# Patient Record
Sex: Female | Born: 1945
Health system: Southern US, Community
[De-identification: ages and names within clinical notes are randomized; demographics above are authoritative.]

## PROBLEM LIST (undated history)

## (undated) DIAGNOSIS — N72 Inflammatory disease of cervix uteri: Secondary | ICD-10-CM

## (undated) DIAGNOSIS — C50919 Malignant neoplasm of unspecified site of unspecified female breast: Secondary | ICD-10-CM

## (undated) DIAGNOSIS — R208 Other disturbances of skin sensation: Secondary | ICD-10-CM

## (undated) DIAGNOSIS — C50311 Malignant neoplasm of lower-inner quadrant of right female breast: Secondary | ICD-10-CM

## (undated) DIAGNOSIS — Z9289 Personal history of other medical treatment: Secondary | ICD-10-CM

## (undated) DIAGNOSIS — F419 Anxiety disorder, unspecified: Secondary | ICD-10-CM

## (undated) DIAGNOSIS — E785 Hyperlipidemia, unspecified: Secondary | ICD-10-CM

## (undated) DIAGNOSIS — I10 Essential (primary) hypertension: Secondary | ICD-10-CM

## (undated) DIAGNOSIS — Z780 Asymptomatic menopausal state: Secondary | ICD-10-CM

## (undated) HISTORY — DX: Essential (primary) hypertension: I10

## (undated) HISTORY — DX: Malignant neoplasm of lower-inner quadrant of right female breast: C50.311

## (undated) HISTORY — DX: Other disturbances of skin sensation: R20.8

## (undated) HISTORY — PX: OTHER SURGICAL HISTORY: SHX169

## (undated) HISTORY — DX: Personal history of other medical treatment: Z92.89

## (undated) HISTORY — DX: Hyperlipidemia, unspecified: E78.5

## (undated) HISTORY — DX: Asymptomatic menopausal state: Z78.0

## (undated) HISTORY — DX: Anxiety disorder, unspecified: F41.9

## (undated) HISTORY — DX: Inflammatory disease of cervix uteri: N72

---

## 1985-10-31 HISTORY — PX: DILATION AND CURETTAGE OF UTERUS: SHX78

## 1999-09-03 ENCOUNTER — Other Ambulatory Visit: Admission: RE | Admit: 1999-09-03 | Discharge: 1999-09-03 | Payer: Self-pay | Admitting: Family Medicine

## 2000-11-24 ENCOUNTER — Other Ambulatory Visit: Admission: RE | Admit: 2000-11-24 | Discharge: 2000-11-24 | Payer: Self-pay | Admitting: Family Medicine

## 2000-12-29 HISTORY — PX: COLONOSCOPY: SHX174

## 2001-01-15 ENCOUNTER — Other Ambulatory Visit: Admission: RE | Admit: 2001-01-15 | Discharge: 2001-01-15 | Payer: Self-pay | Admitting: Gastroenterology

## 2001-10-31 DIAGNOSIS — E785 Hyperlipidemia, unspecified: Secondary | ICD-10-CM

## 2001-10-31 HISTORY — DX: Hyperlipidemia, unspecified: E78.5

## 2002-12-20 ENCOUNTER — Other Ambulatory Visit: Admission: RE | Admit: 2002-12-20 | Discharge: 2002-12-20 | Payer: Self-pay | Admitting: Family Medicine

## 2003-12-26 ENCOUNTER — Other Ambulatory Visit: Admission: RE | Admit: 2003-12-26 | Discharge: 2003-12-26 | Payer: Self-pay | Admitting: Family Medicine

## 2004-12-29 ENCOUNTER — Encounter: Payer: Self-pay | Admitting: Family Medicine

## 2004-12-31 ENCOUNTER — Ambulatory Visit: Payer: Self-pay | Admitting: Family Medicine

## 2004-12-31 ENCOUNTER — Other Ambulatory Visit: Admission: RE | Admit: 2004-12-31 | Discharge: 2004-12-31 | Payer: Self-pay | Admitting: Family Medicine

## 2005-01-28 ENCOUNTER — Ambulatory Visit: Payer: Self-pay | Admitting: Family Medicine

## 2005-02-04 ENCOUNTER — Ambulatory Visit: Payer: Self-pay | Admitting: Family Medicine

## 2005-03-04 ENCOUNTER — Ambulatory Visit: Payer: Self-pay | Admitting: Family Medicine

## 2005-03-11 ENCOUNTER — Ambulatory Visit: Payer: Self-pay | Admitting: Family Medicine

## 2005-04-22 ENCOUNTER — Ambulatory Visit: Payer: Self-pay | Admitting: Family Medicine

## 2005-05-13 ENCOUNTER — Ambulatory Visit: Payer: Self-pay | Admitting: Family Medicine

## 2005-12-09 ENCOUNTER — Ambulatory Visit: Payer: Self-pay | Admitting: Family Medicine

## 2006-01-20 ENCOUNTER — Other Ambulatory Visit: Admission: RE | Admit: 2006-01-20 | Discharge: 2006-01-20 | Payer: Self-pay | Admitting: Family Medicine

## 2006-01-20 ENCOUNTER — Ambulatory Visit: Payer: Self-pay | Admitting: Family Medicine

## 2006-01-20 ENCOUNTER — Encounter: Payer: Self-pay | Admitting: Family Medicine

## 2006-02-22 ENCOUNTER — Ambulatory Visit: Payer: Self-pay | Admitting: Family Medicine

## 2006-03-14 ENCOUNTER — Ambulatory Visit: Payer: Self-pay | Admitting: Family Medicine

## 2006-03-21 ENCOUNTER — Ambulatory Visit: Payer: Self-pay | Admitting: Family Medicine

## 2006-03-29 ENCOUNTER — Ambulatory Visit: Payer: Self-pay | Admitting: Family Medicine

## 2006-03-29 DIAGNOSIS — Z9289 Personal history of other medical treatment: Secondary | ICD-10-CM

## 2006-03-29 HISTORY — DX: Personal history of other medical treatment: Z92.89

## 2006-04-04 ENCOUNTER — Ambulatory Visit: Payer: Self-pay | Admitting: Family Medicine

## 2006-04-21 ENCOUNTER — Ambulatory Visit: Payer: Self-pay | Admitting: Family Medicine

## 2006-04-27 ENCOUNTER — Ambulatory Visit: Payer: Self-pay | Admitting: Family Medicine

## 2006-05-26 ENCOUNTER — Ambulatory Visit: Payer: Self-pay | Admitting: Family Medicine

## 2006-06-09 ENCOUNTER — Ambulatory Visit: Payer: Self-pay | Admitting: Family Medicine

## 2006-06-12 ENCOUNTER — Ambulatory Visit: Payer: Self-pay | Admitting: Endocrinology

## 2006-07-07 ENCOUNTER — Ambulatory Visit: Payer: Self-pay | Admitting: Family Medicine

## 2006-09-29 ENCOUNTER — Ambulatory Visit: Payer: Self-pay | Admitting: Psychiatry

## 2007-01-12 ENCOUNTER — Encounter: Payer: Self-pay | Admitting: Family Medicine

## 2007-01-12 DIAGNOSIS — E785 Hyperlipidemia, unspecified: Secondary | ICD-10-CM

## 2007-01-12 DIAGNOSIS — I10 Essential (primary) hypertension: Secondary | ICD-10-CM

## 2007-01-12 DIAGNOSIS — F411 Generalized anxiety disorder: Secondary | ICD-10-CM | POA: Insufficient documentation

## 2007-01-12 DIAGNOSIS — G479 Sleep disorder, unspecified: Secondary | ICD-10-CM | POA: Insufficient documentation

## 2007-01-12 DIAGNOSIS — J309 Allergic rhinitis, unspecified: Secondary | ICD-10-CM | POA: Insufficient documentation

## 2007-01-12 DIAGNOSIS — M810 Age-related osteoporosis without current pathological fracture: Secondary | ICD-10-CM | POA: Insufficient documentation

## 2007-01-30 ENCOUNTER — Encounter: Payer: Self-pay | Admitting: Family Medicine

## 2007-02-16 ENCOUNTER — Ambulatory Visit: Payer: Self-pay | Admitting: Family Medicine

## 2007-02-16 LAB — CONVERTED CEMR LAB
ALT: 18 units/L (ref 0–40)
AST: 15 units/L (ref 0–37)
Albumin: 3.9 g/dL (ref 3.5–5.2)
Basophils Absolute: 0 10*3/uL (ref 0.0–0.1)
Calcium: 9.5 mg/dL (ref 8.4–10.5)
Chloride: 106 meq/L (ref 96–112)
Creatinine, Ser: 0.8 mg/dL (ref 0.4–1.2)
Direct LDL: 151.3 mg/dL
Eosinophils Relative: 2.2 % (ref 0.0–5.0)
Glucose, Bld: 106 mg/dL — ABNORMAL HIGH (ref 70–99)
HCT: 40.8 % (ref 36.0–46.0)
Neutrophils Relative %: 68.4 % (ref 43.0–77.0)
Platelets: 270 10*3/uL (ref 150–400)
RBC: 4.26 M/uL (ref 3.87–5.11)
RDW: 12 % (ref 11.5–14.6)
Sodium: 142 meq/L (ref 135–145)
Total Bilirubin: 0.8 mg/dL (ref 0.3–1.2)
Total CHOL/HDL Ratio: 4
Triglycerides: 107 mg/dL (ref 0–149)
WBC: 5 10*3/uL (ref 4.5–10.5)

## 2007-03-09 ENCOUNTER — Encounter: Payer: Self-pay | Admitting: Family Medicine

## 2007-03-12 ENCOUNTER — Encounter (INDEPENDENT_AMBULATORY_CARE_PROVIDER_SITE_OTHER): Payer: Self-pay | Admitting: *Deleted

## 2007-03-23 ENCOUNTER — Ambulatory Visit: Payer: Self-pay | Admitting: Family Medicine

## 2007-03-27 ENCOUNTER — Encounter (INDEPENDENT_AMBULATORY_CARE_PROVIDER_SITE_OTHER): Payer: Self-pay | Admitting: *Deleted

## 2007-03-27 LAB — CONVERTED CEMR LAB
OCCULT 2: NEGATIVE
OCCULT 3: NEGATIVE

## 2007-03-30 ENCOUNTER — Ambulatory Visit: Payer: Self-pay | Admitting: Family Medicine

## 2007-04-09 ENCOUNTER — Encounter (INDEPENDENT_AMBULATORY_CARE_PROVIDER_SITE_OTHER): Payer: Self-pay | Admitting: *Deleted

## 2007-07-12 ENCOUNTER — Encounter: Payer: Self-pay | Admitting: Endocrinology

## 2007-12-17 ENCOUNTER — Telehealth: Payer: Self-pay | Admitting: Family Medicine

## 2008-02-28 ENCOUNTER — Encounter: Payer: Self-pay | Admitting: Family Medicine

## 2008-02-28 DIAGNOSIS — M5137 Other intervertebral disc degeneration, lumbosacral region: Secondary | ICD-10-CM | POA: Insufficient documentation

## 2008-02-28 DIAGNOSIS — M51379 Other intervertebral disc degeneration, lumbosacral region without mention of lumbar back pain or lower extremity pain: Secondary | ICD-10-CM | POA: Insufficient documentation

## 2008-02-28 DIAGNOSIS — M169 Osteoarthritis of hip, unspecified: Secondary | ICD-10-CM

## 2008-02-28 DIAGNOSIS — R231 Pallor: Secondary | ICD-10-CM | POA: Insufficient documentation

## 2008-02-28 DIAGNOSIS — M1610 Unilateral primary osteoarthritis, unspecified hip: Secondary | ICD-10-CM | POA: Insufficient documentation

## 2008-02-29 ENCOUNTER — Telehealth: Payer: Self-pay | Admitting: Family Medicine

## 2008-02-29 ENCOUNTER — Other Ambulatory Visit: Admission: RE | Admit: 2008-02-29 | Discharge: 2008-02-29 | Payer: Self-pay | Admitting: Family Medicine

## 2008-02-29 ENCOUNTER — Ambulatory Visit: Payer: Self-pay | Admitting: Family Medicine

## 2008-02-29 ENCOUNTER — Encounter: Payer: Self-pay | Admitting: Family Medicine

## 2008-03-05 ENCOUNTER — Encounter (INDEPENDENT_AMBULATORY_CARE_PROVIDER_SITE_OTHER): Payer: Self-pay | Admitting: *Deleted

## 2008-03-05 LAB — CONVERTED CEMR LAB: Pap Smear: NORMAL

## 2008-03-06 ENCOUNTER — Encounter: Payer: Self-pay | Admitting: Family Medicine

## 2008-03-14 ENCOUNTER — Ambulatory Visit: Payer: Self-pay | Admitting: Orthopedic Surgery

## 2008-03-20 ENCOUNTER — Encounter: Payer: Self-pay | Admitting: Family Medicine

## 2008-04-04 ENCOUNTER — Ambulatory Visit: Payer: Self-pay | Admitting: Family Medicine

## 2008-04-04 ENCOUNTER — Encounter: Payer: Self-pay | Admitting: Family Medicine

## 2008-04-07 ENCOUNTER — Encounter (INDEPENDENT_AMBULATORY_CARE_PROVIDER_SITE_OTHER): Payer: Self-pay | Admitting: *Deleted

## 2008-05-21 ENCOUNTER — Encounter: Payer: Self-pay | Admitting: Family Medicine

## 2008-06-23 ENCOUNTER — Telehealth (INDEPENDENT_AMBULATORY_CARE_PROVIDER_SITE_OTHER): Payer: Self-pay | Admitting: *Deleted

## 2008-07-04 ENCOUNTER — Encounter: Payer: Self-pay | Admitting: Family Medicine

## 2008-12-30 ENCOUNTER — Telehealth: Payer: Self-pay | Admitting: Family Medicine

## 2009-06-05 ENCOUNTER — Encounter: Payer: Self-pay | Admitting: Family Medicine

## 2009-06-05 ENCOUNTER — Other Ambulatory Visit: Admission: RE | Admit: 2009-06-05 | Discharge: 2009-06-05 | Payer: Self-pay | Admitting: Family Medicine

## 2009-06-05 ENCOUNTER — Ambulatory Visit: Payer: Self-pay | Admitting: Family Medicine

## 2009-06-05 LAB — HM PAP SMEAR

## 2009-06-07 LAB — CONVERTED CEMR LAB: TSH: 1.208 microintl units/mL (ref 0.350–4.500)

## 2009-06-09 LAB — CONVERTED CEMR LAB
ALT: 26 units/L (ref 0–35)
Albumin: 4.7 g/dL (ref 3.5–5.2)
Basophils Absolute: 0 10*3/uL (ref 0.0–0.1)
Cholesterol: 283 mg/dL — ABNORMAL HIGH (ref 0–200)
HDL: 92 mg/dL (ref >39)
Indirect Bilirubin: 0.5 mg/dL (ref 0.0–0.9)
Lymphocytes Relative: 34 % (ref 12–46)
Lymphs Abs: 1.9 10*3/uL (ref 0.7–4.0)
Neutro Abs: 3.1 10*3/uL (ref 1.7–7.7)
Platelets: 291 10*3/uL (ref 150–400)
Potassium: 3.8 meq/L (ref 3.5–5.3)
RDW: 13.6 % (ref 11.5–15.5)
Sodium: 140 meq/L (ref 135–145)
Total CHOL/HDL Ratio: 3.1
Total Protein: 7.4 g/dL (ref 6.0–8.3)
Triglycerides: 88 mg/dL (ref <150)
VLDL: 18 mg/dL (ref 0–40)
WBC: 5.6 10*3/uL (ref 4.0–10.5)

## 2009-06-11 ENCOUNTER — Encounter (INDEPENDENT_AMBULATORY_CARE_PROVIDER_SITE_OTHER): Payer: Self-pay | Admitting: *Deleted

## 2009-07-03 ENCOUNTER — Ambulatory Visit: Payer: Self-pay | Admitting: Family Medicine

## 2009-07-03 ENCOUNTER — Encounter: Payer: Self-pay | Admitting: Family Medicine

## 2009-07-03 LAB — HM MAMMOGRAPHY: HM Mammogram: NEGATIVE

## 2009-07-09 ENCOUNTER — Encounter (INDEPENDENT_AMBULATORY_CARE_PROVIDER_SITE_OTHER): Payer: Self-pay | Admitting: *Deleted

## 2009-08-07 ENCOUNTER — Ambulatory Visit: Payer: Self-pay | Admitting: Family Medicine

## 2009-08-11 LAB — CONVERTED CEMR LAB
AST: 26 units/L (ref 0–37)
Cholesterol: 186 mg/dL (ref 0–200)
LDL Cholesterol: 84 mg/dL (ref 0–99)

## 2009-09-29 ENCOUNTER — Telehealth: Payer: Self-pay | Admitting: Family Medicine

## 2010-03-04 ENCOUNTER — Ambulatory Visit: Payer: Self-pay | Admitting: Family Medicine

## 2010-03-07 LAB — CONVERTED CEMR LAB
AST: 22 units/L (ref 0–37)
CO2: 30 meq/L (ref 19–32)
Chloride: 104 meq/L (ref 96–112)
Cholesterol: 193 mg/dL (ref 0–200)
GFR calc non Af Amer: 71.68 mL/min (ref >60)
Phosphorus: 4 mg/dL (ref 2.3–4.6)
Sodium: 143 meq/L (ref 135–145)
TSH: 2.09 microintl units/mL (ref 0.35–5.50)
VLDL: 30.6 mg/dL (ref 0.0–40.0)

## 2010-03-12 ENCOUNTER — Ambulatory Visit: Payer: Self-pay | Admitting: Family Medicine

## 2010-11-28 LAB — CONVERTED CEMR LAB
Albumin: 3.7 g/dL (ref 3.5–5.2)
Alkaline Phosphatase: 45 units/L (ref 39–117)
BUN: 17 mg/dL (ref 6–23)
Basophils Absolute: 0 10*3/uL (ref 0.0–0.1)
Blood in Urine, dipstick: NEGATIVE
Chloride: 99 meq/L (ref 96–112)
Cholesterol: 230 mg/dL (ref 0–200)
Creatinine, Ser: 0.8 mg/dL (ref 0.4–1.2)
Direct LDL: 154.7 mg/dL
Folate: 10.6 ng/mL
GFR calc Af Amer: 94 mL/min
GFR calc non Af Amer: 78 mL/min
Glucose, Urine, Semiquant: NEGATIVE
HDL: 52.7 mg/dL (ref >39.0)
Hemoglobin: 13.9 g/dL (ref 12.0–15.0)
Ketones, urine, test strip: NEGATIVE
Lymphocytes Relative: 24.7 % (ref 12.0–46.0)
MCHC: 33.3 g/dL (ref 30.0–36.0)
Monocytes Relative: 7.5 % (ref 3.0–12.0)
Neutro Abs: 3.6 10*3/uL (ref 1.4–7.7)
Nitrite: NEGATIVE
Platelets: 346 10*3/uL (ref 150–400)
Potassium: 3.7 meq/L (ref 3.5–5.1)
RBC / HPF: 0
RDW: 12.2 % (ref 11.5–14.6)
Total Bilirubin: 0.8 mg/dL (ref 0.3–1.2)
Total CHOL/HDL Ratio: 4.4
Triglycerides: 174 mg/dL — ABNORMAL HIGH (ref 0–149)
Urine crystals, microscopic: 0 /hpf
VLDL: 35 mg/dL (ref 0–40)
Vitamin B-12: 434 pg/mL (ref 211–911)
WBC Urine, dipstick: NEGATIVE
WBC, UA: 0 cells/hpf

## 2010-11-30 NOTE — Assessment & Plan Note (Signed)
Summary: 6 months fu /lsf  R/S FROM 02/17/10   Vital Signs:  Patient profile:   65 year old female Height:      63 inches Weight:      155.50 pounds BMI:     27.65 Temp:     98.3 degrees F oral Pulse rate:   76 / minute Pulse rhythm:   regular BP sitting:   154 / 80  (left arm) Cuff size:   regular  Vitals Entered By: Lewanda Rife LPN (Mar 12, 2010 8:40 AM)  Serial Vital Signs/Assessments:  Time      Position  BP       Pulse  Resp  Temp     By                     128/80                         Judith Part MD  Comments: at rest  By: Judith Part MD   CC: six month f/u after labs   History of Present Illness: here for 6 mo f/u  overall is feeling better  burining and itching are the same  tramadol helps  still cannot sleep   has tried otc melatonin / antihist cannot fall asleep and will wake up early  has had [place] before and it worked- wants to try that again   mood has been good  not as much exercise as she needs to - will get out more / and will walk and do yard work  thinking about going to mall    wt is stable today  bp is up with first reading 154/80-- was rushing this am   lipids even better with trig 153/ HDL 82 nad LDL 80-- on low dose zocor  diet is good too   sugar 108- is steering clear of sugar in diet      Allergies: 1)  ! Fosamax  Past History:  Past Medical History: Last updated: 02/29/2008 Allergic rhinitis Anxiety Hyperlipidemia 2003 Hypertension Osteoporosis 3/05 Menopause chronic condition of burning sensation of skin   Past Surgical History: Last updated: 02/28/2008 Negative renal ultrasound  D & C for DQB 1987 Chronic cervicitis with squamous metaplasia colonoscopy - polyps 3/02 Dexa - OP 4/05 Pelvic US WNL 03/29/06 Colonoscopy- neg (01/2004)  Family History: Last updated: 01/12/2007 Father: DM, HTN, CHF, CAD, died 74, ? 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 Eyes:  Denies blurring and eye irritation. CV:  Denies chest pain or discomfort, palpitations, shortness of breath with exertion, and swelling of feet. Resp:  Denies cough and wheezing. GI:  Denies abdominal pain, change in bowel habits, and indigestion. GU:  Denies urinary frequency. MS:  Denies joint redness, joint swelling, and muscle aches. Derm:  Denies itching, lesion(s), poor wound healing, and rash. Neuro:  Denies memory loss and tingling. Psych:  Denies anxiety and depression. Endo:  Complains of heat intolerance; denies cold intolerance, excessive thirst, and excessive urination. Heme:  Denies abnormal bruising and bleeding.  Physical Exam  General:  Well-developed,well-nourished,in no acute distress; alert,appropriate and  cooperative throughout examination Head:  normocephalic, atraumatic, and no abnormalities observed.   Eyes:  vision grossly intact, pupils equal, pupils round, and pupils reactive to light.   Mouth:  pharynx pink and moist.   Neck:  supple with full rom and no masses or thyromegally, no JVD or carotid bruit  Chest Wall:  No deformities, masses, or tenderness noted. Lungs:  Normal respiratory effort, chest expands symmetrically. Lungs are clear to auscultation, no crackles or wheezes. Heart:  Normal rate and regular rhythm. S1 and S2 normal without gallop, murmur, click, rub or other extra sounds. Abdomen:  Bowel sounds positive,abdomen soft and non-tender without masses, organomegaly or hernias noted. no renal bruits   Msk:  No deformity or scoliosis noted of thoracic or lumbar spine.  no acute joint changes  Pulses:  R and  L carotid,radial,femoral,dorsalis pedis and posterior tibial pulses are full and equal bilaterally Extremities:  No clubbing, cyanosis, edema, or deformity noted with normal full range of motion of all joints.   Neurologic:  sensation intact to light touch, gait normal, and DTRs symmetrical and normal.  no tremor  Skin:  Intact without suspicious lesions or rashes Cervical Nodes:  No lymphadenopathy noted Inguinal Nodes:  No significant adenopathy Psych:  normal affect, talkative and pleasant    Impression & Recommendations:  Problem # 1:  SLEEP DISORDER (ICD-780.50) Assessment Deteriorated  is persistant and daily -- really aff her lifestyle has failed all otc and herbal options and also good sleep hygiene  will try Remus Loffler (worked in past ) -- warned of sedation and habit forming potential  do not mix with tramadol   Orders: Prescription Created Electronically [id number])  Problem # 2:  HYPERTENSION (ICD-401.9) Assessment: Unchanged  control is better on second check  no change in meds  lab reviewed  urged to start exercise program  Her updated medication list for this problem includes:    Lotensin 40 Mg Tabs (Benazepril hcl) .Marland Kitchen... 1 by mouth once daily    Hydrochlorothiazide 12.5 Mg Tabs (Hydrochlorothiazide) .Marland Kitchen... Take 1 tablet by mouth once a  day    Verapamil Hcl Cr 240 Mg Tbcr (Verapamil hcl) .Marland Kitchen... 1 by mouth once daily  BP today: 154/80-- r e check at rest 128/80 Prior BP: 136/80 (06/05/2009)  Labs Reviewed: K+: 4.1 (03/04/2010) Creat: : 0.9 (03/04/2010)   Chol: 193 (03/04/2010)   HDL: 82.40 (03/04/2010)   LDL: 80 (03/04/2010)   TG: 153.0 (03/04/2010)  Orders: Prescription Created Electronically [phone])  Problem # 3:  HYPERLIPIDEMIA (ICD-272.4) Assessment: Improved  great control on statin and diet  keep it up  f/u is ok in 1 year rev lab  rev low sat fat diet in detail and questions answered  Her updated medication list for this problem includes:    Zocor 20  Mg Tabs (Simvastatin) .Marland Kitchen... 1 by mouth once daily  Labs Reviewed: SGOT: 22 (03/04/2010)   SGPT: 24 (03/04/2010)   HDL:82.40 (03/04/2010), 71.60 (08/07/2009)  LDL:80 (03/04/2010), 84 (08/07/2009)  Chol:193 (03/04/2010), 186 (08/07/2009)  Trig:153.0 (03/04/2010), 152.0 (08/07/2009)  Orders: Prescription Created Electronically [phone])  Complete Medication List: 1)  Lotensin 40 Mg Tabs (Benazepril hcl) .Marland Kitchen.. 1 by mouth once daily 2)  Hydrochlorothiazide 12.5 Mg Tabs (Hydrochlorothiazide) .... Take 1 tablet by mouth once a  day 3)  Sertraline Hcl 100 Mg Tabs (Sertraline hcl) .Marland Kitchen.. 1 by mouth once daily 4)  Tramadol Hcl 50 Mg Tabs (Tramadol hcl) .Marland Kitchen.. 1 by mouth three times a day as needed 5)  Verapamil Hcl  Cr 240 Mg Tbcr (Verapamil hcl) .Marland Kitchen.. 1 by mouth once daily 6)  Klor-con 10 10 Meq Tbcr (Potassium chloride) .... Take one by mouth daily 7)  Zocor 20 Mg Tabs (Simvastatin) .Marland Kitchen.. 1 by mouth once daily 8)  Ambien 10 Mg Tabs (Zolpidem tartrate) .Marland Kitchen.. 1 by mouth at bedtime as needed insomnia - do not mix with tramadol  Patient Instructions: 1)  use caution with ambien- do not mix with tramadol or other sedating medicines  2)  no other med changes  3)  get to regular exercise  4)  follow up for physical in about a year  Prescriptions: ZOCOR 20 MG TABS (SIMVASTATIN) 1 by mouth once daily  #30 x 11   Entered and Authorized by:   Judith Part MD   Signed by:   Judith Part MD on 03/12/2010   Method used:   Electronically to        [facility] [phone]* (retail)       [address] [place], [place]  [address]       Ph: [phone] or [phone]       Fax: [phone]   RxID:   [id number] KLOR-CON 10 10 MEQ  TBCR (POTASSIUM CHLORIDE) take one by mouth daily  #30 x 11   Entered and Authorized by:   Judith Part MD   Signed by:   Judith Part MD on 03/12/2010   Method used:   Electronically to        [facility] [phone]* (retail)       [address]  [place], [place]  [address]       Ph: [phone] or [phone]       Fax: [phone]   RxID:   [id number] VERAPAMIL HCL CR 240 MG TBCR (VERAPAMIL HCL) 1 by mouth once daily  #30 x 11   Entered and Authorized by:   Judith Part MD   Signed by:   Judith Part MD on 03/12/2010   Method used:   Electronically to        [facility] [phone]* (retail)       [address] [place], [place]  [address]       Ph: [phone] or [phone]       Fax: [phone]   RxID:   [id number] TRAMADOL HCL 50 MG TABS (TRAMADOL HCL) 1 by mouth three times a day as needed  #90 x 1   Entered and Authorized by:   Judith Part MD   Signed by:   Judith Part MD on 03/12/2010   Method used:   Electronically to        [facility] [phone]* (retail)       [address] [place], [place]  [address]       Ph: [phone] or [phone]       Fax: [phone]   RxID:   [id number] SERTRALINE HCL 100 MG TABS (SERTRALINE HCL) 1 by mouth once daily  #30 x 11   Entered and Authorized by:   Judith Part MD   Signed  by:   Judith Part MD on 03/12/2010   Method used:   Electronically to        [facility] [phone]* (retail)       [address] [place], [place]  [address]       Ph: [phone] or [phone]       Fax: [phone]   RxID:   [id number] HYDROCHLOROTHIAZIDE 12.5 MG TABS (HYDROCHLOROTHIAZIDE) Take 1 tablet by mouth once a  day  #30 x 11   Entered and Authorized by:   Judith Part MD   Signed by:   Judith Part MD on 03/12/2010   Method used:   Electronically to        [facility] [phone]* (retail)       [address] [place], [place]  [address]       Ph: [phone] or [phone]       Fax: [phone]   RxID:   [id number] LOTENSIN 40 MG TABS (BENAZEPRIL HCL) 1 by mouth once daily  #30 x 11   Entered and Authorized by:    Judith Part MD   Signed by:   Judith Part MD on 03/12/2010   Method used:   Electronically to        [facility] [phone]* (retail)       [address] [place], [place]  [address]       Ph: [phone] or [phone]       Fax: [phone]   RxID:   [id number] AMBIEN 10 MG TABS (ZOLPIDEM TARTRATE) 1 by mouth at bedtime as needed insomnia - do not mix with tramadol  #30 x 3   Entered and Authorized by:   Judith Part MD   Signed by:   Judith Part MD on 03/12/2010   Method used:   Print then Give to Patient   RxID:   [id number]   Current Allergies (reviewed today): ! FOSAMAX

## 2011-03-18 NOTE — Consult Note (Signed)
[facility]                            ENDOCRINOLOGY CONSULTATION   LAMA, NARAYANAN                        MRN:          [id number]  DATE:06/12/2006                            DOB:          [birth year]    REFERRING PHYSICIAN:  Dr. Milinda Antis.   REASON FOR REFERRAL:  Hypertension and symptoms.   HISTORY OF PRESENT ILLNESS:  A 65 year old woman with eight years of  stinging quality pain at her ears with associated excessive diaphoresis as  well as diffuse flushing of the skin.  She has some tremor.  In 2002, she  was treated with Prempro with only slight improvement.  Zoloft she states  did not help at all.   PAST MEDICAL HISTORY:  1. Hypertension.  2. Osteoporosis.  3. Menopause.   MEDICATIONS:  Benazepril/HCTZ, verapamil, Zoloft, Prempro, and Fosamax.   HABITS:  She drinks alcohol three to four drinks per week.   SOCIAL HISTORY:  She is married.  She is here with her husband. 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   FAMILY HISTORY:  Negative for above.   REVIEW OF SYSTEMS:  No change in her chronic insomnia.  She denies the  following:  Fever, weight gain, weight loss, headache, chest pain, nausea,  vomiting, syncope, anxiety and depression.   PHYSICAL EXAMINATION:  VITAL SIGNS:  Blood pressure 192/91, heart rate 98,  temperature 97.1, weight 155.  GENERAL:  No distress.  SKIN:  Not diaphoretic.  I do not see a rash.  There are no neurofibromata.  HEENT:  No proptosis, no periorbital swelling.  NECK:  No goiter.  CHEST:  Clear to auscultation. No respiratory distress.  CARDIOVASCULAR:  No JVD. No edema.  Regular rate and rhythm.  No murmur.  NEUROLOGIC:  Alert, well-oriented.  She appears slightly anxious but not  depressed.  There is no tremor.  ABDOMEN:  Soft, nontender.  No hepatosplenomegaly.  There is no mass.   LABORATORY DATA:  Laboratory studies forwarded by Dr. Milinda Antis, on January 20, 2006 CMET and CBC normal except  for fasting glucose of 114.  TSH 145.1.  On  April 21, 2006, TSH 145.4.   IMPRESSION:  1. Hypertension which is probably idiopathic.  2. Menopause.  3. Symptom complex of flushing and other symptoms as noted above, unlikely      to have an endocrine etiology.   PLAN:  1. A 24-hour urine for catecholamines and metanephrines.  2. Please make an appointment with Dr. Milinda Antis to further optimize the      control of your hypertension.  3. I advised her to continue symptomatic therapy.  She may even wish to      consider increasing the Zoloft.  4. She is advised to minimize alcohol consumption as this could worsen her      symptoms.  5. Return here p.r.n.                                   Sean A. Everardo All, MD  SAE/MedQ  DD:  06/14/2006  DT:  06/14/2006  Job #:  [id number]   cc:   Marne A. Milinda Antis, MD

## 2011-04-05 ENCOUNTER — Other Ambulatory Visit: Payer: Self-pay | Admitting: *Deleted

## 2011-04-05 NOTE — Telephone Encounter (Signed)
I put her letter in IN box-please go ahead and fill all of these until she has her appt this summer-thanks

## 2011-04-06 ENCOUNTER — Other Ambulatory Visit: Payer: Self-pay | Admitting: Family Medicine

## 2011-04-06 MED ORDER — SIMVASTATIN 20 MG PO TABS: 20.0000 mg | ORAL_TABLET | Freq: Every day | ORAL | Status: DC

## 2011-04-06 MED ORDER — SERTRALINE HCL 100 MG PO TABS: 100.0000 mg | ORAL_TABLET | Freq: Every day | ORAL | Status: DC

## 2011-04-06 NOTE — Telephone Encounter (Signed)
Benazepril, HCTZ and verapamil sent to [facility] [address] and Sertraline and Simvastatin sent to [facility]. As instructed.

## 2011-04-06 NOTE — Telephone Encounter (Signed)
Ok to refil all of these- she has upcoming appt with me in July Can give refils for the year- is ok

## 2011-05-04 ENCOUNTER — Encounter: Payer: Self-pay | Admitting: Family Medicine

## 2011-05-08 ENCOUNTER — Telehealth: Payer: Self-pay | Admitting: Family Medicine

## 2011-05-08 DIAGNOSIS — E785 Hyperlipidemia, unspecified: Secondary | ICD-10-CM

## 2011-05-08 DIAGNOSIS — I10 Essential (primary) hypertension: Secondary | ICD-10-CM

## 2011-05-08 DIAGNOSIS — Z0000 Encounter for general adult medical examination without abnormal findings: Secondary | ICD-10-CM | POA: Insufficient documentation

## 2011-05-08 NOTE — Telephone Encounter (Signed)
Message copied by Judy Pimple on Sun May 08, 2011 12:13 PM ------      Message from: Shannon Stephens      Created: Thu May 05, 2011 10:16 AM      Regarding: cpx labs mon       Please order  future cpx labs for pt's upcomming lab appt.      Thanks      Rodney Booze

## 2011-05-09 ENCOUNTER — Other Ambulatory Visit (INDEPENDENT_AMBULATORY_CARE_PROVIDER_SITE_OTHER): Payer: BC Managed Care – PPO | Admitting: Family Medicine

## 2011-05-09 DIAGNOSIS — E785 Hyperlipidemia, unspecified: Secondary | ICD-10-CM

## 2011-05-09 DIAGNOSIS — Z0000 Encounter for general adult medical examination without abnormal findings: Secondary | ICD-10-CM

## 2011-05-09 DIAGNOSIS — I10 Essential (primary) hypertension: Secondary | ICD-10-CM

## 2011-05-09 LAB — CBC WITH DIFFERENTIAL/PLATELET
Basophils Relative: 0.8 % (ref 0.0–3.0)
Eosinophils Absolute: 0.3 10*3/uL (ref 0.0–0.7)
HCT: 42 % (ref 36.0–46.0)
Hemoglobin: 14.5 g/dL (ref 12.0–15.0)
Lymphocytes Relative: 35.8 % (ref 12.0–46.0)
Lymphs Abs: 1.9 10*3/uL (ref 0.7–4.0)
MCHC: 34.4 g/dL (ref 30.0–36.0)
MCV: 100.4 fl — ABNORMAL HIGH (ref 78.0–100.0)
Neutro Abs: 2.7 10*3/uL (ref 1.4–7.7)
RBC: 4.19 Mil/uL (ref 3.87–5.11)

## 2011-05-09 LAB — COMPREHENSIVE METABOLIC PANEL
AST: 30 U/L (ref 0–37)
BUN: 20 mg/dL (ref 6–23)
Calcium: 9.8 mg/dL (ref 8.4–10.5)
Chloride: 104 mEq/L (ref 96–112)
Creatinine, Ser: 0.9 mg/dL (ref 0.4–1.2)
GFR: 68.61 mL/min (ref >60.00)

## 2011-05-09 LAB — LIPID PANEL
Cholesterol: 256 mg/dL — ABNORMAL HIGH (ref 0–200)
Triglycerides: 136 mg/dL (ref 0.0–149.0)

## 2011-05-13 ENCOUNTER — Ambulatory Visit (INDEPENDENT_AMBULATORY_CARE_PROVIDER_SITE_OTHER): Payer: BC Managed Care – PPO | Admitting: Family Medicine

## 2011-05-13 ENCOUNTER — Encounter: Payer: Self-pay | Admitting: Family Medicine

## 2011-05-13 ENCOUNTER — Other Ambulatory Visit (HOSPITAL_COMMUNITY)
Admission: RE | Admit: 2011-05-13 | Discharge: 2011-05-13 | Disposition: A | Discharge status: Home / Self Care | Payer: BC Managed Care – PPO | Source: Ambulatory Visit | Attending: Family Medicine | Admitting: Family Medicine

## 2011-05-13 DIAGNOSIS — Z01419 Encounter for gynecological examination (general) (routine) without abnormal findings: Secondary | ICD-10-CM

## 2011-05-13 DIAGNOSIS — I10 Essential (primary) hypertension: Secondary | ICD-10-CM

## 2011-05-13 DIAGNOSIS — R739 Hyperglycemia, unspecified: Secondary | ICD-10-CM

## 2011-05-13 DIAGNOSIS — M810 Age-related osteoporosis without current pathological fracture: Secondary | ICD-10-CM

## 2011-05-13 DIAGNOSIS — D7589 Other specified diseases of blood and blood-forming organs: Secondary | ICD-10-CM

## 2011-05-13 DIAGNOSIS — Z1159 Encounter for screening for other viral diseases: Secondary | ICD-10-CM | POA: Insufficient documentation

## 2011-05-13 DIAGNOSIS — R7303 Prediabetes: Secondary | ICD-10-CM | POA: Insufficient documentation

## 2011-05-13 DIAGNOSIS — M169 Osteoarthritis of hip, unspecified: Secondary | ICD-10-CM

## 2011-05-13 DIAGNOSIS — R7309 Other abnormal glucose: Secondary | ICD-10-CM

## 2011-05-13 DIAGNOSIS — F411 Generalized anxiety disorder: Secondary | ICD-10-CM

## 2011-05-13 DIAGNOSIS — E785 Hyperlipidemia, unspecified: Secondary | ICD-10-CM

## 2011-05-13 DIAGNOSIS — Z1231 Encounter for screening mammogram for malignant neoplasm of breast: Secondary | ICD-10-CM

## 2011-05-13 DIAGNOSIS — Z0000 Encounter for general adult medical examination without abnormal findings: Secondary | ICD-10-CM

## 2011-05-13 MED ORDER — SIMVASTATIN 20 MG PO TABS: 20.0000 mg | ORAL_TABLET | Freq: Every day | ORAL | Status: DC

## 2011-05-13 MED ORDER — VERAPAMIL HCL ER 240 MG PO TBCR: 240.0000 mg | EXTENDED_RELEASE_TABLET | Freq: Every day | ORAL | Status: DC

## 2011-05-13 MED ORDER — HYDROCHLOROTHIAZIDE 12.5 MG PO CAPS: 12.5000 mg | ORAL_CAPSULE | ORAL | Status: DC

## 2011-05-13 MED ORDER — ZOLPIDEM TARTRATE 10 MG PO TABS: 10.0000 mg | ORAL_TABLET | Freq: Every evening | ORAL | Status: DC | PRN

## 2011-05-13 MED ORDER — VENLAFAXINE HCL 75 MG PO TABS: 75.0000 mg | ORAL_TABLET | Freq: Two times a day (BID) | ORAL | Status: DC

## 2011-05-13 MED ORDER — BENAZEPRIL HCL 40 MG PO TABS: 40.0000 mg | ORAL_TABLET | Freq: Every day | ORAL | Status: DC

## 2011-05-13 NOTE — Assessment & Plan Note (Signed)
a1c today for fasting sugar in 130s Disc low glycemic diet  Rev at f/u

## 2011-05-13 NOTE — Assessment & Plan Note (Signed)
Due for dexa  Rev ca and D Needs exercise

## 2011-05-13 NOTE — Progress Notes (Signed)
Subjective:    Patient ID: Shannon Stephens, female    DOB: Jul 12, 1946, 65 y.o.   MRN: [id number]  HPI Here for health mt exam and to review chronic med problems  Not feeling well in general Very nervous and shaky and sweats - since she retired  Her skin burning is not as bad as it used to be  This was replaced by anxiety  Was not aware of it when working - because she was more   Had a problem with her L leg this year - pain in her R groin  Worse to put pressure on it    Some stress - her husband's prostate cancer re- occurred  He is tired a lot    Wt is down 10 lb Is not hungry - is shaky As a rule - little to no caffine    Zoster status never had the disease  Poss interested in vaccine in future   Td is 04  Pap 8/10 Hx of cervicitis in past - she does not remember that  No gyn problems or symptoms   colonosc nl 4/05  Lipids on zocor LDL up in to 150s Is creeping up slowly -- in general eats what she wants  Not eating much but when she does - not watching for sat fats Lab Results  Component Value Date   CHOL 256* 05/09/2011   CHOL 193 03/04/2010   CHOL 186 08/07/2009   Lab Results  Component Value Date   HDL 96.10 05/09/2011   HDL 82.40 03/04/2010   HDL 71.60 08/07/2009   Lab Results  Component Value Date   LDLCALC 80 03/04/2010   LDLCALC 84 08/07/2009   LDLCALC 173* 06/05/2009   Lab Results  Component Value Date   TRIG 136.0 05/09/2011   TRIG 153.0* 03/04/2010   TRIG 152.0* 08/07/2009   Lab Results  Component Value Date   CHOLHDL 3 05/09/2011   CHOLHDL 2 03/04/2010   CHOLHDL 3 08/07/2009   Lab Results  Component Value Date   LDLDIRECT 157.1 05/09/2011   LDLDIRECT 154.7 02/29/2008   LDLDIRECT 151.3 02/16/2007      Sugar is higher than usual at 121 Not eating more sugar than usual  Does have Dm in the family   Anxious today  Is on zoloft  HTN -- bp is high 178/92 today On ace and hctz and verapamil  Did not forget any meds today   OP in past - last dexa was  probably 2009 (last one was at [facility] )  Ca and D- is taking that regularly   Mam 9/10-- did not get one in 2011 and wants to schedule that  Self exam -no breast lumps   Patient Active Problem List  Diagnoses  . HYPERLIPIDEMIA  . ANXIETY  . HYPERTENSION  . ALLERGIC RHINITIS  . OSTEOARTHRITIS, HIP  . DISC DISEASE, LUMBAR  . OSTEOPOROSIS  . SLEEP DISORDER  . LIVEDO RETICULARIS  . Routine general medical examination at a health care facility  . Other screening mammogram  . Hyperglycemia  . Macrocytosis  . Gynecological examination   Past Medical History  Diagnosis Date  . Allergic rhinitis   . Anxiety   . Hyperlipidemia 2003  . Hypertension   . Osteoporosis 3/05  . Menopause   . Burning sensation     chronic,  skin  . History of ultrasound, pelvic 03/29/06    WNL  . Chronic cervicitis     with squamous metaplasia   Past  Surgical History  Procedure Date  . Dilation and curettage of uterus 1987    for DQB  . Colonoscopy 3/02    polyps  . Other surgical history     Neg renal ultrasound   History  Substance Use Topics  . Smoking status: Never Smoker   . Smokeless tobacco: Not on file  . Alcohol Use: Not on file   Family History  Problem Relation Age of Onset  . Diabetes Father   . Hypertension Father   . Heart failure Father   . Coronary artery disease Father   . Hypertension Mother    Allergies  Allergen Reactions  . Alendronate Sodium     REACTION: body pain- severe all over   Current Outpatient Prescriptions on File Prior to Visit  Medication Sig Dispense Refill  . Potassium Chloride (KLOR-CON 10 PO) Take 1 tablet by mouth daily.        . traMADol (ULTRAM) 50 MG tablet Take 50 mg by mouth every 8 (eight) hours as needed.               Review of Systems Review of Systems  Constitutional: Negative for fever, appetite change, and unexpected weight change. pos for fatigue Eyes: Negative for pain and visual disturbance.  Respiratory:  Negative for cough and shortness of breath.   Cardiovascular: Negative.  for cp or sob, but pos for palpitations if she is nervous Gastrointestinal: Negative for nausea, diarrhea and constipation.  Genitourinary: Negative for urgency and frequency.  Skin: Negative for pallor.  Neurological: Negative for weakness, light-headedness, numbness and headaches.  Hematological: Negative for adenopathy. Does not bruise/bleed easily.  Psychiatric/Behavioral: Negative for dysphoric mood. The patient c/o anxiety .          Objective:   Physical Exam  Constitutional: She appears well-developed and well-nourished. No distress.       Quite anxious   HENT:  Head: Normocephalic and atraumatic.  Right Ear: External ear normal.  Left Ear: External ear normal.  Mouth/Throat: Oropharynx is clear and moist.  Eyes: Conjunctivae and EOM are normal. Pupils are equal, round, and reactive to light.  Neck: Normal range of motion. Neck supple. No JVD present. Carotid bruit is not present. No thyromegaly present.  Cardiovascular: Normal rate, regular rhythm, normal heart sounds and intact distal pulses.   No murmur heard. Pulmonary/Chest: Effort normal and breath sounds normal. No respiratory distress. She has no wheezes.  Abdominal: Soft. Bowel sounds are normal. She exhibits no distension, no abdominal bruit and no mass. There is no tenderness.  Genitourinary: Vagina normal and uterus normal. No breast swelling, tenderness, discharge or bleeding. No vaginal discharge found.  Musculoskeletal: Normal range of motion. She exhibits no edema and no tenderness.  Lymphadenopathy:    She has no cervical adenopathy.  Neurological: She is alert. She has normal reflexes. No cranial nerve deficit. Coordination normal.  Skin: Skin is warm and dry. No rash noted. No erythema. No pallor.  Psychiatric:       Extremely anxious with tremor that calmed down after sitting for a while occ tearful          Assessment &  Plan:

## 2011-05-13 NOTE — Assessment & Plan Note (Signed)
Reviewed health habits including diet and exercise and skin cancer prevention Also reviewed health mt list, fam hx and immunizations  Rev wellness labs today   

## 2011-05-13 NOTE — Assessment & Plan Note (Signed)
Direct LDL is high - but not really watching diet  Will work on low sat fat diet and if not imp change statin

## 2011-05-13 NOTE — Assessment & Plan Note (Signed)
bp is very high but pt is quite nervous  She states this fluctuates with mood  Will f/u 2-3 wk to see if imp with less anx  Consider further w/u or tx if not imp

## 2011-05-13 NOTE — Assessment & Plan Note (Signed)
Screen exam with pap today No problems or symptoms

## 2011-05-13 NOTE — Assessment & Plan Note (Signed)
Pain in alternate hip L now- which we will address at f/u

## 2011-05-13 NOTE — Assessment & Plan Note (Signed)
This is quite a bit worse with retirement and some situational stress Change from zoloft to effexor short acting  F/u 2-3 week If worse or depression adv to stop med and update  Disc poss side eff Consider psychiatry consult if not imp

## 2011-05-13 NOTE — Assessment & Plan Note (Signed)
Nl exam sched mam

## 2011-05-13 NOTE — Assessment & Plan Note (Signed)
Mild without significant alcohol hx  Check b12 level today

## 2011-05-13 NOTE — Patient Instructions (Signed)
Stop zoloft Take effexor 75 mg 1/2 pill twice daily for 5-7 days and then increase to 1 pill twice daily  If you have side effects or get worse - stop it and let me know  We will schedule mammogram and dexa at check out  If you are interested in shingles vaccine in future - call your insurance company to see how coverage is and call us to schedule  B12 level and A1c labs today  Work on low fat and low sugar diet  Follow up with me in about 2-3 weeks

## 2011-06-01 ENCOUNTER — Ambulatory Visit: Payer: Self-pay | Admitting: Family Medicine

## 2011-06-01 ENCOUNTER — Encounter: Payer: Self-pay | Admitting: Family Medicine

## 2011-06-01 ENCOUNTER — Ambulatory Visit (INDEPENDENT_AMBULATORY_CARE_PROVIDER_SITE_OTHER): Payer: BC Managed Care – PPO | Admitting: Family Medicine

## 2011-06-01 DIAGNOSIS — D7589 Other specified diseases of blood and blood-forming organs: Secondary | ICD-10-CM

## 2011-06-01 DIAGNOSIS — F411 Generalized anxiety disorder: Secondary | ICD-10-CM

## 2011-06-01 DIAGNOSIS — R739 Hyperglycemia, unspecified: Secondary | ICD-10-CM

## 2011-06-01 DIAGNOSIS — I10 Essential (primary) hypertension: Secondary | ICD-10-CM

## 2011-06-01 DIAGNOSIS — R7309 Other abnormal glucose: Secondary | ICD-10-CM

## 2011-06-01 MED ORDER — TRAMADOL HCL 50 MG PO TABS: 50.0000 mg | ORAL_TABLET | Freq: Three times a day (TID) | ORAL | Status: DC | PRN

## 2011-06-01 NOTE — Assessment & Plan Note (Signed)
a1c is below 6 Disc low glycemic diet

## 2011-06-01 NOTE — Assessment & Plan Note (Signed)
Much imp with effexor Continue this  Pleased Enc activity/ exercise

## 2011-06-01 NOTE — Progress Notes (Signed)
Subjective:    Patient ID: Shannon Stephens, female    DOB: June 12, 1946, 65 y.o.   MRN: [id number]  HPI Here for f/u of anxiety and HTN and  Hyperglycemia   anx - changed to effexor short acting  Is incredibly better - lots of energy Back to her old self  Husband is thrilled too No side eff of effexor at all  Appetite back No more queasiness   HTN better 138/78 today- less anx No cp or palpitatoins   Just got back from doing the bone density test   A1c 5.6- very reassuring  B12 normal - also reassuring  Pt does not drink alcohol Will watch the mcv  Patient Active Problem List  Diagnoses  . HYPERLIPIDEMIA  . ANXIETY  . HYPERTENSION  . ALLERGIC RHINITIS  . OSTEOARTHRITIS, HIP  . DISC DISEASE, LUMBAR  . OSTEOPOROSIS  . SLEEP DISORDER  . LIVEDO RETICULARIS  . Routine general medical examination at a health care facility  . Other screening mammogram  . Hyperglycemia  . Macrocytosis  . Gynecological examination   Past Medical History  Diagnosis Date  . Allergic rhinitis   . Anxiety   . Hyperlipidemia 2003  . Hypertension   . Osteoporosis 3/05  . Menopause   . Burning sensation     chronic,  skin  . History of ultrasound, pelvic 03/29/06    WNL  . Chronic cervicitis     with squamous metaplasia   Past Surgical History  Procedure Date  . Dilation and curettage of uterus 1987    for DQB  . Colonoscopy 3/02    polyps  . Other surgical history     Neg renal ultrasound   History  Substance Use Topics  . Smoking status: Never Smoker   . Smokeless tobacco: Not on file  . Alcohol Use: Not on file   Family History  Problem Relation Age of Onset  . Diabetes Father   . Hypertension Father   . Heart failure Father   . Coronary artery disease Father   . Hypertension Mother    Allergies  Allergen Reactions  . Alendronate Sodium     REACTION: body pain- severe all over   Current Outpatient Prescriptions on File Prior to Visit  Medication Sig Dispense Refill   . benazepril (LOTENSIN) 40 MG tablet Take 1 tablet (40 mg total) by mouth daily.  30 tablet  11  . Calcium Carbonate-Vitamin D 600-400 MG-UNIT per tablet Take 2 tablets by mouth daily.        . hydrochlorothiazide (,MICROZIDE/HYDRODIURIL,) 12.5 MG capsule Take 1 capsule (12.5 mg total) by mouth every morning.  30 capsule  11  . Potassium Chloride (KLOR-CON 10 PO) Take 1 tablet by mouth daily.        . simvastatin (ZOCOR) 20 MG tablet Take 1 tablet (20 mg total) by mouth at bedtime.  30 tablet  11  . venlafaxine (EFFEXOR) 75 MG tablet Take 1 tablet (75 mg total) by mouth 2 (two) times daily.  60 tablet  2  . verapamil (CALAN-SR) 240 MG CR tablet Take 1 tablet (240 mg total) by mouth at bedtime.  30 tablet  11  . zolpidem (AMBIEN) 10 MG tablet Take 1 tablet (10 mg total) by mouth at bedtime as needed. For insomnia- do not mix with tramadol  30 tablet  5       Review of Systems Review of Systems  Constitutional: Negative for fever, appetite change, fatigue and unexpected weight change.  Eyes: Negative for pain and visual disturbance.  Respiratory: Negative for cough and shortness of breath.   Cardiovascular: Negative.  for cp or palp Gastrointestinal: Negative for nausea, diarrhea and constipation.  Genitourinary: Negative for urgency and frequency.  Skin: Negative for pallor. no rash Neurological: Negative for weakness, light-headedness, numbness and headaches.  Hematological: Negative for adenopathy. Does not bruise/bleed easily.  Psychiatric/Behavioral: Negative for dysphoric mood. Anxiety is much improved        Objective:   Physical Exam  Constitutional: She appears well-developed and well-nourished. No distress.  HENT:  Head: Normocephalic and atraumatic.  Mouth/Throat: Oropharynx is clear and moist.  Eyes: Conjunctivae and EOM are normal. Pupils are equal, round, and reactive to light.  Neck: Normal range of motion. Neck supple. No JVD present. Carotid bruit is not present.  No thyromegaly present.  Cardiovascular: Normal rate, regular rhythm, normal heart sounds and intact distal pulses.   Pulmonary/Chest: Effort normal and breath sounds normal. No respiratory distress. She has no wheezes.  Abdominal: Soft.  Musculoskeletal: She exhibits no edema and no tenderness.  Lymphadenopathy:    She has no cervical adenopathy.  Neurological: She is alert. She has normal reflexes. She exhibits normal muscle tone. Coordination normal.       Tremor is improved Has a baseline mild tremor of head and hands   Skin: Skin is warm and dry. No rash noted. No erythema. No pallor.  Psychiatric: She has a normal mood and affect.       Much improved- not anxious Is cheerful/ relaxed and talkative today          Assessment & Plan:

## 2011-06-01 NOTE — Assessment & Plan Note (Signed)
Nl B12 level Will continue to follow

## 2011-06-01 NOTE — Assessment & Plan Note (Signed)
Much improved with better mood and less anx  Was pleased F/u 6 mo

## 2011-06-01 NOTE — Patient Instructions (Signed)
I sent tramadol to your pharmacy  Watch sugar in diet - not a diabetic - good news Glad mood is better  Stay active  No change in medicines  Follow up in about 6 months

## 2011-06-02 ENCOUNTER — Encounter: Payer: Self-pay | Admitting: Family Medicine

## 2011-06-02 ENCOUNTER — Telehealth: Payer: Self-pay

## 2011-06-02 NOTE — Telephone Encounter (Signed)
Letter mailed to patient as instructed for pap results.Health maintenance updated.

## 2011-06-02 NOTE — Telephone Encounter (Signed)
Message copied by Patience Musca on Thu Jun 02, 2011  9:00 AM ------      Message from: Roxy Manns A      Created: Thu May 26, 2011  8:51 AM       Pap is normal       Please adv pt       Note nl pap on health mt sheet

## 2011-06-09 ENCOUNTER — Telehealth: Payer: Self-pay

## 2011-06-09 NOTE — Telephone Encounter (Signed)
Opened phone note to update history and problem list to remove osteoporosis as instructed.

## 2011-06-23 ENCOUNTER — Ambulatory Visit: Payer: Self-pay | Admitting: Family Medicine

## 2011-06-24 ENCOUNTER — Encounter: Payer: Self-pay | Admitting: Family Medicine

## 2011-06-28 ENCOUNTER — Encounter: Payer: Self-pay | Admitting: *Deleted

## 2011-08-24 ENCOUNTER — Other Ambulatory Visit: Payer: Self-pay | Admitting: Family Medicine

## 2011-08-24 NOTE — Telephone Encounter (Signed)
Will refill electronically  

## 2011-08-24 NOTE — Telephone Encounter (Signed)
[facility] [address] request refill Effexor 75 mg. Last filled 07/23/11.Please advise.

## 2011-11-17 ENCOUNTER — Other Ambulatory Visit: Payer: Self-pay

## 2011-11-17 MED ORDER — ZOLPIDEM TARTRATE 10 MG PO TABS: 10.0000 mg | ORAL_TABLET | Freq: Every evening | ORAL | Status: DC | PRN

## 2011-11-17 NOTE — Telephone Encounter (Signed)
Medication phoned to [facility] as instructed.Patient notified as instructed by telephone med was called in.

## 2011-11-17 NOTE — Telephone Encounter (Signed)
Px written for call in   

## 2011-11-17 NOTE — Telephone Encounter (Signed)
[facility] request refill Zolpidem 10 mg. Pt last seen 06/01/11.Please advise.

## 2011-12-05 ENCOUNTER — Ambulatory Visit: Payer: BC Managed Care – PPO | Admitting: Family Medicine

## 2012-01-16 ENCOUNTER — Other Ambulatory Visit: Payer: Self-pay | Admitting: Family Medicine

## 2012-01-16 NOTE — Telephone Encounter (Signed)
[facility] [address] request refill Tramadol 50 mg. Pt last seen 06/01/11.Please advise.

## 2012-01-16 NOTE — Telephone Encounter (Signed)
Will refill electronically  

## 2012-01-17 ENCOUNTER — Ambulatory Visit (INDEPENDENT_AMBULATORY_CARE_PROVIDER_SITE_OTHER): Payer: Medicare Other | Admitting: Family Medicine

## 2012-01-17 ENCOUNTER — Encounter: Payer: Self-pay | Admitting: Family Medicine

## 2012-01-17 VITALS — BP 180/92 | HR 82 | Temp 98.2°F | Ht 64.0 in | Wt 154.1 lb

## 2012-01-17 DIAGNOSIS — R9431 Abnormal electrocardiogram [ECG] [EKG]: Secondary | ICD-10-CM | POA: Diagnosis not present

## 2012-01-17 DIAGNOSIS — F411 Generalized anxiety disorder: Secondary | ICD-10-CM

## 2012-01-17 DIAGNOSIS — I10 Essential (primary) hypertension: Secondary | ICD-10-CM

## 2012-01-17 LAB — COMPREHENSIVE METABOLIC PANEL
AST: 48 U/L — ABNORMAL HIGH (ref 0–37)
Albumin: 4.5 g/dL (ref 3.5–5.2)
Alkaline Phosphatase: 69 U/L (ref 39–117)
Potassium: 3.6 mEq/L (ref 3.5–5.1)
Sodium: 142 mEq/L (ref 135–145)
Total Protein: 8 g/dL (ref 6.0–8.3)

## 2012-01-17 MED ORDER — BUSPIRONE HCL 15 MG PO TABS: 15.0000 mg | ORAL_TABLET | Freq: Two times a day (BID) | ORAL | Status: DC

## 2012-01-17 MED ORDER — HYDROCHLOROTHIAZIDE 25 MG PO TABS: 25.0000 mg | ORAL_TABLET | Freq: Every day | ORAL | Status: DC

## 2012-01-17 NOTE — Progress Notes (Signed)
Subjective:    Patient ID: Shannon Stephens, female    DOB: Jul 23, 1946, 66 y.o.   MRN: [id number]  HPI 66 yo pt of Dr. Milinda Antis new to me here for worsening anxiety and HTN.  Notes reviewed.  Anxiety - on effexor since August (previously on Zoloft). Feels that since 07/2011, she is more anxious and constantly shaking. She thinks this is why her BP is elevated. Denies feeling depressed. Was taking care of her husband during his cancer treatment but he is doing well.  HTN- On Benazapril 40 mg daily, HCTZ 12.5 mg daily and Verapamil 240 mg daily. No cp or palpitatoins but wants an EKG done today. Wants to make sure her heart is ok. Has home BP cuff and BP has been elevated as high as 200s systolic. Lab Results  Component Value Date   CREATININE 0.9 05/09/2011      Patient Active Problem List  Diagnoses  . HYPERLIPIDEMIA  . ANXIETY  . HYPERTENSION  . ALLERGIC RHINITIS  . DISC DISEASE, LUMBAR  . SLEEP DISORDER  . LIVEDO RETICULARIS  . Routine general medical examination at a health care facility  . Other screening mammogram  . Hyperglycemia  . Macrocytosis  . Gynecological examination   Past Medical History  Diagnosis Date  . Allergic rhinitis   . Anxiety   . Hyperlipidemia 2003  . Hypertension   . Menopause   . Burning sensation     chronic,  skin  . History of ultrasound, pelvic 03/29/06    WNL  . Chronic cervicitis     with squamous metaplasia   Past Surgical History  Procedure Date  . Dilation and curettage of uterus 1987    for DQB  . Colonoscopy 3/02    polyps  . Other surgical history     Neg renal ultrasound   History  Substance Use Topics  . Smoking status: Never Smoker   . Smokeless tobacco: Not on file  . Alcohol Use: Not on file   Family History  Problem Relation Age of Onset  . Diabetes Father   . Hypertension Father   . Heart failure Father   . Coronary artery disease Father   . Hypertension Mother    Allergies  Allergen Reactions  .  Alendronate Sodium     REACTION: body pain- severe all over   Current Outpatient Prescriptions on File Prior to Visit  Medication Sig Dispense Refill  . benazepril (LOTENSIN) 40 MG tablet Take 1 tablet (40 mg total) by mouth daily.  30 tablet  11  . Calcium Carbonate-Vitamin D 600-400 MG-UNIT per tablet Take 2 tablets by mouth daily.        . hydrochlorothiazide (,MICROZIDE/HYDRODIURIL,) 12.5 MG capsule Take 1 capsule (12.5 mg total) by mouth every morning.  30 capsule  11  . Potassium Chloride (KLOR-CON 10 PO) Take 1 tablet by mouth daily.        . simvastatin (ZOCOR) 20 MG tablet Take 1 tablet (20 mg total) by mouth at bedtime.  30 tablet  11  . traMADol (ULTRAM) 50 MG tablet TAKE ONE TABLET BY MOUTH EVERY 8 HOURS AS NEEDED  30 tablet  1  . venlafaxine (EFFEXOR) 75 MG tablet TAKE ONE TABLET BY MOUTH TWICE DAILY  60 tablet  11  . verapamil (CALAN-SR) 240 MG CR tablet Take 1 tablet (240 mg total) by mouth at bedtime.  30 tablet  11  . zolpidem (AMBIEN) 10 MG tablet Take 1 tablet (10 mg total) by mouth  at bedtime as needed. For insomnia- do not mix with tramadol  30 tablet  5       Review of Systems See HPI Denies any symptoms of hypo or hyperthyroidism.       Objective:   Physical Exam  BP 180/92  Pulse 82  Temp(Src) 98.2 F (36.8 C) (Oral)  Ht 5\' 4"  (1.626 m)  Wt 154 lb 1.9 oz (69.908 kg)  BMI 26.45 kg/m2  SpO2 98% BP Readings from Last 3 Encounters:  01/17/12 180/92  06/01/11 138/78  05/13/11 178/92            Constitutional: She appears well-developed and well-nourished. No distress.  HENT:  Head: Normocephalic and atraumatic.  Mouth/Throat: Oropharynx is clear and moist.  Eyes: Conjunctivae and EOM are normal. Pupils are equal, round, and reactive to light.  Neck: Normal range of motion. Neck supple. No JVD present. Carotid bruit is not present. No thyromegaly present.  Cardiovascular: Normal rate, regular rhythm, normal heart sounds and intact distal pulses.     Pulmonary/Chest: Effort normal and breath sounds normal. No respiratory distress. She has no wheezes.  Abdominal: Soft.  Musculoskeletal: She exhibits no edema and no tenderness.  Lymphadenopathy:    She has no cervical adenopathy.  Neurological: She is alert. She has normal reflexes. She exhibits normal muscle tone. Coordination normal.       Tremor is hands and head Skin: Skin is warm and dry. No rash noted. No erythema. No pallor.  Psychiatric: She has a normal mood and affect.  Very Anxious      Assessment & Plan:   1. HYPERTENSION  >45 min spent reviewing patients chart and symptoms along with new treatment plan with pt and her husband. Deteriorated.  Unclear if due to anxiety but it is extremely elevated. I do not see renal ultrasound- likely needs one if BP remains poorly controlled. Increase HCTZ to 25 mg daily. Consider bisoprolol. Continue current dose of other meds. Check CMET and TSH today. Refer to cardiology for further management and work up ? Stress test (abnormal EKG).  2. ANXIETY  Deteriorated. Wean off of effexor and start Buspar- intially 7.5 mg twice daily and increase to 15 mg twice daily after a few days. Follow up with Dr. Milinda Antis in 2 weeks.

## 2012-01-17 NOTE — Patient Instructions (Addendum)
Wean off of effexor- 1 tablet every other day for 2 weeks and stop. Go ahead and start Buspar today- ok to cut them half for a couple of days (7.5 twice a day). We have increased your HCTZ to 25 mg daily (stop taking the 12.5 mg daily dosage). Please follow up with Dr. Milinda Antis in 2 weeks. Please stop by to see Shirlee Limerick on your way out to set up your cardiology referral.

## 2012-02-03 ENCOUNTER — Encounter: Payer: Self-pay | Admitting: Cardiovascular Disease

## 2012-02-03 ENCOUNTER — Ambulatory Visit (INDEPENDENT_AMBULATORY_CARE_PROVIDER_SITE_OTHER): Payer: Medicare Other | Admitting: Cardiovascular Disease

## 2012-02-03 VITALS — BP 168/98 | HR 105 | Ht 63.0 in | Wt 154.0 lb

## 2012-02-03 DIAGNOSIS — Z79899 Other long term (current) drug therapy: Secondary | ICD-10-CM

## 2012-02-03 DIAGNOSIS — I10 Essential (primary) hypertension: Secondary | ICD-10-CM | POA: Diagnosis not present

## 2012-02-03 DIAGNOSIS — R9431 Abnormal electrocardiogram [ECG] [EKG]: Secondary | ICD-10-CM

## 2012-02-03 MED ORDER — CARVEDILOL 25 MG PO TABS: 25.0000 mg | ORAL_TABLET | Freq: Two times a day (BID) | ORAL | Status: DC

## 2012-02-03 NOTE — Patient Instructions (Addendum)
Stop Verapamil. Start Coreg 25 mg take one tablet twice a day.  Need to have an ECHO. Need to have a renal artery u/s doppler.  Follow up after test with Dr. Kirke Corin  Need to have an aldosterone level done at [facility].

## 2012-02-03 NOTE — Progress Notes (Signed)
HPI  This is a 66 year old female who is referred by Dr. Dayton Martes for evaluation of refractory hypertension and an abnormal ECG. The patient has no previous reported cardiac history. She reports prolonged history of hypertension at an early age which has been always difficult to control. She complains of frequent headache. She denies any chest pain or dyspnea. At her most recent visit her systolic blood pressure was 180. The dose of hydrochlorothiazide was increased to 25 mg once daily. She is not aware of a history of sleep apnea. However, she really does not get much sleep at night at all. She sleeps for only a few hours. She always feels hyperstimulated. She has frequent palpitations and tachycardia. She has been on verapamil for many years. She has been having problems with photosensitivity recently. She is not aware of any previous workup for secondary hypertension.  Allergies  Allergen Reactions  . Alendronate Sodium     REACTION: body pain- severe all over     Current Outpatient Prescriptions on File Prior to Visit  Medication Sig Dispense Refill  . benazepril (LOTENSIN) 40 MG tablet Take 1 tablet (40 mg total) by mouth daily.  30 tablet  11  . busPIRone (BUSPAR) 15 MG tablet Take 1 tablet (15 mg total) by mouth 2 (two) times daily.  60 tablet  3  . Calcium Carbonate-Vitamin D 600-400 MG-UNIT per tablet Take 2 tablets by mouth daily.        . hydrochlorothiazide (HYDRODIURIL) 25 MG tablet Take 1 tablet (25 mg total) by mouth daily.  30 tablet  6  . simvastatin (ZOCOR) 20 MG tablet Take 1 tablet (20 mg total) by mouth at bedtime.  30 tablet  11  . traMADol (ULTRAM) 50 MG tablet TAKE ONE TABLET BY MOUTH EVERY 8 HOURS AS NEEDED  30 tablet  1  . zolpidem (AMBIEN) 10 MG tablet Take 1 tablet (10 mg total) by mouth at bedtime as needed. For insomnia- do not mix with tramadol  30 tablet  5  . carvedilol (COREG) 25 MG tablet Take 1 tablet (25 mg total) by mouth 2 (two) times daily.  60 tablet  3       Past Medical History  Diagnosis Date  . Allergic rhinitis   . Anxiety   . Hyperlipidemia 2003  . Hypertension   . Menopause   . Burning sensation     chronic,  skin  . History of ultrasound, pelvic 03/29/06    WNL  . Chronic cervicitis     with squamous metaplasia     Past Surgical History  Procedure Date  . Dilation and curettage of uterus 1987    for DQB  . Colonoscopy 3/02    polyps  . Other surgical history     Neg renal ultrasound     Family History  Problem Relation Age of Onset  . Diabetes Father   . Hypertension Father   . Heart failure Father   . Coronary artery disease Father   . Hypertension Mother      History   Social History  . Marital Status: Married    Spouse Name: N/A    Number of Children: 0  . Years of Education: N/A   Occupational History  . Librarian, academic    Social History Main Topics  . Smoking status: Never Smoker   . Smokeless tobacco: Not on file  . Alcohol Use: Not on file  . Drug Use: Not on file  . Sexually Active:  Not on file   Other Topics Concern  . Not on file   Social History Narrative   Husband had MI and surgery, lots of stress, husband has prostate caHas 2 sisters     ROS Constitutional: Negative for fever, chills, diaphoresis, activity change, appetite change and fatigue.  HENT: Negative for hearing loss, nosebleeds, congestion, sore throat, facial swelling, drooling, trouble swallowing, neck pain, voice change, sinus pressure and tinnitus.  Eyes: Negative for photophobia, pain, discharge and visual disturbance.  Respiratory: Negative for apnea, cough, chest tightness, shortness of breath and wheezing.  Cardiovascular: Negative for chest pain, palpitations and leg swelling.  Gastrointestinal: Negative for nausea, vomiting, abdominal pain, diarrhea, constipation, blood in stool and abdominal distention.  Genitourinary: Negative for dysuria, urgency, frequency, hematuria and decreased urine volume.   Musculoskeletal: Negative for myalgias, back pain, joint swelling, arthralgias and gait problem.  Skin: Negative for color change, pallor, rash and wound.  Neurological: Negative for dizziness, tremors, seizures, syncope, speech difficulty, weakness, light-headedness, numbness. Psychiatric/Behavioral: Negative for suicidal ideas, hallucinations, behavioral problems and agitation. The patient is not nervous/anxious.     PHYSICAL EXAM   BP 168/98  Pulse 105  Ht 5\' 3"  (1.6 m)  Wt 154 lb (69.854 kg)  BMI 27.28 kg/m2  Constitutional: She is oriented to person, place, and time. She appears well-developed and well-nourished. No distress.  HENT: No nasal discharge.  Head: Normocephalic and atraumatic.  Eyes: Pupils are equal and round. Right eye exhibits no discharge. Left eye exhibits no discharge.  Neck: Normal range of motion. Neck supple. No JVD present. No thyromegaly present.  Cardiovascular: Normal rate, regular rhythm, normal heart sounds. Exam reveals no gallop and no friction rub. No murmur heard.  Pulmonary/Chest: Effort normal and breath sounds normal. No stridor. No respiratory distress. She has no wheezes. She has no rales. She exhibits no tenderness.  Abdominal: Soft. Bowel sounds are normal. She exhibits no distension. There is no tenderness. There is no rebound and no guarding.  Musculoskeletal: Normal range of motion. She exhibits no edema and no tenderness.  Neurological: She is alert and oriented to person, place, and time. Coordination normal.  Skin: Skin is warm and dry. No rash noted. She is not diaphoretic. No erythema. No pallor.  Psychiatric: She has a normal mood and affect. Her behavior is normal. Judgment and thought content normal.     EKG: Her recent ECG was reviewed which showed normal sinus rhythm with left atrial enlargement. No significant ST or T wave changes.   ASSESSMENT AND PLAN

## 2012-02-03 NOTE — Assessment & Plan Note (Signed)
Shannon Stephens seems to have refractory hypertension with difficult to control blood pressure for many years. She is on 3 different blood pressure medications without optimal control. It appears that her home blood pressure readings have improved significantly after the increase in hydrochlorothiazide to 25 mg once daily. She might have a component of whitecoat syndrome. She had recent labs done which showed normal kidney function and electrolytes as well as TSH. I think she should be screened for secondary hypertension. Thus, I I will check aldosterone/renin ratio. I do think that the poor quality of sleep that she describes, is contributing to her uncontrolled hypertension. I also want to screen her for possible renal artery stenosis and thus will request renal artery ultrasound with Doppler. In terms of her medications, I do think she would benefit from a beta blocker due to her symptoms of palpitations and stress. I also would like to get her off verapamil given the interaction with multiple medications especially with simvastatin. Thus, today I will stop verapamil and start him on carvedilol 25 mg twice daily. I explained to her to notify us if she develops any rebound tachycardia. If her blood pressure remains uncontrolled, other considerations include adding amlodipine or spironolactone which is particularly helpful in cases of refractory hypertension.

## 2012-02-03 NOTE — Assessment & Plan Note (Signed)
This is likely reflective of some degree of hypertensive heart disease. I do not see signs of ischemia. She also does not have symptoms suggestive of angina. She is highly active and reports no exertional symptoms. I will obtain an echocardiogram to evaluate her LV systolic function and look for left ventricular hypertrophy and possible valvular abnormalities. Given that she does not have symptoms of for cardiac ischemia, we'll hold off on obtaining a stress test.

## 2012-02-06 ENCOUNTER — Telehealth: Payer: Self-pay | Admitting: Family Medicine

## 2012-02-06 NOTE — Telephone Encounter (Signed)
Caller: Jashayla/Patient; PCP: Tower, Marne A.; CB#: [phone];  Call regarding Patient changed from Effexor to Buspar. Anxiety has increased over the past 2-3 d. Feels like she could crawl out of her skin. Appt sched for 0815 02/07/12 with Dr. Milinda Antis. Anxiety Protocol.

## 2012-02-06 NOTE — Telephone Encounter (Signed)
Will see her as planned  

## 2012-02-07 ENCOUNTER — Encounter: Payer: Self-pay | Admitting: Family Medicine

## 2012-02-07 ENCOUNTER — Ambulatory Visit (INDEPENDENT_AMBULATORY_CARE_PROVIDER_SITE_OTHER): Payer: Medicare Other | Admitting: Family Medicine

## 2012-02-07 VITALS — BP 140/80 | HR 93 | Temp 98.3°F | Ht 63.0 in | Wt 152.8 lb

## 2012-02-07 DIAGNOSIS — I10 Essential (primary) hypertension: Secondary | ICD-10-CM

## 2012-02-07 DIAGNOSIS — F411 Generalized anxiety disorder: Secondary | ICD-10-CM | POA: Diagnosis not present

## 2012-02-07 MED ORDER — POTASSIUM CHLORIDE ER 10 MEQ PO TBCR: 10.0000 meq | EXTENDED_RELEASE_TABLET | Freq: Every day | ORAL | Status: DC

## 2012-02-07 NOTE — Patient Instructions (Signed)
Stay off the effexor and buspar  Take tramadol for pain as needed - if you feel you are using it regularly- we need to think about trying another anxiety medicine Please consider going to a counselor- I strongly feel that this is an important part of your treatment for anxiety as a whole Keep exercising  Follow up with me about 2 months I'm glad bp is looking better

## 2012-02-07 NOTE — Assessment & Plan Note (Signed)
Much better with current regimen Also watching pulse  In midst of cardiac w/u also  F/u 2 mo

## 2012-02-07 NOTE — Assessment & Plan Note (Addendum)
Pt feels better off effexor-that "took away her energy"  buspar made her anx worse At this point - she just wants to be off med for a while (uses tramadol for her burning pain and that helps prn)  Also coreg helps her HR too  Will watch use of ultram  If anx re occurs - may consider pristiq or cymbalta or psychiatry Strongly recommended counseling - pt is resistant to that  >25 min spent with face to face with patient, >50% counseling and/or coordinating care

## 2012-02-07 NOTE — Progress Notes (Signed)
Subjective:    Patient ID: Shannon Stephens, female    DOB: [birth year], 66 y.o.   MRN: [id number]  HPI Here for anxiety  Saw Dr Dayton Martes last time -- bp was high and she was extremely nervous Inc her hctz from 12.5 to 25  Took her off her effexor -- ? Aff bp and also also giving her trouble sleeping  Switched her out to buspar  This made her chronic stinging / burning worse -- and also made her anx worse -- became afraid of the dark and night time   Stress level is fair - husb has cancer but is doing well (since 2004)   Stopped it - feels some better   More energy off the effexor overall   Took some tramadol for pain yesterday --and it helped her burning and stinging (which is the physical symptom she gets when her anxiety is high)  In past zoloft did not work very well   bp is   140/80  Today-- this is much better  No cp or palpitations or headaches or edema  No side effects to medicines    Saw cardiology -- has chronic high pulse  He gave her coreg - this helps too    Chemistry      Component Value Date/Time   NA 142 01/17/2012 1104   K 3.6 01/17/2012 1104   CL 101 01/17/2012 1104   CO2 27 01/17/2012 1104   BUN 17 01/17/2012 1104   CREATININE 0.9 01/17/2012 1104      Component Value Date/Time   CALCIUM 9.9 01/17/2012 1104   ALKPHOS 69 01/17/2012 1104   AST 48* 01/17/2012 1104   ALT 55* 01/17/2012 1104   BILITOT 0.5 01/17/2012 1104      Patient Active Problem List  Diagnoses  . HYPERLIPIDEMIA  . ANXIETY  . HYPERTENSION  . ALLERGIC RHINITIS  . DISC DISEASE, LUMBAR  . SLEEP DISORDER  . LIVEDO RETICULARIS  . Routine general medical examination at a health care facility  . Other screening mammogram  . Hyperglycemia  . Macrocytosis  . Gynecological examination  . Abnormal EKG   Past Medical History  Diagnosis Date  . Allergic rhinitis   . Anxiety   . Hyperlipidemia 2003  . Hypertension   . Menopause   . Burning sensation     chronic,  skin  . History of ultrasound,  pelvic 03/29/06    WNL  . Chronic cervicitis     with squamous metaplasia   Past Surgical History  Procedure Date  . Dilation and curettage of uterus 1987    for DQB  . Colonoscopy 3/02    polyps  . Other surgical history     Neg renal ultrasound   History  Substance Use Topics  . Smoking status: Never Smoker   . Smokeless tobacco: Not on file  . Alcohol Use: Not on file   Family History  Problem Relation Age of Onset  . Diabetes Father   . Hypertension Father   . Heart failure Father   . Coronary artery disease Father   . Hypertension Mother    Allergies  Allergen Reactions  . Alendronate Sodium     REACTION: body pain- severe all over   Current Outpatient Prescriptions on File Prior to Visit  Medication Sig Dispense Refill  . benazepril (LOTENSIN) 40 MG tablet Take 1 tablet (40 mg total) by mouth daily.  30 tablet  11  . Calcium Carbonate-Vitamin D 600-400 MG-UNIT per tablet Take  2 tablets by mouth daily.        . carvedilol (COREG) 25 MG tablet Take 1 tablet (25 mg total) by mouth 2 (two) times daily.  60 tablet  3  . hydrochlorothiazide (HYDRODIURIL) 25 MG tablet Take 1 tablet (25 mg total) by mouth daily.  30 tablet  6  . simvastatin (ZOCOR) 20 MG tablet Take 1 tablet (20 mg total) by mouth at bedtime.  30 tablet  11  . traMADol (ULTRAM) 50 MG tablet TAKE ONE TABLET BY MOUTH EVERY 8 HOURS AS NEEDED  30 tablet  1  . zolpidem (AMBIEN) 10 MG tablet Take 1 tablet (10 mg total) by mouth at bedtime as needed. For insomnia- do not mix with tramadol  30 tablet  5  . DULoxetine (CYMBALTA) 30 MG capsule Take 1 capsule (30 mg total) by mouth daily.  30 capsule  11      Review of Systems Review of Systems  Constitutional: Negative for fever, appetite change, fatigue and unexpected weight change.  Eyes: Negative for pain and visual disturbance.  Respiratory: Negative for cough and shortness of breath.   Cardiovascular: Negative for cp or palpitations    Gastrointestinal:  Negative for nausea, diarrhea and constipation.  Genitourinary: Negative for urgency and frequency.  Skin: Negative for pallor or rash   Neurological: Negative for weakness, light-headedness, numbness and headaches. pos for burning / tingling sensation of the skin  Hematological: Negative for adenopathy. Does not bruise/bleed easily.  Psychiatric/Behavioral: Negative for dysphoric mood. Pos for anxiety          Objective:   Physical Exam  Constitutional: She appears well-developed and well-nourished. No distress.  HENT:  Head: Normocephalic and atraumatic.  Mouth/Throat: Oropharynx is clear and moist.  Eyes: Conjunctivae and EOM are normal. Pupils are equal, round, and reactive to light. No scleral icterus.  Neck: Normal range of motion. Neck supple. No JVD present. Carotid bruit is not present. No thyromegaly present.  Cardiovascular: Normal rate, regular rhythm and normal heart sounds.   Pulmonary/Chest: Effort normal and breath sounds normal. No respiratory distress. She has no wheezes.  Musculoskeletal: She exhibits no edema and no tenderness.  Lymphadenopathy:    She has no cervical adenopathy.  Neurological: She is alert. She has normal strength and normal reflexes. She displays no atrophy and no tremor. No cranial nerve deficit or sensory deficit. She exhibits normal muscle tone. Coordination normal.  Skin: Skin is warm and dry. No rash noted. No erythema. No pallor.  Psychiatric: Her behavior is normal. Judgment and thought content normal. Her mood appears anxious. Her affect is not angry, not blunt, not labile and not inappropriate. Her speech is not rapid and/or pressured and not delayed. She is not agitated and not withdrawn. Cognition and memory are normal. She does not exhibit a depressed mood. She expresses no homicidal and no suicidal ideation.          Assessment & Plan:

## 2012-02-09 ENCOUNTER — Telehealth: Payer: Self-pay | Admitting: Family Medicine

## 2012-02-09 MED ORDER — DULOXETINE HCL 30 MG PO CPEP: 30.0000 mg | ORAL_CAPSULE | Freq: Every day | ORAL | Status: DC

## 2012-02-09 NOTE — Telephone Encounter (Signed)
Spoke with patient via telephone and she would like something called to [employer].  Please advise.

## 2012-02-09 NOTE — Telephone Encounter (Signed)
Follow up with me in about 3-4 weeks please

## 2012-02-09 NOTE — Telephone Encounter (Signed)
Please call her - when we spoke in the office she did not want to take any med for this besides her pain med- ask her if she has changed her mind

## 2012-02-09 NOTE — Telephone Encounter (Signed)
Triage Record Num: [id number] Operator: Lyn Hollingshead Patient Name: Shannon Stephens Call Date & Time: 02/09/2012 10:40:02AM Patient Phone: [phone] PCP: Audrie Gallus. Tower Patient Gender: Female PCP Fax : Patient DOB: May 04, 1946 Practice Name: [facility] Reason for Call: Caller: Shannon Stephens/Patient; PCP: Shannon Manns A.; CB#: [phone] or [phone]; ; PLEASE CALL PT-; Call regarding Anxiety Attacks Having Trouble Going Upstairs At Night; Pt is feeling nervous, jittery, and claustrophic. It eases some with walking and yard work during the day but nothing helps at night. She was seen on Tuesday 02/07/12 and is not on any meds at this point for Anxiety. Buspar and Effexor tried previously. Emergent s/s of Anxiety protocol r/o. Call provider within 12hrs, message sent to office. Pt uses [facility] [address] Protocol(s) Used: Anxiety: Panic Recommended Outcome per Protocol: Call Provider within 12 Hours Reason for Outcome: History of panic attacks AND current episode NOT resolved Care Advice: ~ Continue to follow treatment plan, including medications, until evaluated by provider. Distraction through talking with friends, listening to music, writing, going for a walk or ride can help relieve symptoms. ~ Avoid the use of stimulants including caffeine (coffee, some soft drinks, some energy drinks, tea and chocolate), cocaine, and amphetamines. Also avoid drinking alcohol. ~ ~ Call provider immediately if behavior becomes a threat to self or others. 02/09/2012 11:06:26AM Page 1 of 1 [id number]

## 2012-02-09 NOTE — Telephone Encounter (Signed)
Addended by: Roxy Manns A on: 02/09/2012 03:55 PM   Modules accepted: Orders

## 2012-02-09 NOTE — Telephone Encounter (Signed)
Patient advised as instructed via telephone, she will pick up Rx today and keep [place] posted.  She will call back to scheduled f/u appt.

## 2012-02-09 NOTE — Telephone Encounter (Signed)
Will try cymbalta- let her know it is in the same class as effexor - but I hope the side effects will be less Will start at lowest dose and then f/u with me If side effects or any depression or worse anxiety - let me know Have her husband watch her closely while initiating this med Will refill electronically

## 2012-02-28 ENCOUNTER — Other Ambulatory Visit (INDEPENDENT_AMBULATORY_CARE_PROVIDER_SITE_OTHER): Payer: Medicare Other

## 2012-02-28 ENCOUNTER — Other Ambulatory Visit: Payer: Self-pay

## 2012-02-28 DIAGNOSIS — I10 Essential (primary) hypertension: Secondary | ICD-10-CM

## 2012-02-28 DIAGNOSIS — I059 Rheumatic mitral valve disease, unspecified: Secondary | ICD-10-CM

## 2012-02-28 DIAGNOSIS — R9431 Abnormal electrocardiogram [ECG] [EKG]: Secondary | ICD-10-CM

## 2012-03-06 ENCOUNTER — Encounter (INDEPENDENT_AMBULATORY_CARE_PROVIDER_SITE_OTHER): Payer: Medicare Other

## 2012-03-06 DIAGNOSIS — I10 Essential (primary) hypertension: Secondary | ICD-10-CM

## 2012-03-08 ENCOUNTER — Ambulatory Visit (INDEPENDENT_AMBULATORY_CARE_PROVIDER_SITE_OTHER): Payer: Medicare Other | Admitting: Cardiovascular Disease

## 2012-03-08 ENCOUNTER — Encounter: Payer: Self-pay | Admitting: Cardiovascular Disease

## 2012-03-08 VITALS — BP 150/100 | HR 90 | Ht 64.0 in | Wt 144.8 lb

## 2012-03-08 DIAGNOSIS — R9431 Abnormal electrocardiogram [ECG] [EKG]: Secondary | ICD-10-CM | POA: Diagnosis not present

## 2012-03-08 DIAGNOSIS — F411 Generalized anxiety disorder: Secondary | ICD-10-CM | POA: Diagnosis not present

## 2012-03-08 DIAGNOSIS — I10 Essential (primary) hypertension: Secondary | ICD-10-CM | POA: Diagnosis not present

## 2012-03-08 MED ORDER — ALPRAZOLAM 0.25 MG PO TABS: 0.2500 mg | ORAL_TABLET | Freq: Three times a day (TID) | ORAL | Status: AC | PRN

## 2012-03-08 NOTE — Assessment & Plan Note (Signed)
I do think she has a component of white coat syndrome. Her home blood pressure readings are completely normal. She has no evidence of renal artery stenosis. Her blood pressure is much better controlled with carvedilol which will be continued.

## 2012-03-08 NOTE — Patient Instructions (Signed)
Start Xanax as needed.  Continue all other medications.  Your heart tests were fine.  Follow up in 6 months.

## 2012-03-08 NOTE — Assessment & Plan Note (Signed)
The patient reports continued symptoms of anxiety which seems to be contributing to her labile hypertension.  Her symptoms did not improve with Cymbalta.  I will give her a short-term course of small dose Xanax as needed to evaluate her response.

## 2012-03-08 NOTE — Progress Notes (Signed)
HPI  This is a 66 year old female who is here for a followup regarding refractory hypertension and an abnormal ECG. During her last visit, I switched her from verapamil to carvedilol 25 mg twice daily.  she was referred for renal artery duplex ultrasound which showed no evidence of renal artery stenosis. She had an echocardiogram done which showed normal LV systolic function, mild diastolic dysfunction, mild mitral regurgitation and no evidence of pulmonary hypertension. Overall, she feels better. Her blood pressure has improved significantly. All her home blood pressure readings have been less than 140 systolic. She actually had one reading yesterday was less than 100 systolic. She was asymptomatic. She reports episodes of anxiety and almost panic attacks. She was started on Cymbalta without significant improvement.  Allergies  Allergen Reactions  . Alendronate Sodium     REACTION: body pain- severe all over     Current Outpatient Prescriptions on File Prior to Visit  Medication Sig Dispense Refill  . benazepril (LOTENSIN) 40 MG tablet Take 1 tablet (40 mg total) by mouth daily.  30 tablet  11  . Calcium Carbonate-Vitamin D 600-400 MG-UNIT per tablet Take 2 tablets by mouth daily.        . carvedilol (COREG) 25 MG tablet Take 1 tablet (25 mg total) by mouth 2 (two) times daily.  60 tablet  3  . DULoxetine (CYMBALTA) 30 MG capsule Take 1 capsule (30 mg total) by mouth daily.  30 capsule  11  . hydrochlorothiazide (HYDRODIURIL) 25 MG tablet Take 1 tablet (25 mg total) by mouth daily.  30 tablet  6  . potassium chloride (KLOR-CON 10) 10 MEQ tablet Take 1 tablet (10 mEq total) by mouth daily.  30 tablet  11  . simvastatin (ZOCOR) 20 MG tablet Take 1 tablet (20 mg total) by mouth at bedtime.  30 tablet  11  . traMADol (ULTRAM) 50 MG tablet TAKE ONE TABLET BY MOUTH EVERY 8 HOURS AS NEEDED  30 tablet  1  . zolpidem (AMBIEN) 10 MG tablet Take 1 tablet (10 mg total) by mouth at bedtime as needed.  For insomnia- do not mix with tramadol  30 tablet  5     Past Medical History  Diagnosis Date  . Allergic rhinitis   . Anxiety   . Hyperlipidemia 2003  . Hypertension   . Menopause   . Burning sensation     chronic,  skin  . History of ultrasound, pelvic 03/29/06    WNL  . Chronic cervicitis     with squamous metaplasia     Past Surgical History  Procedure Date  . Dilation and curettage of uterus 1987    for DQB  . Colonoscopy 3/02    polyps  . Other surgical history     Neg renal ultrasound     Family History  Problem Relation Age of Onset  . Diabetes Father   . Hypertension Father   . Heart failure Father   . Coronary artery disease Father   . Hypertension Mother      History   Social History  . Marital Status: Married    Spouse Name: N/A    Number of Children: 0  . Years of Education: N/A   Occupational History  . Librarian, academic    Social History Main Topics  . Smoking status: Never Smoker   . Smokeless tobacco: Not on file  . Alcohol Use: Yes  . Drug Use: Not on file  . Sexually Active: Not on  file   Other Topics Concern  . Not on file   Social History Narrative   Husband had MI and surgery, lots of stress, husband has prostate caHas 2 sisters     PHYSICAL EXAM   BP 150/100  Pulse 90  Ht 5\' 4"  (1.626 m)  Wt 144 lb 12.8 oz (65.681 kg)  BMI 24.85 kg/m2  Constitutional: She is oriented to person, place, and time. She appears well-developed and well-nourished. No distress.  HENT: No nasal discharge.  Head: Normocephalic and atraumatic.  Eyes: Pupils are equal and round. Right eye exhibits no discharge. Left eye exhibits no discharge.  Neck: Normal range of motion. Neck supple. No JVD present. No thyromegaly present.  Cardiovascular: Normal rate, regular rhythm, normal heart sounds. Exam reveals no gallop and no friction rub. No murmur heard.  Pulmonary/Chest: Effort normal and breath sounds normal. No stridor. No respiratory distress.  She has no wheezes. She has no rales. She exhibits no tenderness.  Abdominal: Soft. Bowel sounds are normal. She exhibits no distension. There is no tenderness. There is no rebound and no guarding.  Musculoskeletal: Normal range of motion. She exhibits no edema and no tenderness.  Neurological: She is alert and oriented to person, place, and time. Coordination normal.  Skin: Skin is warm and dry. No rash noted. She is not diaphoretic. No erythema. No pallor.  Psychiatric: She has a normal mood and affect. Her behavior is normal. Judgment and thought content normal.    EKG: Normal sinus rhythm with left atrial enlargement. Nonspecific T wave changes.   ASSESSMENT AND PLAN

## 2012-03-08 NOTE — Assessment & Plan Note (Signed)
This is likely reflective of some degree of hypertensive heart disease. I do not see signs of ischemia. She also does not have symptoms suggestive of angina.  Echo showed normal LV systolic function with mild diastolic dysfunction and mild mitral regurgitation. No further workup for this as needed.

## 2012-04-26 DIAGNOSIS — R002 Palpitations: Secondary | ICD-10-CM | POA: Diagnosis not present

## 2012-04-26 DIAGNOSIS — E889 Metabolic disorder, unspecified: Secondary | ICD-10-CM | POA: Diagnosis not present

## 2012-04-26 DIAGNOSIS — D531 Other megaloblastic anemias, not elsewhere classified: Secondary | ICD-10-CM | POA: Diagnosis not present

## 2012-04-26 DIAGNOSIS — I10 Essential (primary) hypertension: Secondary | ICD-10-CM | POA: Diagnosis not present

## 2012-04-26 DIAGNOSIS — F4001 Agoraphobia with panic disorder: Secondary | ICD-10-CM | POA: Diagnosis not present

## 2012-04-26 DIAGNOSIS — F411 Generalized anxiety disorder: Secondary | ICD-10-CM | POA: Diagnosis not present

## 2012-05-01 DIAGNOSIS — F4001 Agoraphobia with panic disorder: Secondary | ICD-10-CM | POA: Diagnosis not present

## 2012-05-11 DIAGNOSIS — E876 Hypokalemia: Secondary | ICD-10-CM | POA: Diagnosis not present

## 2012-05-11 DIAGNOSIS — R5381 Other malaise: Secondary | ICD-10-CM | POA: Diagnosis not present

## 2012-05-14 ENCOUNTER — Telehealth: Payer: Self-pay | Admitting: Family Medicine

## 2012-05-14 MED ORDER — BENAZEPRIL HCL 40 MG PO TABS: 40.0000 mg | ORAL_TABLET | Freq: Every day | ORAL | Status: DC

## 2012-05-14 NOTE — Telephone Encounter (Signed)
Pt is needing refill on Benazpril 40 mg she uses [facility] on [address].

## 2012-05-14 NOTE — Telephone Encounter (Signed)
Will refill electronically  Let her know it is done

## 2012-05-14 NOTE — Telephone Encounter (Signed)
Left message on patient vm about Rx sent to pharmacy

## 2012-05-15 DIAGNOSIS — F4001 Agoraphobia with panic disorder: Secondary | ICD-10-CM | POA: Diagnosis not present

## 2012-06-14 DIAGNOSIS — F4001 Agoraphobia with panic disorder: Secondary | ICD-10-CM | POA: Diagnosis not present

## 2012-10-02 DIAGNOSIS — F4001 Agoraphobia with panic disorder: Secondary | ICD-10-CM | POA: Diagnosis not present

## 2012-10-09 ENCOUNTER — Encounter: Payer: Self-pay | Admitting: Family Medicine

## 2012-10-09 ENCOUNTER — Ambulatory Visit (INDEPENDENT_AMBULATORY_CARE_PROVIDER_SITE_OTHER): Payer: Medicare Other | Admitting: Family Medicine

## 2012-10-09 VITALS — BP 106/62 | HR 73 | Temp 97.8°F | Ht 62.75 in | Wt 142.5 lb

## 2012-10-09 DIAGNOSIS — I10 Essential (primary) hypertension: Secondary | ICD-10-CM

## 2012-10-09 DIAGNOSIS — R7309 Other abnormal glucose: Secondary | ICD-10-CM | POA: Diagnosis not present

## 2012-10-09 DIAGNOSIS — R739 Hyperglycemia, unspecified: Secondary | ICD-10-CM

## 2012-10-09 DIAGNOSIS — Z23 Encounter for immunization: Secondary | ICD-10-CM

## 2012-10-09 DIAGNOSIS — E785 Hyperlipidemia, unspecified: Secondary | ICD-10-CM | POA: Diagnosis not present

## 2012-10-09 LAB — LIPID PANEL
Cholesterol: 193 mg/dL (ref 0–200)
HDL: 51 mg/dL (ref >39.00)
LDL Cholesterol: 111 mg/dL — ABNORMAL HIGH (ref 0–99)
VLDL: 30.6 mg/dL (ref 0.0–40.0)

## 2012-10-09 LAB — CBC WITH DIFFERENTIAL/PLATELET
Basophils Relative: 0.8 % (ref 0.0–3.0)
Eosinophils Absolute: 0.5 10*3/uL (ref 0.0–0.7)
Eosinophils Relative: 11 % — ABNORMAL HIGH (ref 0.0–5.0)
HCT: 42.3 % (ref 36.0–46.0)
Lymphs Abs: 1.5 10*3/uL (ref 0.7–4.0)
MCHC: 33.1 g/dL (ref 30.0–36.0)
MCV: 93.6 fl (ref 78.0–100.0)
Monocytes Absolute: 0.3 10*3/uL (ref 0.1–1.0)
Neutrophils Relative %: 48 % (ref 43.0–77.0)
Platelets: 253 10*3/uL (ref 150.0–400.0)
WBC: 4.3 10*3/uL — ABNORMAL LOW (ref 4.5–10.5)

## 2012-10-09 LAB — COMPREHENSIVE METABOLIC PANEL
AST: 22 U/L (ref 0–37)
Alkaline Phosphatase: 52 U/L (ref 39–117)
BUN: 27 mg/dL — ABNORMAL HIGH (ref 6–23)
Glucose, Bld: 114 mg/dL — ABNORMAL HIGH (ref 70–99)
Potassium: 3 mEq/L — ABNORMAL LOW (ref 3.5–5.1)
Sodium: 138 mEq/L (ref 135–145)
Total Bilirubin: 0.8 mg/dL (ref 0.3–1.2)

## 2012-10-09 LAB — TSH: TSH: 0.66 u[IU]/mL (ref 0.35–5.50)

## 2012-10-09 MED ORDER — CARVEDILOL 25 MG PO TABS: 25.0000 mg | ORAL_TABLET | Freq: Two times a day (BID) | ORAL | Status: DC

## 2012-10-09 MED ORDER — SIMVASTATIN 20 MG PO TABS: 20.0000 mg | ORAL_TABLET | Freq: Every day | ORAL | Status: DC

## 2012-10-09 MED ORDER — BENAZEPRIL HCL 40 MG PO TABS: 40.0000 mg | ORAL_TABLET | Freq: Every day | ORAL | Status: DC

## 2012-10-09 MED ORDER — HYDROCHLOROTHIAZIDE 25 MG PO TABS: 25.0000 mg | ORAL_TABLET | Freq: Every day | ORAL | Status: DC

## 2012-10-09 MED ORDER — POTASSIUM CHLORIDE ER 10 MEQ PO TBCR: 10.0000 meq | EXTENDED_RELEASE_TABLET | Freq: Every day | ORAL | Status: AC

## 2012-10-09 NOTE — Progress Notes (Signed)
Subjective:    Patient ID: Shannon Stephens, female    DOB: [birth year], 66 y.o.   MRN: [id number]  HPI Here for check up of chronic medical conditions and to review health mt list  ] Wt is down 2 lb with bmi of 25  Lot of stress - dealing with it  Husband now has a colostomy and a urinary cath  He can only stand and walk a little  Other than that - is doing ok and keeping a positive attitude    bp is stable today  No cp or palpitations or headaches or edema  No side effects to medicines  BP Readings from Last 3 Encounters:  10/09/12 106/62  03/08/12 150/100  02/07/12 140/80    She saw cardiology - at [facility]- changed to cavedilol - doing much better   Hyperlipidemia Lab Results  Component Value Date   CHOL 256* 05/09/2011   CHOL 193 03/04/2010   CHOL 186 08/07/2009   Lab Results  Component Value Date   HDL 96.10 05/09/2011   HDL 82.40 03/04/2010   HDL 71.60 08/07/2009   Lab Results  Component Value Date   LDLCALC 80 03/04/2010   LDLCALC 84 08/07/2009   LDLCALC 173* 06/05/2009   Lab Results  Component Value Date   TRIG 136.0 05/09/2011   TRIG 153.0* 03/04/2010   TRIG 152.0* 08/07/2009   Lab Results  Component Value Date   CHOLHDL 3 05/09/2011   CHOLHDL 2 03/04/2010   CHOLHDL 3 08/07/2009   Lab Results  Component Value Date   LDLDIRECT 157.1 05/09/2011   LDLDIRECT 154.7 02/29/2008   LDLDIRECT 151.3 02/16/2007   on zocor and diet  Due for labs   Hx of hyperglycemia Lab Results  Component Value Date   HGBA1C 5.6 05/13/2011     Due for labs today  Not optimal diet   dexa nl 8/12  Zoster status- is interested in vaccine after she saves up the $, her insurance does not pay   Pneumovax-needs to check with her insurance   Flu vaccine- not had the shot  [facility]   Td 2004- wants to get that one done today  mammo 8/12- did not get one this summer  Goes to [facility]  Self exam -no lumps or changes   colonosc 4/05-10 year recall   Pap was in 2012  No problems  gyn  Had one erroneous abn pap smear from gyn in the past  No symptoms or problems    Patient Active Problem List  Diagnosis  . HYPERLIPIDEMIA  . ANXIETY  . HYPERTENSION  . ALLERGIC RHINITIS  . DISC DISEASE, LUMBAR  . SLEEP DISORDER  . LIVEDO RETICULARIS  . Routine general medical examination at a health care facility  . Other screening mammogram  . Hyperglycemia  . Macrocytosis  . Gynecological examination  . Abnormal EKG   Past Medical History  Diagnosis Date  . Allergic rhinitis   . Anxiety   . Hyperlipidemia 2003  . Hypertension   . Menopause   . Burning sensation     chronic,  skin  . History of ultrasound, pelvic 03/29/06    WNL  . Chronic cervicitis     with squamous metaplasia   Past Surgical History  Procedure Date  . Dilation and curettage of uterus 1987    for DQB  . Colonoscopy 3/02    polyps  . Other surgical history     Neg renal ultrasound  History  Substance Use Topics  . Smoking status: Never Smoker   . Smokeless tobacco: Not on file  . Alcohol Use: No   Family History  Problem Relation Age of Onset  . Diabetes Father   . Hypertension Father   . Heart failure Father   . Coronary artery disease Father   . Hypertension Mother    Allergies  Allergen Reactions  . Alendronate Sodium     REACTION: body pain- severe all over   Current Outpatient Prescriptions on File Prior to Visit  Medication Sig Dispense Refill  . benazepril (LOTENSIN) 40 MG tablet Take 1 tablet (40 mg total) by mouth daily.  30 tablet  11  . Calcium Carbonate-Vitamin D 600-400 MG-UNIT per tablet Take 2 tablets by mouth daily.        . carvedilol (COREG) 25 MG tablet Take 1 tablet (25 mg total) by mouth 2 (two) times daily.  60 tablet  3  . clonazePAM (KLONOPIN) 0.5 MG tablet Take 1 tablet by mouth Three times a day.      . hydrochlorothiazide (HYDRODIURIL) 25 MG tablet Take 1 tablet (25 mg total) by mouth daily.  30 tablet  6  . potassium chloride (KLOR-CON 10) 10 MEQ  tablet Take 1 tablet (10 mEq total) by mouth daily.  30 tablet  11  . simvastatin (ZOCOR) 20 MG tablet Take 1 tablet (20 mg total) by mouth at bedtime.  30 tablet  11  . traMADol (ULTRAM) 50 MG tablet TAKE ONE TABLET BY MOUTH EVERY 8 HOURS AS NEEDED  30 tablet  1  . traZODone (DESYREL) 100 MG tablet Take 1 tablet by mouth At bedtime.          Patient Active Problem List  Diagnosis  . HYPERLIPIDEMIA  . ANXIETY  . HYPERTENSION  . ALLERGIC RHINITIS  . DISC DISEASE, LUMBAR  . SLEEP DISORDER  . LIVEDO RETICULARIS  . Routine general medical examination at a health care facility  . Other screening mammogram  . Hyperglycemia  . Macrocytosis  . Gynecological examination  . Abnormal EKG   Past Medical History  Diagnosis Date  . Allergic rhinitis   . Anxiety   . Hyperlipidemia 2003  . Hypertension   . Menopause   . Burning sensation     chronic,  skin  . History of ultrasound, pelvic 03/29/06    WNL  . Chronic cervicitis     with squamous metaplasia   Past Surgical History  Procedure Date  . Dilation and curettage of uterus 1987    for DQB  . Colonoscopy 3/02    polyps  . Other surgical history     Neg renal ultrasound   History  Substance Use Topics  . Smoking status: Never Smoker   . Smokeless tobacco: Not on file  . Alcohol Use: No   Family History  Problem Relation Age of Onset  . Diabetes Father   . Hypertension Father   . Heart failure Father   . Coronary artery disease Father   . Hypertension Mother    Allergies  Allergen Reactions  . Alendronate Sodium     REACTION: body pain- severe all over   Current Outpatient Prescriptions on File Prior to Visit  Medication Sig Dispense Refill  . benazepril (LOTENSIN) 40 MG tablet Take 1 tablet (40 mg total) by mouth daily.  30 tablet  11  . Calcium Carbonate-Vitamin D 600-400 MG-UNIT per tablet Take 2 tablets by mouth daily.        Marland Kitchen  carvedilol (COREG) 25 MG tablet Take 1 tablet (25 mg total) by mouth 2 (two)  times daily.  60 tablet  3  . clonazePAM (KLONOPIN) 0.5 MG tablet Take 1 tablet by mouth Three times a day.      . hydrochlorothiazide (HYDRODIURIL) 25 MG tablet Take 1 tablet (25 mg total) by mouth daily.  30 tablet  6  . potassium chloride (KLOR-CON 10) 10 MEQ tablet Take 1 tablet (10 mEq total) by mouth daily.  30 tablet  11  . simvastatin (ZOCOR) 20 MG tablet Take 1 tablet (20 mg total) by mouth at bedtime.  30 tablet  11  . traMADol (ULTRAM) 50 MG tablet TAKE ONE TABLET BY MOUTH EVERY 8 HOURS AS NEEDED  30 tablet  1  . traZODone (DESYREL) 100 MG tablet Take 1 tablet by mouth At bedtime.          Review of Systems Review of Systems  Constitutional: Negative for fever, appetite change, fatigue and unexpected weight change.  Eyes: Negative for pain and visual disturbance.  Respiratory: Negative for cough and shortness of breath.   Cardiovascular: Negative for cp or palpitations    Gastrointestinal: Negative for nausea, diarrhea and constipation.  Genitourinary: Negative for urgency and frequency.  Skin: Negative for pallor or rash   Neurological: Negative for weakness, light-headedness, numbness and headaches.  Hematological: Negative for adenopathy. Does not bruise/bleed easily.  Psychiatric/Behavioral: Negative for dysphoric mood. The patient is not nervous/anxious.         Objective:   Physical Exam  Constitutional: She appears well-developed and well-nourished. No distress.  HENT:  Head: Normocephalic and atraumatic.  Right Ear: External ear normal.  Left Ear: External ear normal.  Nose: Nose normal.  Mouth/Throat: Oropharynx is clear and moist.  Eyes: Conjunctivae normal and EOM are normal. Pupils are equal, round, and reactive to light. Right eye exhibits no discharge. Left eye exhibits no discharge. No scleral icterus.  Neck: Normal range of motion. Neck supple. No JVD present. Carotid bruit is not present. No thyromegaly present.  Cardiovascular: Normal rate, regular  rhythm, normal heart sounds and intact distal pulses.  Exam reveals no gallop.   Pulmonary/Chest: Effort normal and breath sounds normal. No respiratory distress. She has no wheezes. She exhibits no tenderness.  Abdominal: Soft. Bowel sounds are normal. She exhibits no distension, no abdominal bruit and no mass. There is no tenderness.  Genitourinary: No breast swelling, tenderness, discharge or bleeding.       Breast exam: No mass, nodules, thickening, tenderness, bulging, retraction, inflamation, nipple discharge or skin changes noted.  No axillary or clavicular LA.  Chaperoned exam.    Musculoskeletal: She exhibits no edema and no tenderness.  Lymphadenopathy:    She has no cervical adenopathy.  Neurological: She is alert. She has normal reflexes. No cranial nerve deficit. She exhibits normal muscle tone. Coordination normal.  Skin: Skin is warm and dry. No rash noted. No erythema. No pallor.  Psychiatric: She has a normal mood and affect.          Assessment & Plan:

## 2012-10-09 NOTE — Patient Instructions (Signed)
Tetanus shot today  Don't forget to get your pneumonia vaccine and flu vaccine at a pharmacy ASAP! Because the season is here Make sure to make your mammogram appt at [facility] Labs today Take care of yourself

## 2012-10-25 ENCOUNTER — Other Ambulatory Visit (INDEPENDENT_AMBULATORY_CARE_PROVIDER_SITE_OTHER): Payer: Medicare Other

## 2012-10-25 ENCOUNTER — Ambulatory Visit (INDEPENDENT_AMBULATORY_CARE_PROVIDER_SITE_OTHER): Payer: Medicare Other | Admitting: *Deleted

## 2012-10-25 DIAGNOSIS — Z23 Encounter for immunization: Secondary | ICD-10-CM | POA: Diagnosis not present

## 2012-10-25 DIAGNOSIS — E876 Hypokalemia: Secondary | ICD-10-CM

## 2013-01-27 DIAGNOSIS — J029 Acute pharyngitis, unspecified: Secondary | ICD-10-CM | POA: Diagnosis not present

## 2013-01-27 DIAGNOSIS — R05 Cough: Secondary | ICD-10-CM | POA: Diagnosis not present

## 2013-01-27 DIAGNOSIS — H65199 Other acute nonsuppurative otitis media, unspecified ear: Secondary | ICD-10-CM | POA: Diagnosis not present

## 2013-01-29 DIAGNOSIS — F4001 Agoraphobia with panic disorder: Secondary | ICD-10-CM | POA: Diagnosis not present

## 2013-06-05 ENCOUNTER — Other Ambulatory Visit: Payer: Self-pay

## 2013-06-18 ENCOUNTER — Ambulatory Visit: Payer: Self-pay | Admitting: Family Medicine

## 2013-06-18 DIAGNOSIS — Z1231 Encounter for screening mammogram for malignant neoplasm of breast: Secondary | ICD-10-CM | POA: Diagnosis not present

## 2013-06-19 ENCOUNTER — Encounter: Payer: Self-pay | Admitting: Family Medicine

## 2013-08-05 DIAGNOSIS — F4001 Agoraphobia with panic disorder: Secondary | ICD-10-CM | POA: Diagnosis not present

## 2013-08-10 DIAGNOSIS — Z23 Encounter for immunization: Secondary | ICD-10-CM | POA: Diagnosis not present

## 2013-09-05 ENCOUNTER — Other Ambulatory Visit: Payer: Self-pay

## 2013-10-31 DIAGNOSIS — C50919 Malignant neoplasm of unspecified site of unspecified female breast: Secondary | ICD-10-CM

## 2013-10-31 HISTORY — DX: Malignant neoplasm of unspecified site of unspecified female breast: C50.919

## 2013-10-31 HISTORY — PX: BREAST BIOPSY: SHX20

## 2013-12-17 ENCOUNTER — Encounter: Payer: Medicare Other | Admitting: Family Medicine

## 2013-12-27 ENCOUNTER — Encounter: Payer: Self-pay | Admitting: Family Medicine

## 2013-12-27 ENCOUNTER — Encounter: Payer: Medicare Other | Admitting: Family Medicine

## 2013-12-27 ENCOUNTER — Ambulatory Visit (INDEPENDENT_AMBULATORY_CARE_PROVIDER_SITE_OTHER): Payer: Medicare Other | Admitting: Family Medicine

## 2013-12-27 VITALS — BP 112/72 | HR 62 | Temp 98.3°F | Ht 62.5 in | Wt 140.5 lb

## 2013-12-27 DIAGNOSIS — E785 Hyperlipidemia, unspecified: Secondary | ICD-10-CM

## 2013-12-27 DIAGNOSIS — Z0000 Encounter for general adult medical examination without abnormal findings: Secondary | ICD-10-CM

## 2013-12-27 DIAGNOSIS — Z23 Encounter for immunization: Secondary | ICD-10-CM | POA: Diagnosis not present

## 2013-12-27 DIAGNOSIS — R7309 Other abnormal glucose: Secondary | ICD-10-CM

## 2013-12-27 DIAGNOSIS — I10 Essential (primary) hypertension: Secondary | ICD-10-CM | POA: Diagnosis not present

## 2013-12-27 DIAGNOSIS — Z1211 Encounter for screening for malignant neoplasm of colon: Secondary | ICD-10-CM | POA: Diagnosis not present

## 2013-12-27 DIAGNOSIS — R739 Hyperglycemia, unspecified: Secondary | ICD-10-CM

## 2013-12-27 LAB — CBC WITH DIFFERENTIAL/PLATELET
Basophils Absolute: 0 10*3/uL (ref 0.0–0.1)
Basophils Relative: 0.8 % (ref 0.0–3.0)
EOS ABS: 0.2 10*3/uL (ref 0.0–0.7)
Eosinophils Relative: 4.6 % (ref 0.0–5.0)
HCT: 42.8 % (ref 36.0–46.0)
Hemoglobin: 14 g/dL (ref 12.0–15.0)
LYMPHS PCT: 30.4 % (ref 12.0–46.0)
Lymphs Abs: 1.5 10*3/uL (ref 0.7–4.0)
MCHC: 32.7 g/dL (ref 30.0–36.0)
MCV: 98.7 fl (ref 78.0–100.0)
Monocytes Absolute: 0.3 10*3/uL (ref 0.1–1.0)
Monocytes Relative: 5.5 % (ref 3.0–12.0)
NEUTROS PCT: 58.7 % (ref 43.0–77.0)
Neutro Abs: 3 10*3/uL (ref 1.4–7.7)
Platelets: 293 10*3/uL (ref 150.0–400.0)
RBC: 4.34 Mil/uL (ref 3.87–5.11)
RDW: 13.4 % (ref 11.5–14.6)
WBC: 5.1 10*3/uL (ref 4.5–10.5)

## 2013-12-27 LAB — COMPREHENSIVE METABOLIC PANEL
ALBUMIN: 4.4 g/dL (ref 3.5–5.2)
ALK PHOS: 50 U/L (ref 39–117)
ALT: 23 U/L (ref 0–35)
AST: 22 U/L (ref 0–37)
BUN: 17 mg/dL (ref 6–23)
CALCIUM: 10.5 mg/dL (ref 8.4–10.5)
CHLORIDE: 99 meq/L (ref 96–112)
CO2: 31 mEq/L (ref 19–32)
CREATININE: 1 mg/dL (ref 0.4–1.2)
GFR: 58.05 mL/min — ABNORMAL LOW (ref >60.00)
Glucose, Bld: 96 mg/dL (ref 70–99)
POTASSIUM: 3.6 meq/L (ref 3.5–5.1)
Sodium: 138 mEq/L (ref 135–145)
Total Bilirubin: 0.9 mg/dL (ref 0.3–1.2)
Total Protein: 7.9 g/dL (ref 6.0–8.3)

## 2013-12-27 LAB — LIPID PANEL
CHOLESTEROL: 165 mg/dL (ref 0–200)
HDL: 55.3 mg/dL (ref >39.00)
LDL CALC: 71 mg/dL (ref 0–99)
TRIGLYCERIDES: 196 mg/dL — AB (ref 0.0–149.0)
Total CHOL/HDL Ratio: 3
VLDL: 39.2 mg/dL (ref 0.0–40.0)

## 2013-12-27 LAB — TSH: TSH: 0.34 u[IU]/mL — ABNORMAL LOW (ref 0.35–5.50)

## 2013-12-27 LAB — HEMOGLOBIN A1C: Hgb A1c MFr Bld: 5.7 % (ref 4.6–6.5)

## 2013-12-27 MED ORDER — CARVEDILOL 25 MG PO TABS: 25.0000 mg | ORAL_TABLET | Freq: Two times a day (BID) | ORAL | Status: DC

## 2013-12-27 MED ORDER — BENAZEPRIL HCL 40 MG PO TABS: 40.0000 mg | ORAL_TABLET | Freq: Every day | ORAL | Status: DC

## 2013-12-27 MED ORDER — SIMVASTATIN 20 MG PO TABS
20.0000 mg | ORAL_TABLET | Freq: Every day | ORAL | Status: DC
Start: 2013-12-27 — End: 2014-12-30

## 2013-12-27 MED ORDER — POTASSIUM CHLORIDE CRYS ER 10 MEQ PO TBCR: 10.0000 meq | EXTENDED_RELEASE_TABLET | Freq: Every day | ORAL | Status: DC

## 2013-12-27 MED ORDER — HYDROCHLOROTHIAZIDE 25 MG PO TABS: 25.0000 mg | ORAL_TABLET | Freq: Every day | ORAL | Status: DC

## 2013-12-27 NOTE — Progress Notes (Signed)
Pre visit review using our clinic review tool, if applicable. No additional management support is needed unless otherwise documented below in the visit note. 

## 2013-12-27 NOTE — Patient Instructions (Signed)
Labs today Try to take care of yourself  Pneumonia vaccine today Please do the stool card for colon cancer screening

## 2013-12-27 NOTE — Progress Notes (Signed)
Subjective:    Patient ID: Shannon Stephens, female    DOB: 05/06/1946, 68 y.o.   MRN: [id number]  HPI I have personally reviewed the Medicare Annual Wellness questionnaire and have noted 1. The patient's medical and social history 2. Their use of alcohol, tobacco or illicit drugs 3. Their current medications and supplements 4. The patient's functional ability including ADL's, fall risks, home safety risks and hearing or visual             impairment. 5. Diet and physical activities 6. Evidence for depression or mood disorders  The patients weight, height, BMI have been recorded in the chart and visual acuity is per eye clinic.  I have made referrals, counseling and provided education to the patient based review of the above and I have provided the pt with a written personalized care plan for preventive services.  Is feeling ok   See scanned forms.  Routine anticipatory guidance given to patient.  See health maintenance. Flu- she had it in August  Shingles 11/28/12  PNA-needs to get that today Tetanus Td 12/13  Colonoscopy 4/05 - she wants to put that off since caring for husb with ca  Breast cancer screening 8/14 nl No lumps on self exam  Pap 7/12 - wants to hold off on that for now  Advance directive-she has a living will  Cognitive function addressed- see scanned forms- and if abnormal then additional documentation follows. No problems at all with memory    PMH and [place] reviewed  Meds, vitals, and allergies reviewed.   ROS: See HPI.  Otherwise negative.    dexa was 8/12 - with nl readings for hip and LS (lower for forearm) Does not want another one yet- perhaps next year  Taking her calcium and vit D  Hyperglycemia Lab Results  Component Value Date   HGBA1C 5.6 05/13/2011    Hyperlipidemia  Due for check Lab Results  Component Value Date   CHOL 193 10/09/2012   HDL 51.00 10/09/2012   LDLCALC 111* 10/09/2012   LDLDIRECT 157.1 05/09/2011   TRIG 153.0* 10/09/2012   CHOLHDL 4 10/09/2012   simvastatin and diet   bp is stable today  No cp or palpitations or headaches or edema  No side effects to medicines  BP Readings from Last 3 Encounters:  12/27/13 112/72  10/09/12 106/62  03/08/12 150/100     Given care of her husband- tries to take care of herself    Patient Active Problem List   Diagnosis Date Noted  . Encounter for Medicare annual wellness exam 12/27/2013  . Abnormal EKG 01/17/2012  . Other screening mammogram 05/13/2011  . Hyperglycemia 05/13/2011  . Macrocytosis 05/13/2011  . Gynecological examination 05/13/2011  . Routine general medical examination at a health care facility 05/08/2011  . [place] DISEASE, LUMBAR 02/28/2008  . LIVEDO RETICULARIS 02/28/2008  . HYPERLIPIDEMIA 01/12/2007  . ANXIETY 01/12/2007  . HYPERTENSION 01/12/2007  . ALLERGIC RHINITIS 01/12/2007  . SLEEP DISORDER 01/12/2007   Past Medical History  Diagnosis Date  . Allergic rhinitis   . Anxiety   . Hyperlipidemia 2003  . Hypertension   . Menopause   . Burning sensation     chronic,  skin  . History of ultrasound, pelvic 03/29/06    WNL  . Chronic cervicitis     with squamous metaplasia   Past Surgical History  Procedure Laterality Date  . Dilation and curettage of uterus  1987    for DQB  . Colonoscopy  3/02    polyps  . Other surgical history      Neg renal ultrasound   History  Substance Use Topics  . Smoking status: Never Smoker   . Smokeless tobacco: Not on file  . Alcohol Use: No   Family History  Problem Relation Age of Onset  . Diabetes Father   . Hypertension Father   . Heart failure Father   . Coronary artery disease Father   . Hypertension Mother    Allergies  Allergen Reactions  . Alendronate Sodium     REACTION: body pain- severe all over   Current Outpatient Prescriptions on File Prior to Visit  Medication Sig Dispense Refill  . benazepril (LOTENSIN) 40 MG tablet Take 1 tablet (40 mg total) by mouth daily.  90 tablet  3   . Calcium Carbonate-Vitamin D 600-400 MG-UNIT per tablet Take 2 tablets by mouth daily.        . carvedilol (COREG) 25 MG tablet Take 1 tablet (25 mg total) by mouth 2 (two) times daily.  180 tablet  3  . clonazePAM (KLONOPIN) 0.5 MG tablet Take 1 tablet by mouth Three times a day.      . hydrochlorothiazide (HYDRODIURIL) 25 MG tablet Take 1 tablet (25 mg total) by mouth daily.  90 tablet  3  . simvastatin (ZOCOR) 20 MG tablet Take 1 tablet (20 mg total) by mouth at bedtime.  90 tablet  3  . Thiamine HCl (B-1) 100 MG TABS Take 1 tablet by mouth daily.      . traZODone (DESYREL) 100 MG tablet Take 1 tablet by mouth At bedtime.       No current facility-administered medications on file prior to visit.    Review of Systems Review of Systems  Constitutional: Negative for fever, appetite change,  and unexpected weight change.  Eyes: Negative for pain and visual disturbance.  Respiratory: Negative for cough and shortness of breath.   Cardiovascular: Negative for cp or palpitations    Gastrointestinal: Negative for nausea, diarrhea and constipation.  Genitourinary: Negative for urgency and frequency.  Skin: Negative for pallor or rash   Neurological: Negative for weakness, light-headedness, numbness and headaches.  Hematological: Negative for adenopathy. Does not bruise/bleed easily.  Psychiatric/Behavioral: Negative for dysphoric mood. The patient is not nervous/anxious.  pos for stressors and caregiver fatigue        Objective:   Physical Exam  Constitutional: She appears well-developed and well-nourished. No distress.  HENT:  Head: Normocephalic and atraumatic.  Right Ear: External ear normal.  Left Ear: External ear normal.  Mouth/Throat: Oropharynx is clear and moist.  Eyes: Conjunctivae and EOM are normal. Pupils are equal, round, and reactive to light. No scleral icterus.  Neck: Normal range of motion. Neck supple. No JVD present. Carotid bruit is not present. No thyromegaly  present.  Cardiovascular: Normal rate, regular rhythm, normal heart sounds and intact distal pulses.  Exam reveals no gallop.   Pulmonary/Chest: Effort normal and breath sounds normal. No respiratory distress. She has no wheezes. She exhibits no tenderness.  Abdominal: Soft. Bowel sounds are normal. She exhibits no distension, no abdominal bruit and no mass. There is no tenderness.  Genitourinary: No breast swelling, tenderness, discharge or bleeding.  Breast exam: No mass, nodules, thickening, tenderness, bulging, retraction, inflamation, nipple discharge or skin changes noted.  No axillary or clavicular LA.   Musculoskeletal: Normal range of motion. She exhibits no edema and no tenderness.  Lymphadenopathy:    She has no cervical  adenopathy.  Neurological: She is alert. She has normal reflexes. No cranial nerve deficit. She exhibits normal muscle tone. Coordination normal.  Skin: Skin is warm and dry. No rash noted. No erythema. No pallor.  Solar lentigos diffusely   Psychiatric: She has a normal mood and affect.          Assessment & Plan:

## 2013-12-29 NOTE — Assessment & Plan Note (Signed)
A1C today This had been well controlled with diet in the past Rev low glycemic diet

## 2013-12-29 NOTE — Assessment & Plan Note (Signed)
On zocor and diet  Lipid prof today Rev goals for lipids and low sat fat diet

## 2013-12-29 NOTE — Assessment & Plan Note (Signed)
BP: 112/72 mmHg  bp in fair control at this time  No changes needed Disc lifstyle change with low sodium diet and exercise   Labs today

## 2013-12-29 NOTE — Assessment & Plan Note (Signed)
IFOB given - pt unable to do colonoscopy at this time in light of caregiving

## 2013-12-29 NOTE — Assessment & Plan Note (Signed)
Reviewed health habits including diet and exercise and skin cancer prevention Reviewed appropriate screening tests for age  Also reviewed health mt list, fam hx and immunization status , as well as social and family history   Labs today

## 2013-12-30 ENCOUNTER — Telehealth: Payer: Self-pay | Admitting: Family Medicine

## 2013-12-30 NOTE — Telephone Encounter (Signed)
Relevant patient education assigned to patient using Emmi. ° °

## 2014-01-15 ENCOUNTER — Other Ambulatory Visit (INDEPENDENT_AMBULATORY_CARE_PROVIDER_SITE_OTHER): Payer: Medicare Other

## 2014-01-15 DIAGNOSIS — Z1211 Encounter for screening for malignant neoplasm of colon: Secondary | ICD-10-CM

## 2014-01-15 LAB — FECAL OCCULT BLOOD, IMMUNOCHEMICAL: Fecal Occult Bld: NEGATIVE

## 2014-02-19 DIAGNOSIS — F4001 Agoraphobia with panic disorder: Secondary | ICD-10-CM | POA: Diagnosis not present

## 2014-02-24 DIAGNOSIS — R5381 Other malaise: Secondary | ICD-10-CM | POA: Diagnosis not present

## 2014-02-24 DIAGNOSIS — R5383 Other fatigue: Secondary | ICD-10-CM | POA: Diagnosis not present

## 2014-02-24 DIAGNOSIS — F411 Generalized anxiety disorder: Secondary | ICD-10-CM | POA: Diagnosis not present

## 2014-02-24 DIAGNOSIS — R946 Abnormal results of thyroid function studies: Secondary | ICD-10-CM | POA: Diagnosis not present

## 2014-03-19 DIAGNOSIS — F4001 Agoraphobia with panic disorder: Secondary | ICD-10-CM | POA: Diagnosis not present

## 2014-06-17 DIAGNOSIS — F4001 Agoraphobia with panic disorder: Secondary | ICD-10-CM | POA: Diagnosis not present

## 2014-08-12 ENCOUNTER — Ambulatory Visit: Payer: Self-pay | Admitting: Family Medicine

## 2014-08-12 DIAGNOSIS — Z1231 Encounter for screening mammogram for malignant neoplasm of breast: Secondary | ICD-10-CM | POA: Diagnosis not present

## 2014-08-12 DIAGNOSIS — R921 Mammographic calcification found on diagnostic imaging of breast: Secondary | ICD-10-CM | POA: Diagnosis not present

## 2014-08-13 ENCOUNTER — Encounter: Payer: Self-pay | Admitting: Family Medicine

## 2014-08-15 ENCOUNTER — Ambulatory Visit: Payer: Self-pay | Admitting: Family Medicine

## 2014-08-15 DIAGNOSIS — N63 Unspecified lump in breast: Secondary | ICD-10-CM | POA: Diagnosis not present

## 2014-08-15 DIAGNOSIS — R928 Other abnormal and inconclusive findings on diagnostic imaging of breast: Secondary | ICD-10-CM | POA: Diagnosis not present

## 2014-08-18 ENCOUNTER — Telehealth: Payer: Self-pay | Admitting: Family Medicine

## 2014-08-18 ENCOUNTER — Encounter: Payer: Self-pay | Admitting: Family Medicine

## 2014-08-18 DIAGNOSIS — N6310 Unspecified lump in the right breast, unspecified quadrant: Secondary | ICD-10-CM

## 2014-08-18 NOTE — Telephone Encounter (Signed)
Error

## 2014-08-19 ENCOUNTER — Encounter: Payer: Self-pay | Admitting: Family Medicine

## 2014-08-19 ENCOUNTER — Ambulatory Visit: Payer: Self-pay | Admitting: Family Medicine

## 2014-08-19 DIAGNOSIS — C50911 Malignant neoplasm of unspecified site of right female breast: Secondary | ICD-10-CM | POA: Diagnosis not present

## 2014-08-19 DIAGNOSIS — C50311 Malignant neoplasm of lower-inner quadrant of right female breast: Secondary | ICD-10-CM | POA: Diagnosis not present

## 2014-08-19 DIAGNOSIS — N63 Unspecified lump in breast: Secondary | ICD-10-CM | POA: Diagnosis not present

## 2014-08-21 ENCOUNTER — Telehealth: Payer: Self-pay | Admitting: Family Medicine

## 2014-08-21 DIAGNOSIS — C50911 Malignant neoplasm of unspecified site of right female breast: Secondary | ICD-10-CM | POA: Insufficient documentation

## 2014-08-21 NOTE — Telephone Encounter (Signed)
Estill Bamberg with [facility] called and advise me that pt has been notified of results and she is aware that Rosaria Ferries will call her

## 2014-08-21 NOTE — Telephone Encounter (Signed)
Left voicemail with [facility] to call us back and advise [place] when Radiology lets pt know of her pathology results

## 2014-08-21 NOTE — Telephone Encounter (Signed)
I usually refer to surgeon first - I will go forward with that - since they are calling her with results I want to make sure that we do not call her before they do ... So please ask radiology to call you after they inform her - then let Rosaria Ferries or Vaughan Basta know they can call her, thanks  Ref done

## 2014-08-21 NOTE — Telephone Encounter (Signed)
Message copied by Abner Greenspan on Thu Aug 21, 2014  9:48 AM ------      Message from: Tammi Sou      Created: Thu Aug 21, 2014  8:53 AM      Regarding: f/u appt. for pt       I just received a call from [facility] Radiologist and she wanted to make sure that we were putting in the referral to get pt scheduled with oncologist or whoever else you think she needs to see because of her pathology results. The Radiologist is going to call her in a little bit to give her the final results and she was going to tell her that our office is going to f/u with her about scheduling her oncology appts. ------

## 2014-08-22 ENCOUNTER — Encounter: Payer: Self-pay | Admitting: Family Medicine

## 2014-08-25 ENCOUNTER — Encounter: Payer: Self-pay | Admitting: General Surgery

## 2014-08-25 ENCOUNTER — Ambulatory Visit (INDEPENDENT_AMBULATORY_CARE_PROVIDER_SITE_OTHER): Payer: Medicare Other | Admitting: General Surgery

## 2014-08-25 ENCOUNTER — Other Ambulatory Visit: Payer: Medicare Other

## 2014-08-25 VITALS — BP 116/80 | HR 76 | Resp 12 | Ht 63.0 in | Wt 135.0 lb

## 2014-08-25 DIAGNOSIS — C50911 Malignant neoplasm of unspecified site of right female breast: Secondary | ICD-10-CM

## 2014-08-25 DIAGNOSIS — C50311 Malignant neoplasm of lower-inner quadrant of right female breast: Secondary | ICD-10-CM | POA: Insufficient documentation

## 2014-08-25 DIAGNOSIS — C50919 Malignant neoplasm of unspecified site of unspecified female breast: Secondary | ICD-10-CM | POA: Diagnosis not present

## 2014-08-25 DIAGNOSIS — Z170 Estrogen receptor positive status [ER+]: Secondary | ICD-10-CM

## 2014-08-25 NOTE — Progress Notes (Signed)
Patient ID: Shannon Stephens, female   DOB: 29-Mar-1946, 69 y.o.   MRN: [id number]  Chief Complaint  Patient presents with  . Follow-up    mammogram    HPI Shannon Stephens is a 68 y.o. female who presents for a breast evaluation. The most recent mammogram and right breast biopsy was done on 08/19/14. Patient does perform regular self breast checks and gets regular mammograms done. The patient denies any problems with the breasts at this time. No known family history of breast problems. No personal problems with the breasts.  The patient is accompanied today by her sister, Arthor Captain as well as her niece, Nemiah Commander. The niece is a pediatric ICU Midwife at [employer].  The patient's husband died in 02-27-14 after a long illness with metastatic prostate cancer. He was cared for by Drs. Choksi & Chrystal.  HPI  Past Medical History  Diagnosis Date  . Allergic rhinitis   . Anxiety   . Hyperlipidemia 2003  . Hypertension   . Menopause   . Burning sensation     chronic,  skin  . History of ultrasound, pelvic 03/29/06    WNL  . Chronic cervicitis     with squamous metaplasia    Past Surgical History  Procedure Laterality Date  . Dilation and curettage of uterus  1987    for DQB  . Colonoscopy  3/02    polyps  . Other surgical history      Neg renal ultrasound    Family History  Problem Relation Age of Onset  . Diabetes Father   . Hypertension Father   . Heart failure Father   . Coronary artery disease Father   . Hypertension Mother     Social History History  Substance Use Topics  . Smoking status: Never Smoker   . Smokeless tobacco: Not on file  . Alcohol Use: No    Allergies  Allergen Reactions  . Alendronate Sodium     REACTION: body pain- severe all over    Current Outpatient Prescriptions  Medication Sig Dispense Refill  . benazepril (LOTENSIN) 40 MG tablet Take 1 tablet (40 mg total) by mouth daily.  90 tablet  3  . Calcium  Carbonate-Vitamin D 600-400 MG-UNIT per tablet Take 2 tablets by mouth daily.        . carvedilol (COREG) 25 MG tablet Take 1 tablet (25 mg total) by mouth 2 (two) times daily.  180 tablet  3  . clonazePAM (KLONOPIN) 0.5 MG tablet Take 1 tablet by mouth Three times a day.      . hydrochlorothiazide (HYDRODIURIL) 25 MG tablet Take 1 tablet (25 mg total) by mouth daily.  90 tablet  3  . mirtazapine (REMERON) 15 MG tablet       . potassium chloride (K-DUR,KLOR-CON) 10 MEQ tablet Take 1 tablet (10 mEq total) by mouth daily.  90 tablet  3  . simvastatin (ZOCOR) 20 MG tablet Take 1 tablet (20 mg total) by mouth at bedtime.  90 tablet  3  . Thiamine HCl (B-1) 100 MG TABS Take 1 tablet by mouth daily.      . traZODone (DESYREL) 100 MG tablet Take 1 tablet by mouth At bedtime.       No current facility-administered medications for this visit.    Review of Systems Review of Systems  Constitutional: Negative.   Respiratory: Negative.   Cardiovascular: Negative.     Blood pressure 116/80, pulse 76, resp. rate 12,  height _0  (1.6 m), weight 135 lb (61.236 kg).  Physical Exam Physical Exam  Constitutional: She is oriented to person, place, and time. She appears well-developed and well-nourished.  Eyes: Conjunctivae are normal. No scleral icterus.  Neck: Neck supple.  Cardiovascular: Normal rate, regular rhythm and normal heart sounds.   Pulmonary/Chest: Effort normal and breath sounds normal. Right breast exhibits no inverted nipple, no mass, no nipple discharge, no skin change and no tenderness. Left breast exhibits no inverted nipple, no mass, no nipple discharge, no skin change and no tenderness.    Lymphadenopathy:    She has no cervical adenopathy.    She has no axillary adenopathy.  Neurological: She is alert and oriented to person, place, and time.  Skin: Skin is warm and dry.    Data Reviewed Screening mammogram dated 08/12/2014 suggested a developing mass well posterior medial in  the right breast. The patient was called back for additional views on 08/15/2014 which did show a persistent irregular mass. Subsequent ultrasound showed a hypoechoic mass measuring up to 1.3 cm in diameter at the 4:00 position. Benign calcifications in the upper outer quadrant.  Core biopsy dated 08/19/2014 showed evidence of invasive mammary carcinoma ofa tight. ER positive, PR positive, HER-2/neu pending.  Ultrasound examination of the right breast at the 4:00 position just above the inframammary fold and firms a hypoechoic mass measuring 0.6 x 0.6 x 0.8 cm. There is adjacent distortion likely secondary to the biopsy enlarging the maximum diameter to 1.2 cm. This is located approximately 1 cm above the underlying pectoralis fascia and less than 1 cm below the skin.  Assessment    Stage I carcinoma of the right breast.    Plan    We spent approximately an hour reviewing the pathology and options for management for an early stage breast cancer. Breast conservation and mastectomy were presented as equivalent in regards to long-term survival. The estimated 2% local recurrence with mastectomy as opposed to the 6% recurrence with breast conservation was discussed, as at this time the patient is very risk adverse considering her husband's long illness. I tried emphasized to her that the small minority of women who do have recurrent disease in the breast to just as well as those who have immediate mastectomy.  The patient is obviously still recovering from her husband of 53 years recent demise. She reports just prior to her diagnosis she was beginning to feel well, starting to go out again and getting back some of her energy. This diagnosis has nocturia on her pins.  The importance of taking adequate time to make a good decision was reviewed. The option for second opinion was discussed. The role of medical oncology after all the data has been gathered was reviewed area was discussed.  An informational  brochure was provided. Website information given.  She'll call the office if she has any questions or if she desires assistance was second opinion. Patient to have the following labs drawn at [facility] today: CEA and CA 27-29.    PCP/Ref MD: Laymond Purser, Forest Gleason 08/25/2014, 9:11 PM

## 2014-08-25 NOTE — Patient Instructions (Signed)
Patient to have labs drawn today.

## 2014-08-26 ENCOUNTER — Telehealth: Payer: Self-pay | Admitting: *Deleted

## 2014-08-26 LAB — CANCER ANTIGEN 27.29: CA 27.29: 30.8 U/mL (ref 0.0–38.6)

## 2014-08-26 LAB — CEA: CEA: 0.7 ng/mL (ref 0.0–4.7)

## 2014-08-26 NOTE — Telephone Encounter (Signed)
Notified patient as instructed, patient pleased. She discussed her situation, she is terrified of chemotherapy. She is still deciding lumpectomy vs mastectomy and will let us know in a couple of days her decisions. She did say she wanted Dr Oliva Bustard at the [facility] for her MD because he was her husbands MD.

## 2014-08-26 NOTE — Telephone Encounter (Signed)
Message copied by Carson Myrtle on Tue Aug 26, 2014  2:12 PM ------      Message from: Robert Bellow      Created: Tue Aug 26, 2014 12:41 PM       Please notify yesterday's lab tests were fine.  See if she has any ???? (new DX breast cancer). Thanks.      ----- Message -----         From: [facility] Lab Results In Interface         Sent: 08/26/2014   5:40 AM           To: Robert Bellow, MD                   ------

## 2014-08-27 ENCOUNTER — Telehealth: Payer: Self-pay

## 2014-08-27 NOTE — Telephone Encounter (Signed)
Many questions answered. Tried to clarify for her that what is done to the breast does not determine whether she needs chemotherapy. In the course of four minutes she had decided at first to have a mastectomy, and then later to have a lumpectomy.  She will consider her options and notify the office of her decision.

## 2014-08-27 NOTE — Telephone Encounter (Signed)
The patient called and wanted to speak with you further regarding surgical treatment options and also about her recent labs that were done. She just had a few more questions for you about this.

## 2014-08-29 ENCOUNTER — Telehealth: Payer: Self-pay

## 2014-08-29 NOTE — Telephone Encounter (Signed)
Spoke with patient about her surgery scheduled for 09/04/14 at [facility]. She is to report to the radiology desk on 09/04/14 at 10:15 am. She will come here to the office on 09/01/14 at 10:45 am for a pre op visit with Dr Bary Castilla and then she will go to Pre Admit testing at the hospital at 2:15 pm that same day. The patient is aware of dates, times, and all instructions.

## 2014-08-31 DIAGNOSIS — C50311 Malignant neoplasm of lower-inner quadrant of right female breast: Secondary | ICD-10-CM

## 2014-08-31 HISTORY — PX: MASTECTOMY: SHX3

## 2014-08-31 HISTORY — DX: Malignant neoplasm of lower-inner quadrant of right female breast: C50.311

## 2014-09-01 ENCOUNTER — Encounter: Payer: Self-pay | Admitting: General Surgery

## 2014-09-01 ENCOUNTER — Other Ambulatory Visit: Payer: Self-pay | Admitting: General Surgery

## 2014-09-01 ENCOUNTER — Ambulatory Visit (INDEPENDENT_AMBULATORY_CARE_PROVIDER_SITE_OTHER): Payer: Medicare Other | Admitting: General Surgery

## 2014-09-01 ENCOUNTER — Ambulatory Visit: Payer: Self-pay | Admitting: General Surgery

## 2014-09-01 VITALS — BP 130/68 | HR 76 | Resp 14 | Ht 63.0 in | Wt 137.0 lb

## 2014-09-01 DIAGNOSIS — I10 Essential (primary) hypertension: Secondary | ICD-10-CM | POA: Diagnosis not present

## 2014-09-01 DIAGNOSIS — Z01812 Encounter for preprocedural laboratory examination: Secondary | ICD-10-CM | POA: Diagnosis not present

## 2014-09-01 DIAGNOSIS — F419 Anxiety disorder, unspecified: Secondary | ICD-10-CM | POA: Diagnosis not present

## 2014-09-01 DIAGNOSIS — C50911 Malignant neoplasm of unspecified site of right female breast: Secondary | ICD-10-CM

## 2014-09-01 DIAGNOSIS — E785 Hyperlipidemia, unspecified: Secondary | ICD-10-CM | POA: Diagnosis not present

## 2014-09-01 DIAGNOSIS — Z01810 Encounter for preprocedural cardiovascular examination: Secondary | ICD-10-CM | POA: Diagnosis not present

## 2014-09-01 LAB — POTASSIUM: Potassium: 3.7 mmol/L (ref 3.5–5.1)

## 2014-09-01 NOTE — Patient Instructions (Signed)
Patient scheduled for right mastectomy on 09/04/14. The patient is aware to call back for any questions or concerns.

## 2014-09-01 NOTE — Progress Notes (Signed)
Patient ID: Shannon Stephens, female   DOB: Mar 20, 1946, 68 y.o.   MRN: [id number]  Chief Complaint  Patient presents with  . Pre-op Exam    HPI Shannon Stephens is a 68 y.o. female  Here for a pre operative visit for a Right mastectomy. Her surgery is scheduled for  09/04/14. No problems at this time.   HPI  Past Medical History  Diagnosis Date  . Allergic rhinitis   . Anxiety   . Hyperlipidemia 2003  . Hypertension   . Menopause   . Burning sensation     chronic,  skin  . History of ultrasound, pelvic 03/29/06    WNL  . Chronic cervicitis     with squamous metaplasia    Past Surgical History  Procedure Laterality Date  . Dilation and curettage of uterus  1987    for DQB  . Colonoscopy  3/02    polyps  . Other surgical history      Neg renal ultrasound    Family History  Problem Relation Age of Onset  . Diabetes Father   . Hypertension Father   . Heart failure Father   . Coronary artery disease Father   . Hypertension Mother     Social History History  Substance Use Topics  . Smoking status: Never Smoker   . Smokeless tobacco: Not on file  . Alcohol Use: No    Allergies  Allergen Reactions  . Alendronate Sodium     REACTION: body pain- severe all over    Current Outpatient Prescriptions  Medication Sig Dispense Refill  . benazepril (LOTENSIN) 40 MG tablet Take 1 tablet (40 mg total) by mouth daily. 90 tablet 3  . Calcium Carbonate-Vitamin D 600-400 MG-UNIT per tablet Take 2 tablets by mouth daily.      . carvedilol (COREG) 25 MG tablet Take 1 tablet (25 mg total) by mouth 2 (two) times daily. 180 tablet 3  . clonazePAM (KLONOPIN) 1 MG tablet Take 1 mg by mouth 3 (three) times daily as needed.   4  . hydrochlorothiazide (HYDRODIURIL) 25 MG tablet Take 1 tablet (25 mg total) by mouth daily. 90 tablet 3  . mirtazapine (REMERON) 15 MG tablet Take 15 mg by mouth at bedtime.     . potassium chloride (K-DUR,KLOR-CON) 10 MEQ tablet Take 1 tablet (10 mEq total) by  mouth daily. 90 tablet 3  . simvastatin (ZOCOR) 20 MG tablet Take 1 tablet (20 mg total) by mouth at bedtime. 90 tablet 3  . traZODone (DESYREL) 100 MG tablet Take 1 tablet by mouth At bedtime.     No current facility-administered medications for this visit.    Review of Systems Review of Systems  Constitutional: Negative.   Respiratory: Negative.   Cardiovascular: Negative.     Blood pressure 130/68, pulse 76, resp. rate 14, height 5\' 3"  (1.6 m), weight 137 lb (62.143 kg).  Physical Exam Physical Exam  Constitutional: She is oriented to person, place, and time. She appears well-developed and well-nourished.  Neck: Neck supple. No thyromegaly present.  Cardiovascular: Normal rate, regular rhythm and normal heart sounds.   No murmur heard. Pulmonary/Chest: Effort normal and breath sounds normal. Right breast exhibits no inverted nipple, no mass, no nipple discharge, no skin change and no tenderness.    Lymphadenopathy:    She has no cervical adenopathy.  Neurological: She is alert and oriented to person, place, and time.  Skin: Skin is warm and dry.     Assessment  Breast cancer      Plan    The patient has decided that mastectomy would best suite her needs.  The plan for mastectomy and sentinel node biopsy was reviewed with the patient and her sister, Shannon Stephens, who accompanied her today.      PCP/Ref MD:  Loura Pardon   Hervey Ard W 09/01/2014, 11:22 AM

## 2014-09-04 ENCOUNTER — Ambulatory Visit: Payer: Self-pay | Admitting: General Surgery

## 2014-09-04 ENCOUNTER — Encounter: Payer: Self-pay | Admitting: General Surgery

## 2014-09-04 DIAGNOSIS — C50911 Malignant neoplasm of unspecified site of right female breast: Secondary | ICD-10-CM | POA: Diagnosis not present

## 2014-09-04 DIAGNOSIS — I10 Essential (primary) hypertension: Secondary | ICD-10-CM | POA: Diagnosis not present

## 2014-09-04 DIAGNOSIS — C50311 Malignant neoplasm of lower-inner quadrant of right female breast: Secondary | ICD-10-CM

## 2014-09-04 HISTORY — PX: BREAST SURGERY: SHX581

## 2014-09-08 ENCOUNTER — Telehealth: Payer: Self-pay | Admitting: General Surgery

## 2014-09-08 ENCOUNTER — Encounter: Payer: Self-pay | Admitting: General Surgery

## 2014-09-08 ENCOUNTER — Ambulatory Visit (INDEPENDENT_AMBULATORY_CARE_PROVIDER_SITE_OTHER): Payer: Self-pay | Admitting: *Deleted

## 2014-09-08 DIAGNOSIS — C50911 Malignant neoplasm of unspecified site of right female breast: Secondary | ICD-10-CM

## 2014-09-08 NOTE — Progress Notes (Signed)
Patient came in today for a dressing change, Area is clean and no signs of infection. Re wrap, Patient to return as scheduled.

## 2014-09-08 NOTE — Patient Instructions (Signed)
Patient to return as scheduled.  

## 2014-09-08 NOTE — Telephone Encounter (Signed)
Notified of path results.   Node negative, margins negative.   Drain management going well.

## 2014-09-11 ENCOUNTER — Encounter: Payer: Self-pay | Admitting: General Surgery

## 2014-09-11 ENCOUNTER — Ambulatory Visit (INDEPENDENT_AMBULATORY_CARE_PROVIDER_SITE_OTHER): Payer: Self-pay | Admitting: General Surgery

## 2014-09-11 VITALS — BP 140/82 | HR 76 | Resp 14 | Ht 63.0 in | Wt 135.0 lb

## 2014-09-11 DIAGNOSIS — C50911 Malignant neoplasm of unspecified site of right female breast: Secondary | ICD-10-CM

## 2014-09-11 MED ORDER — LETROZOLE 2.5 MG PO TABS: 2.5000 mg | ORAL_TABLET | Freq: Every day | ORAL | Status: DC

## 2014-09-11 NOTE — Patient Instructions (Addendum)
Patient to return in one week. 

## 2014-09-11 NOTE — Progress Notes (Signed)
Patient ID: Shannon Stephens, female   DOB: 10-15-1946, 68 y.o.   MRN: [id number]  Chief Complaint  Patient presents with  . Routine Post Op    post op mastectomy    HPI Shannon Stephens is a 68 y.o. female who presents for a post op right mastectomy. The procedure was performed on 09/04/14. The patient is doing well. No new complaints at this time.   HPI  Past Medical History  Diagnosis Date  . Allergic rhinitis   . Anxiety   . Hyperlipidemia 2003  . Hypertension   . Menopause   . Burning sensation     chronic,  skin  . History of ultrasound, pelvic 03/29/06    WNL  . Chronic cervicitis     with squamous metaplasia  . Breast cancer of lower-inner quadrant of right female breast November 2015    T1c, N0/ ER + ; PR+; Her 2 neu not overexpressed.    Past Surgical History  Procedure Laterality Date  . Dilation and curettage of uterus  1987    for DQB  . Colonoscopy  3/02    polyps  . Other surgical history      Neg renal ultrasound  . Breast surgery Right 09/04/14    Mastectomy     Family History  Problem Relation Age of Onset  . Diabetes Father   . Hypertension Father   . Heart failure Father   . Coronary artery disease Father   . Hypertension Mother     Social History History  Substance Use Topics  . Smoking status: Never Smoker   . Smokeless tobacco: Not on file  . Alcohol Use: No    Allergies  Allergen Reactions  . Alendronate Sodium     REACTION: body pain- severe all over    Current Outpatient Prescriptions  Medication Sig Dispense Refill  . benazepril (LOTENSIN) 40 MG tablet Take 1 tablet (40 mg total) by mouth daily. 90 tablet 3  . Calcium Carbonate-Vitamin D 600-400 MG-UNIT per tablet Take 2 tablets by mouth daily.      . carvedilol (COREG) 25 MG tablet Take 1 tablet (25 mg total) by mouth 2 (two) times daily. 180 tablet 3  . clonazePAM (KLONOPIN) 1 MG tablet Take 1 mg by mouth 3 (three) times daily as needed.   4  . hydrochlorothiazide (HYDRODIURIL)  25 MG tablet Take 1 tablet (25 mg total) by mouth daily. 90 tablet 3  . mirtazapine (REMERON) 15 MG tablet Take 15 mg by mouth at bedtime.     . potassium chloride (K-DUR,KLOR-CON) 10 MEQ tablet Take 1 tablet (10 mEq total) by mouth daily. 90 tablet 3  . simvastatin (ZOCOR) 20 MG tablet Take 1 tablet (20 mg total) by mouth at bedtime. 90 tablet 3  . traZODone (DESYREL) 100 MG tablet Take 1 tablet by mouth At bedtime.    Marland Kitchen letrozole (FEMARA) 2.5 MG tablet Take 1 tablet (2.5 mg total) by mouth daily. 30 tablet 11   No current facility-administered medications for this visit.    Review of Systems Review of Systems  Constitutional: Negative.   Respiratory: Negative.   Cardiovascular: Negative.     Blood pressure 140/82, pulse 76, resp. rate 14, height 5\' 3"  (1.6 m), weight 135 lb (61.236 kg).  Physical Exam Physical Exam No evidence of loculated fluid. Skin flaps intact. Drain exit site clean.  Data Reviewed T1c, N0/ ER + ; PR+; Her 2 neu not overexpressed. Drainage volume is running  About  45+ or minus cc per day. Assessment    Doing well status post mastectomy.     Plan    The compressive wrap will be discontinued. We'll plan for follow-up examination in one week.  The patient was offered the opportunity for formal medical oncology consultation. She'll notify the office next week if she would like to proceed.     PCP/Ref MD:  Loura Pardon   Hervey Ard W 09/12/2014, 8:34 PM

## 2014-09-12 ENCOUNTER — Encounter: Payer: Self-pay | Admitting: General Surgery

## 2014-09-12 DIAGNOSIS — C50919 Malignant neoplasm of unspecified site of unspecified female breast: Secondary | ICD-10-CM | POA: Insufficient documentation

## 2014-09-17 ENCOUNTER — Ambulatory Visit (INDEPENDENT_AMBULATORY_CARE_PROVIDER_SITE_OTHER): Payer: Self-pay | Admitting: General Surgery

## 2014-09-17 ENCOUNTER — Telehealth: Payer: Self-pay | Admitting: *Deleted

## 2014-09-17 ENCOUNTER — Encounter: Payer: Self-pay | Admitting: General Surgery

## 2014-09-17 VITALS — BP 130/78 | HR 86 | Resp 12 | Ht 63.0 in | Wt 135.0 lb

## 2014-09-17 DIAGNOSIS — C50911 Malignant neoplasm of unspecified site of right female breast: Secondary | ICD-10-CM

## 2014-09-17 NOTE — Progress Notes (Signed)
Patient ID: Shannon Stephens, female   DOB: 04-02-1946, 68 y.o.   MRN: [id number]  Chief Complaint  Patient presents with  . Other    one week post op mastectomy    HPI Shannon Stephens is a 68 y.o. female who presents for a 1 week post op right mastectomy. The procedure was performed on 09/04/14. The patient is doing well. The patient has provided her drain sheet at today's visit. Drainage volume is less than 30 mL per day for the last 3 days.  HPI  Past Medical History  Diagnosis Date  . Allergic rhinitis   . Anxiety   . Hyperlipidemia 2003  . Hypertension   . Menopause   . Burning sensation     chronic,  skin  . History of ultrasound, pelvic 03/29/06    WNL  . Chronic cervicitis     with squamous metaplasia  . Breast cancer of lower-inner quadrant of right female breast November 2015    T1c, N0/ ER + ; PR+; Her 2 neu not overexpressed.    Past Surgical History  Procedure Laterality Date  . Dilation and curettage of uterus  1987    for DQB  . Colonoscopy  3/02    polyps  . Other surgical history      Neg renal ultrasound  . Breast surgery Right 09/04/14    Mastectomy     Family History  Problem Relation Age of Onset  . Diabetes Father   . Hypertension Father   . Heart failure Father   . Coronary artery disease Father   . Hypertension Mother     Social History History  Substance Use Topics  . Smoking status: Never Smoker   . Smokeless tobacco: Not on file  . Alcohol Use: No    Allergies  Allergen Reactions  . Alendronate Sodium     REACTION: body pain- severe all over    Current Outpatient Prescriptions  Medication Sig Dispense Refill  . benazepril (LOTENSIN) 40 MG tablet Take 1 tablet (40 mg total) by mouth daily. 90 tablet 3  . Calcium Carbonate-Vitamin D 600-400 MG-UNIT per tablet Take 2 tablets by mouth daily.      . carvedilol (COREG) 25 MG tablet Take 1 tablet (25 mg total) by mouth 2 (two) times daily. 180 tablet 3  . clonazePAM (KLONOPIN) 1 MG  tablet Take 1 mg by mouth 3 (three) times daily as needed.   4  . hydrochlorothiazide (HYDRODIURIL) 25 MG tablet Take 1 tablet (25 mg total) by mouth daily. 90 tablet 3  . letrozole (FEMARA) 2.5 MG tablet Take 1 tablet (2.5 mg total) by mouth daily. 30 tablet 11  . mirtazapine (REMERON) 15 MG tablet Take 15 mg by mouth at bedtime.     . potassium chloride (K-DUR,KLOR-CON) 10 MEQ tablet Take 1 tablet (10 mEq total) by mouth daily. 90 tablet 3  . simvastatin (ZOCOR) 20 MG tablet Take 1 tablet (20 mg total) by mouth at bedtime. 90 tablet 3  . traZODone (DESYREL) 100 MG tablet Take 1 tablet by mouth At bedtime.     No current facility-administered medications for this visit.    Review of Systems Review of Systems  Constitutional: Negative.   Respiratory: Negative.   Cardiovascular: Negative.     Blood pressure 130/78, pulse 86, resp. rate 12, height 5\' 3"  (1.6 m), weight 135 lb (61.236 kg).  Physical Exam Physical Exam  Constitutional: She is oriented to person, place, and time. She appears well-developed  and well-nourished.  Pulmonary/Chest:  Right mastectomy site well healing. Drain removed at today's visit.   Musculoskeletal: Normal range of motion.  Neurological: She is alert and oriented to person, place, and time.  Skin: Skin is warm and dry.    Data Reviewed Drain record.   Assessment    Doing well status post right simple mastectomy and sentinel node biopsy.    Plan    The patient's case was presented at the [facility] breast cancer tumor board. She is felt to be a candidate for Oncotype DX testing to help determine if she would be a candidate for adjuvant chemotherapy. This testing was reviewed with the patient and the results will be available in 2-3 weeks.  She was advised that she may develop a small amount of fluid out of the drain is been removed and that should not concern her unless she is uncomfortable. We'll plan for follow-up exam in 1 week.    PCP:  Tower,  [place] 09/17/2014, 9:12 PM

## 2014-09-17 NOTE — Telephone Encounter (Signed)
-----   Message from Robert Bellow, MD sent at 09/17/2014  1:21 PM EST ----- Please arrange for pathology department at [facility] to send her tumor out for Oncotype DX testing. Thanks.

## 2014-09-17 NOTE — Telephone Encounter (Signed)
Aaron Edelman will let Rise Paganini know and fax a order form to sign.

## 2014-09-17 NOTE — Patient Instructions (Signed)
Patient to return in 1 week for follow up. The patient is aware to call back for any questions or concerns.

## 2014-09-23 ENCOUNTER — Encounter: Payer: Self-pay | Admitting: General Surgery

## 2014-09-23 ENCOUNTER — Ambulatory Visit (INDEPENDENT_AMBULATORY_CARE_PROVIDER_SITE_OTHER): Payer: Self-pay | Admitting: General Surgery

## 2014-09-23 VITALS — BP 122/82 | HR 78 | Resp 12 | Ht 63.0 in | Wt 135.0 lb

## 2014-09-23 DIAGNOSIS — C50911 Malignant neoplasm of unspecified site of right female breast: Secondary | ICD-10-CM

## 2014-09-23 NOTE — Progress Notes (Signed)
Patient ID: Shannon Stephens, female   DOB: 11/09/1945, 68 y.o.   MRN: [id number]  Chief Complaint  Patient presents with  . Routine Post Op    mastectomy    HPI Shannon Stephens is a 68 y.o. female who presents for her post op right mastectomy. The procedure was performed on 09/04/14. The patient is doing well. No new complaints.  HPI  Past Medical History  Diagnosis Date  . Allergic rhinitis   . Anxiety   . Hyperlipidemia 2003  . Hypertension   . Menopause   . Burning sensation     chronic,  skin  . History of ultrasound, pelvic 03/29/06    WNL  . Chronic cervicitis     with squamous metaplasia  . Breast cancer of lower-inner quadrant of right female breast November 2015    T1c, N0/ ER + ; PR+; Her 2 neu not overexpressed.    Past Surgical History  Procedure Laterality Date  . Dilation and curettage of uterus  1987    for DQB  . Colonoscopy  3/02    polyps  . Other surgical history      Neg renal ultrasound  . Breast surgery Right 09/04/14    Mastectomy     Family History  Problem Relation Age of Onset  . Diabetes Father   . Hypertension Father   . Heart failure Father   . Coronary artery disease Father   . Hypertension Mother     Social History History  Substance Use Topics  . Smoking status: Never Smoker   . Smokeless tobacco: Never Used  . Alcohol Use: No    Allergies  Allergen Reactions  . Alendronate Sodium     FOSAMAX REACTION: body pain- severe all over    Current Outpatient Prescriptions  Medication Sig Dispense Refill  . benazepril (LOTENSIN) 40 MG tablet Take 1 tablet (40 mg total) by mouth daily. 90 tablet 3  . Calcium Carbonate-Vitamin D 600-400 MG-UNIT per tablet Take 2 tablets by mouth daily.      . carvedilol (COREG) 25 MG tablet Take 1 tablet (25 mg total) by mouth 2 (two) times daily. 180 tablet 3  . clonazePAM (KLONOPIN) 1 MG tablet Take 1 mg by mouth 3 (three) times daily as needed.   4  . hydrochlorothiazide (HYDRODIURIL) 25 MG tablet  Take 1 tablet (25 mg total) by mouth daily. 90 tablet 3  . letrozole (FEMARA) 2.5 MG tablet Take 1 tablet (2.5 mg total) by mouth daily. 30 tablet 11  . mirtazapine (REMERON) 15 MG tablet Take 15 mg by mouth at bedtime.     . potassium chloride (K-DUR,KLOR-CON) 10 MEQ tablet Take 1 tablet (10 mEq total) by mouth daily. 90 tablet 3  . simvastatin (ZOCOR) 20 MG tablet Take 1 tablet (20 mg total) by mouth at bedtime. 90 tablet 3  . traZODone (DESYREL) 100 MG tablet Take 1 tablet by mouth At bedtime.     No current facility-administered medications for this visit.    Review of Systems Review of Systems  Constitutional: Negative.   Respiratory: Negative.   Cardiovascular: Negative.     Blood pressure 122/82, pulse 78, resp. rate 12, height 5\' 3"  (1.6 m), weight 135 lb (61.236 kg).  Physical Exam Physical Exam  Constitutional: She is oriented to person, place, and time. She appears well-developed and well-nourished.  Pulmonary/Chest:  Incision healing well. Small amount of fluid noted beneath the mastectomy flap. 15 ml fluid aspirated after ChloraPrep and 1  mL of 1% plain Xylocaine.  Excellent shoulder range of motion.  Neurological: She is alert and oriented to person, place, and time.  Skin: Skin is warm and dry.    Data Reviewed Oncotype DX pending.  Assessment    Doing well status post right mastectomy with sentinel node biopsy.    Plan    The patient will be contacted when her Oncotype testing results are available. We'll arrange for evaluation with medical oncology (Dr. Oliva Bustard) after all the laboratory results are available.    Follow up in 2 weeks.   PCP:  Tower, [place] 09/24/2014, 2:18 PM

## 2014-09-23 NOTE — Patient Instructions (Signed)
The patient is aware to call back for any questions or concerns.  

## 2014-09-24 ENCOUNTER — Encounter: Payer: Self-pay | Admitting: General Surgery

## 2014-09-24 DIAGNOSIS — C50919 Malignant neoplasm of unspecified site of unspecified female breast: Secondary | ICD-10-CM | POA: Insufficient documentation

## 2014-09-28 DIAGNOSIS — Z23 Encounter for immunization: Secondary | ICD-10-CM | POA: Diagnosis not present

## 2014-10-02 DIAGNOSIS — C50311 Malignant neoplasm of lower-inner quadrant of right female breast: Secondary | ICD-10-CM | POA: Diagnosis not present

## 2014-10-03 ENCOUNTER — Encounter: Payer: Self-pay | Admitting: General Surgery

## 2014-10-06 ENCOUNTER — Telehealth: Payer: Self-pay | Admitting: General Surgery

## 2014-10-06 NOTE — Telephone Encounter (Signed)
Patient has been scheduled for an appointment with Dr. Oliva Bustard at the [facility] for 10-14-14 at 8 am. This patient is aware of date, time, and instructions.   Records forwarded for Dr. Metro Kung review.  Patient will come in as scheduled on Wednesday for follow up with Dr. Bary Castilla.

## 2014-10-06 NOTE — Telephone Encounter (Signed)
The patient was notified that Oncotype DX results have been received. She is in the lower end of the intermediate group.  Will arrange for evaluation with Dr. Oliva Bustard for medical oncology for his recommendations regarding adjuvant therapy.  She will plan to follow up in office on December 9 as previously planned for evaluation of the mastectomy site.

## 2014-10-08 ENCOUNTER — Encounter: Payer: Self-pay | Admitting: General Surgery

## 2014-10-08 ENCOUNTER — Ambulatory Visit (INDEPENDENT_AMBULATORY_CARE_PROVIDER_SITE_OTHER): Payer: Self-pay | Admitting: General Surgery

## 2014-10-08 VITALS — BP 124/72 | HR 70 | Resp 12 | Ht 63.0 in | Wt 138.0 lb

## 2014-10-08 DIAGNOSIS — C50911 Malignant neoplasm of unspecified site of right female breast: Secondary | ICD-10-CM

## 2014-10-08 NOTE — Progress Notes (Signed)
Patient ID: Shannon Stephens, female   DOB: 1946/05/02, 68 y.o.   MRN: [id number]  Chief Complaint  Patient presents with  . Routine Post Op    HPI Shannon Stephens is a 68 y.o. female.  who presents for her post op right mastectomy. The procedure was performed on 09/04/14. The patient is doing well. No new complaints.  HPI  Past Medical History  Diagnosis Date  . Allergic rhinitis   . Anxiety   . Hyperlipidemia 2003  . Hypertension   . Menopause   . Burning sensation     chronic,  skin  . History of ultrasound, pelvic 03/29/06    WNL  . Chronic cervicitis     with squamous metaplasia  . Breast cancer of lower-inner quadrant of right female breast November 2015    T1c, N0/ ER + ; PR+; Her 2 neu not overexpressed.    Past Surgical History  Procedure Laterality Date  . Dilation and curettage of uterus  1987    for DQB  . Colonoscopy  3/02    polyps  . Other surgical history      Neg renal ultrasound  . Breast surgery Right 09/04/14    Mastectomy     Family History  Problem Relation Age of Onset  . Diabetes Father   . Hypertension Father   . Heart failure Father   . Coronary artery disease Father   . Hypertension Mother     Social History History  Substance Use Topics  . Smoking status: Never Smoker   . Smokeless tobacco: Never Used  . Alcohol Use: No    Allergies  Allergen Reactions  . Alendronate Sodium     FOSAMAX REACTION: body pain- severe all over    Current Outpatient Prescriptions  Medication Sig Dispense Refill  . benazepril (LOTENSIN) 40 MG tablet Take 1 tablet (40 mg total) by mouth daily. 90 tablet 3  . Calcium Carbonate-Vitamin D 600-400 MG-UNIT per tablet Take 2 tablets by mouth daily.      . carvedilol (COREG) 25 MG tablet Take 1 tablet (25 mg total) by mouth 2 (two) times daily. 180 tablet 3  . clonazePAM (KLONOPIN) 1 MG tablet Take 1 mg by mouth 3 (three) times daily as needed.   4  . hydrochlorothiazide (HYDRODIURIL) 25 MG tablet Take 1 tablet  (25 mg total) by mouth daily. 90 tablet 3  . letrozole (FEMARA) 2.5 MG tablet Take 1 tablet (2.5 mg total) by mouth daily. 30 tablet 11  . mirtazapine (REMERON) 15 MG tablet Take 15 mg by mouth at bedtime.     . potassium chloride (K-DUR,KLOR-CON) 10 MEQ tablet Take 1 tablet (10 mEq total) by mouth daily. 90 tablet 3  . simvastatin (ZOCOR) 20 MG tablet Take 1 tablet (20 mg total) by mouth at bedtime. 90 tablet 3  . traZODone (DESYREL) 100 MG tablet Take 1 tablet by mouth At bedtime.     No current facility-administered medications for this visit.    Review of Systems Review of Systems  Constitutional: Negative.   Cardiovascular: Negative.     Blood pressure 124/72, pulse 70, resp. rate 12, height 5\' 3"  (1.6 m), weight 138 lb (62.596 kg).  Physical Exam Physical Exam  Constitutional: She is oriented to person, place, and time. She appears well-developed and well-nourished.  Pulmonary/Chest:  Right mastectomy site well healed.  Musculoskeletal:  Excellent range of motion.  Neurological: She is alert and oriented to person, place, and time.  Skin: Skin  is warm and dry.    Data Reviewed Oncotype DX score was in the low intermediate range at 22, 14% risk of 10 year recurrence with tamoxifen alone.  Assessment    Doing well status post right mastectomy.    Plan    We'll look forward to medical oncology's evaluation and recommendations regarding adjuvant chemotherapy. If this is not recommended she'll be a candidate for an aromatase inhibitor.     Appointment with Dr. Oliva Bustard 10-14-14. Follow up in one month.  PCP: Tower, Roque Lias  [place], Dellis Filbert W 10/08/2014, 7:23 PM

## 2014-10-08 NOTE — Patient Instructions (Signed)
Continue self breast exams. Call office for any new breast issues or concerns. 

## 2014-10-14 ENCOUNTER — Ambulatory Visit: Payer: Self-pay | Admitting: Oncology

## 2014-10-14 DIAGNOSIS — Z79811 Long term (current) use of aromatase inhibitors: Secondary | ICD-10-CM | POA: Diagnosis not present

## 2014-10-14 DIAGNOSIS — E785 Hyperlipidemia, unspecified: Secondary | ICD-10-CM | POA: Diagnosis not present

## 2014-10-14 DIAGNOSIS — Z79899 Other long term (current) drug therapy: Secondary | ICD-10-CM | POA: Diagnosis not present

## 2014-10-14 DIAGNOSIS — Z170 Estrogen receptor positive status [ER+]: Secondary | ICD-10-CM | POA: Diagnosis not present

## 2014-10-14 DIAGNOSIS — Z9011 Acquired absence of right breast and nipple: Secondary | ICD-10-CM | POA: Diagnosis not present

## 2014-10-14 DIAGNOSIS — C50311 Malignant neoplasm of lower-inner quadrant of right female breast: Secondary | ICD-10-CM | POA: Diagnosis not present

## 2014-10-14 DIAGNOSIS — I10 Essential (primary) hypertension: Secondary | ICD-10-CM | POA: Diagnosis not present

## 2014-10-29 ENCOUNTER — Encounter: Payer: Self-pay | Admitting: General Surgery

## 2014-10-31 ENCOUNTER — Ambulatory Visit: Payer: Self-pay | Admitting: Oncology

## 2014-10-31 DIAGNOSIS — Z79811 Long term (current) use of aromatase inhibitors: Secondary | ICD-10-CM | POA: Diagnosis not present

## 2014-10-31 DIAGNOSIS — Z79899 Other long term (current) drug therapy: Secondary | ICD-10-CM | POA: Diagnosis not present

## 2014-10-31 DIAGNOSIS — C50311 Malignant neoplasm of lower-inner quadrant of right female breast: Secondary | ICD-10-CM | POA: Diagnosis not present

## 2014-10-31 DIAGNOSIS — Z170 Estrogen receptor positive status [ER+]: Secondary | ICD-10-CM | POA: Diagnosis not present

## 2014-11-05 ENCOUNTER — Ambulatory Visit (INDEPENDENT_AMBULATORY_CARE_PROVIDER_SITE_OTHER): Payer: Self-pay | Admitting: General Surgery

## 2014-11-05 ENCOUNTER — Encounter: Payer: Self-pay | Admitting: General Surgery

## 2014-11-05 VITALS — BP 142/74 | HR 76 | Resp 12 | Ht 63.0 in | Wt 141.0 lb

## 2014-11-05 DIAGNOSIS — C50911 Malignant neoplasm of unspecified site of right female breast: Secondary | ICD-10-CM

## 2014-11-05 NOTE — Progress Notes (Signed)
Patient ID: Shannon Stephens, female   DOB: 01-May-1946, 69 y.o.   MRN: [id number]  Chief Complaint  Patient presents with  . Follow-up    HPI Shannon Stephens is a 69 y.o. female.  Here today for breast cancer follow up. No new breast complaints. Followed by Dr Oliva Bustard and a bone density is scheduled for 11-18-14. Tolerating Femara.  HPI  Past Medical History  Diagnosis Date  . Allergic rhinitis   . Anxiety   . Hyperlipidemia 2003  . Hypertension   . Menopause   . Burning sensation     chronic,  skin  . History of ultrasound, pelvic 03/29/06    WNL  . Chronic cervicitis     with squamous metaplasia  . Breast cancer of lower-inner quadrant of right female breast November 2015    T1c, N0/ ER + ; PR+; Her 2 neu not overexpressed.    Past Surgical History  Procedure Laterality Date  . Dilation and curettage of uterus  1987    for DQB  . Colonoscopy  3/02    polyps  . Other surgical history      Neg renal ultrasound  . Breast surgery Right 09/04/14    Mastectomy     Family History  Problem Relation Age of Onset  . Diabetes Father   . Hypertension Father   . Heart failure Father   . Coronary artery disease Father   . Hypertension Mother     Social History History  Substance Use Topics  . Smoking status: Never Smoker   . Smokeless tobacco: Never Used  . Alcohol Use: No    Allergies  Allergen Reactions  . Alendronate Sodium     FOSAMAX REACTION: body pain- severe all over    Current Outpatient Prescriptions  Medication Sig Dispense Refill  . benazepril (LOTENSIN) 40 MG tablet Take 1 tablet (40 mg total) by mouth daily. 90 tablet 3  . Calcium Carbonate-Vitamin D 600-400 MG-UNIT per tablet Take 2 tablets by mouth daily.      . carvedilol (COREG) 25 MG tablet Take 1 tablet (25 mg total) by mouth 2 (two) times daily. 180 tablet 3  . clonazePAM (KLONOPIN) 1 MG tablet Take 1 mg by mouth 3 (three) times daily as needed.   4  . hydrochlorothiazide (HYDRODIURIL) 25 MG tablet  Take 1 tablet (25 mg total) by mouth daily. 90 tablet 3  . letrozole (FEMARA) 2.5 MG tablet Take 1 tablet (2.5 mg total) by mouth daily. 30 tablet 11  . mirtazapine (REMERON) 15 MG tablet Take 15 mg by mouth at bedtime.     . potassium chloride (K-DUR,KLOR-CON) 10 MEQ tablet Take 1 tablet (10 mEq total) by mouth daily. 90 tablet 3  . simvastatin (ZOCOR) 20 MG tablet Take 1 tablet (20 mg total) by mouth at bedtime. 90 tablet 3  . traZODone (DESYREL) 100 MG tablet Take 1 tablet by mouth At bedtime.     No current facility-administered medications for this visit.    Review of Systems Review of Systems  Constitutional: Negative.   Respiratory: Negative.   Cardiovascular: Negative.     Blood pressure 142/74, pulse 76, resp. rate 12, height 5\' 3"  (1.6 m), weight 141 lb (63.957 kg).  Physical Exam Physical Exam  Constitutional: She is oriented to person, place, and time. She appears well-developed and well-nourished.  Neck: Neck supple.  Pulmonary/Chest:  2 ml fluid aspirated right chest wall.  Musculoskeletal:  excellent range of motion  Lymphadenopathy:  She has no cervical adenopathy.  Neurological: She is alert and oriented to person, place, and time.  Skin: Skin is warm and dry.      Assessment    Stage I carcinoma of the right breast. Low intermediate risk for recurrence on tamoxifen/AI alone.     Plan    The patient is released to full activity.     Follow up in May 2016 with office visit only.Marland Kitchen  PCP:  Tower, Roque Lias   [place], Shannon Stephens 11/07/2014, 7:09 AM

## 2014-11-05 NOTE — Patient Instructions (Signed)
Continue self breast exams. Call office for any new breast issues or concerns. 

## 2014-11-18 DIAGNOSIS — Z170 Estrogen receptor positive status [ER+]: Secondary | ICD-10-CM | POA: Diagnosis not present

## 2014-11-18 DIAGNOSIS — F4001 Agoraphobia with panic disorder: Secondary | ICD-10-CM | POA: Diagnosis not present

## 2014-11-18 DIAGNOSIS — C50311 Malignant neoplasm of lower-inner quadrant of right female breast: Secondary | ICD-10-CM | POA: Diagnosis not present

## 2014-11-18 DIAGNOSIS — Z79811 Long term (current) use of aromatase inhibitors: Secondary | ICD-10-CM | POA: Diagnosis not present

## 2014-11-18 DIAGNOSIS — Z79899 Other long term (current) drug therapy: Secondary | ICD-10-CM | POA: Diagnosis not present

## 2014-11-18 DIAGNOSIS — M8589 Other specified disorders of bone density and structure, multiple sites: Secondary | ICD-10-CM | POA: Diagnosis not present

## 2014-11-30 DIAGNOSIS — N390 Urinary tract infection, site not specified: Secondary | ICD-10-CM | POA: Diagnosis not present

## 2014-11-30 DIAGNOSIS — B349 Viral infection, unspecified: Secondary | ICD-10-CM | POA: Diagnosis not present

## 2014-12-01 ENCOUNTER — Ambulatory Visit: Payer: Self-pay | Admitting: Oncology

## 2014-12-20 DIAGNOSIS — J060 Acute laryngopharyngitis: Secondary | ICD-10-CM | POA: Diagnosis not present

## 2014-12-20 DIAGNOSIS — R05 Cough: Secondary | ICD-10-CM | POA: Diagnosis not present

## 2014-12-30 ENCOUNTER — Encounter: Payer: Self-pay | Admitting: Family Medicine

## 2014-12-30 ENCOUNTER — Ambulatory Visit (INDEPENDENT_AMBULATORY_CARE_PROVIDER_SITE_OTHER): Payer: Medicare Other | Admitting: Family Medicine

## 2014-12-30 ENCOUNTER — Other Ambulatory Visit (HOSPITAL_COMMUNITY)
Admission: RE | Admit: 2014-12-30 | Discharge: 2014-12-30 | Disposition: A | Discharge status: Home / Self Care | Payer: Medicare Other | Source: Ambulatory Visit | Attending: Family Medicine | Admitting: Family Medicine

## 2014-12-30 VITALS — BP 128/74 | HR 72 | Temp 98.1°F | Ht 63.0 in | Wt 136.1 lb

## 2014-12-30 DIAGNOSIS — Z23 Encounter for immunization: Secondary | ICD-10-CM

## 2014-12-30 DIAGNOSIS — I10 Essential (primary) hypertension: Secondary | ICD-10-CM | POA: Diagnosis not present

## 2014-12-30 DIAGNOSIS — Z01419 Encounter for gynecological examination (general) (routine) without abnormal findings: Secondary | ICD-10-CM

## 2014-12-30 DIAGNOSIS — E785 Hyperlipidemia, unspecified: Secondary | ICD-10-CM | POA: Diagnosis not present

## 2014-12-30 DIAGNOSIS — Z0000 Encounter for general adult medical examination without abnormal findings: Secondary | ICD-10-CM | POA: Diagnosis not present

## 2014-12-30 DIAGNOSIS — R739 Hyperglycemia, unspecified: Secondary | ICD-10-CM

## 2014-12-30 DIAGNOSIS — Z1211 Encounter for screening for malignant neoplasm of colon: Secondary | ICD-10-CM | POA: Diagnosis not present

## 2014-12-30 DIAGNOSIS — J069 Acute upper respiratory infection, unspecified: Secondary | ICD-10-CM | POA: Diagnosis not present

## 2014-12-30 MED ORDER — BENAZEPRIL HCL 40 MG PO TABS: 40.0000 mg | ORAL_TABLET | Freq: Every day | ORAL | Status: DC

## 2014-12-30 MED ORDER — CARVEDILOL 25 MG PO TABS: 25.0000 mg | ORAL_TABLET | Freq: Two times a day (BID) | ORAL | Status: DC

## 2014-12-30 MED ORDER — POTASSIUM CHLORIDE CRYS ER 10 MEQ PO TBCR
10.0000 meq | EXTENDED_RELEASE_TABLET | Freq: Every day | ORAL | Status: DC
Start: 2014-12-30 — End: 2017-08-02

## 2014-12-30 MED ORDER — SIMVASTATIN 20 MG PO TABS: 20.0000 mg | ORAL_TABLET | Freq: Every day | ORAL | Status: DC

## 2014-12-30 MED ORDER — HYDROCHLOROTHIAZIDE 25 MG PO TABS: 25.0000 mg | ORAL_TABLET | Freq: Every day | ORAL | Status: DC

## 2014-12-30 NOTE — Patient Instructions (Signed)
Pap today Try mucinex for congestion with lots of water -if no improvement let me know  Call when you are ready to schedule your colonoscopy prevnar vaccine today

## 2014-12-30 NOTE — Progress Notes (Signed)
Subjective:    Patient ID: Shannon Stephens, female    DOB: Jul 26, 1946, 69 y.o.   MRN: [id number]  HPI Here for annual medicare wellness visit as well as chronic/acute medical problems   Wt is down 5 lb with bmi of 81  Was dx with breast cancer in the fall Has had a R mastectomy On femara-no problems with it  Thinks she has done fairly well -it was a shock / did very well with the surgery   She has had a bad cold for 2 weeks  Went to a walk in clinic  They treated her -at that time more symptoms are nasal (given generic zyrtec)  Cough is not prod - there is phlegm in her throat  No facial pain No ST  No fever  Not blowing out yellow /green  Has not tried mucinex     I have personally reviewed the Medicare Annual Wellness questionnaire and have noted 1. The patient's medical and social history 2. Their use of alcohol, tobacco or illicit drugs 3. Their current medications and supplements 4. The patient's functional ability including ADL's, fall risks, home safety risks and hearing or visual             impairment. 5. Diet and physical activities 6. Evidence for depression or mood disorders  The patients weight, height, BMI have been recorded in the chart and visual acuity is per eye clinic.  I have made referrals, counseling and provided education to the patient based review of the above and I have provided the pt with a written personalized care plan for preventive services.  See scanned forms.  Routine anticipatory guidance given to patient.  See health maintenance. Colon cancer screening 4/05 - wants to wait until after she sees oncol in April  Breast cancer screening - onocology/ recent mastectomy  Self breast exam-no change  Flu vaccine 11/15 Tetanus vaccine 12/13 Pneumovax 2/15 , will do prevnar today Zoster vaccine 1/15  Advance directive=- has one (used to work in [employer] office)  Cognitive function addressed- see scanned forms- and if abnormal then additional  documentation follows.  No memory issues   Pap 2012 Still has all her parts     PMH and SH reviewed  Meds, vitals, and allergies reviewed.   ROS: See HPI.  Otherwise negative.      dexa 1/16- had at [facility] - does not have a result yet (will go for oncol appt soon and they will disc it_ One in 2012 was normal   bp is stable today  No cp or palpitations or headaches or edema  No side effects to medicines  BP Readings from Last 3 Encounters:  12/30/14 128/74  11/05/14 142/74  10/08/14 124/72     Due to check sugar and cholesterol  Diet has been fair (not a lot of appetite)   Patient Active Problem List   Diagnosis Date Noted  . Breast cancer 09/24/2014  . Malignant neoplasm of right breast 08/25/2014  . Encounter for Medicare annual wellness exam 12/27/2013  . Colon cancer screening 12/27/2013  . Abnormal EKG 01/17/2012  . Other screening mammogram 05/13/2011  . Hyperglycemia 05/13/2011  . Macrocytosis 05/13/2011  . Gynecological examination 05/13/2011  . Routine general medical examination at a health care facility 05/08/2011  . [place] DISEASE, LUMBAR 02/28/2008  . LIVEDO RETICULARIS 02/28/2008  . Hyperlipidemia 01/12/2007  . ANXIETY 01/12/2007  . Essential hypertension 01/12/2007  . ALLERGIC RHINITIS 01/12/2007  . SLEEP DISORDER 01/12/2007  Past Medical History  Diagnosis Date  . Allergic rhinitis   . Anxiety   . Hyperlipidemia 2003  . Hypertension   . Menopause   . Burning sensation     chronic,  skin  . History of ultrasound, pelvic 03/29/06    WNL  . Chronic cervicitis     with squamous metaplasia  . Breast cancer of lower-inner quadrant of right female breast November 2015    T1c, N0/ ER + ; PR+; Her 2 neu not overexpressed.   Past Surgical History  Procedure Laterality Date  . Dilation and curettage of uterus  1987    for DQB  . Colonoscopy  3/02    polyps  . Other surgical history      Neg renal ultrasound  . Breast surgery Right 09/04/14      Mastectomy    History  Substance Use Topics  . Smoking status: Never Smoker   . Smokeless tobacco: Never Used  . Alcohol Use: No   Family History  Problem Relation Age of Onset  . Diabetes Father   . Hypertension Father   . Heart failure Father   . Coronary artery disease Father   . Hypertension Mother    Allergies  Allergen Reactions  . Alendronate Sodium     FOSAMAX REACTION: body pain- severe all over   Current Outpatient Prescriptions on File Prior to Visit  Medication Sig Dispense Refill  . benazepril (LOTENSIN) 40 MG tablet Take 1 tablet (40 mg total) by mouth daily. 90 tablet 3  . Calcium Carbonate-Vitamin D 600-400 MG-UNIT per tablet Take 2 tablets by mouth daily.      . carvedilol (COREG) 25 MG tablet Take 1 tablet (25 mg total) by mouth 2 (two) times daily. 180 tablet 3  . clonazePAM (KLONOPIN) 1 MG tablet Take 1 mg by mouth 3 (three) times daily as needed.   4  . hydrochlorothiazide (HYDRODIURIL) 25 MG tablet Take 1 tablet (25 mg total) by mouth daily. 90 tablet 3  . letrozole (FEMARA) 2.5 MG tablet Take 1 tablet (2.5 mg total) by mouth daily. 30 tablet 11  . mirtazapine (REMERON) 15 MG tablet Take 15 mg by mouth at bedtime.     . potassium chloride (K-DUR,KLOR-CON) 10 MEQ tablet Take 1 tablet (10 mEq total) by mouth daily. 90 tablet 3  . simvastatin (ZOCOR) 20 MG tablet Take 1 tablet (20 mg total) by mouth at bedtime. 90 tablet 3  . traZODone (DESYREL) 100 MG tablet Take 1 tablet by mouth At bedtime.     No current facility-administered medications on file prior to visit.    Review of Systems Review of Systems  Constitutional: Negative for fever, appetite change, fatigue and unexpected weight change.  ENT pos for cong and rhinorrhea and drip / neg for sinus pain  Eyes: Negative for pain and visual disturbance.  Respiratory: Negative for cough and shortness of breath.   Cardiovascular: Negative for cp or palpitations    Gastrointestinal: Negative for nausea,  diarrhea and constipation.  Genitourinary: Negative for urgency and frequency.  Skin: Negative for pallor or rash   Neurological: Negative for weakness, light-headedness, numbness and headaches.  Hematological: Negative for adenopathy. Does not bruise/bleed easily.  Psychiatric/Behavioral: Negative for dysphoric mood. The patient is not nervous/anxious.         Objective:   Physical Exam  Constitutional: She appears well-developed and well-nourished. No distress.  HENT:  Head: Normocephalic and atraumatic.  Right Ear: External ear normal.  Left Ear:  External ear normal.  Mouth/Throat: Oropharynx is clear and moist.  Nares are injected and congested  Some post nasal drip  Clear rhinorrhea   Eyes: Conjunctivae and EOM are normal. Pupils are equal, round, and reactive to light. No scleral icterus.  Neck: Normal range of motion. Neck supple. No JVD present. Carotid bruit is not present. No thyromegaly present.  Cardiovascular: Normal rate, regular rhythm, normal heart sounds and intact distal pulses.  Exam reveals no gallop.   Pulmonary/Chest: Effort normal and breath sounds normal. No respiratory distress. She has no wheezes. She exhibits no tenderness.  Abdominal: Soft. Bowel sounds are normal. She exhibits no distension, no abdominal bruit and no mass. There is no tenderness.  Genitourinary: Rectum normal. There is no rash, tenderness or lesion on the right labia. There is no rash, tenderness or lesion on the left labia. Uterus is not enlarged and not tender. Cervix exhibits no motion tenderness, no discharge and no friability. Right adnexum displays no mass, no tenderness and no fullness. Left adnexum displays no mass, no tenderness and no fullness. No erythema, tenderness or bleeding in the vagina.  o External genitalia nl app o Urethral meatus nl app /no lesions o Urethra  Nl app/ mobile  o Bladder no tenderness o Vagina no d/c or bleeding/ mild atrophy o Cervix  Nl appearing/no  d/c o Uterus  Not enlarged or tender o Adnexa/parametria not enl or tender  o Anus and perineum not enl or tender  R breast exam: Breast exam: No mass, nodules, thickening, tenderness, bulging, retraction, inflamation, nipple discharge or skin changes noted.  No axillary or clavicular LA.     L mastectomy site-no changes    Musculoskeletal: Normal range of motion. She exhibits no edema or tenderness.  Lymphadenopathy:    She has no cervical adenopathy.  Neurological: She is alert. She has normal reflexes. She exhibits normal muscle tone. Coordination normal.  Skin: Skin is warm and dry. No rash noted. No erythema. No pallor.  Psychiatric: She has a normal mood and affect.          Assessment & Plan:   Problem List Items Addressed This Visit      Cardiovascular and Mediastinum   Essential hypertension    bp in fair control at this time  BP Readings from Last 1 Encounters:  12/30/14 128/74   No changes needed Disc lifstyle change with low sodium diet and exercise  Labs today      Relevant Medications   benazepril (LOTENSIN) tablet   carvedilol (COREG) tablet   hydrochlorothiazide tablet   simvastatin (ZOCOR) tablet   Other Relevant Orders   Comprehensive metabolic panel   CBC with Differential/Platelet   TSH   Lipid panel     Respiratory   Viral URI    Re assuring exam - getting better  Disc symptomatic care - see instructions on AVS  Fluids and rest  Recommended expectorant like mucinex otc to loosen congestion  Update if not starting to improve in a week or if worsening          Other   Colon cancer screening    Pt is due for a screening colonosc Given hx of current breast cancer - I advise it  She is not quite ready to schedule- will call back in several mo after her oncol f/u She plans to do it       Encounter for Medicare annual wellness exam - Primary    Reviewed health habits including diet  and exercise and skin cancer prevention Reviewed  appropriate screening tests for age  Also reviewed health mt list, fam hx and immunization status , as well as social and family history    See HPI Labs ordered prevnar vaccine today  She will call when ready to schedule her colonoscopy for screening       Encounter for routine gynecological examination    Exam done  Pt with recent breast cancer  Pap done  No complaints       Relevant Orders   Cytology - PAP   Hyperglycemia    Lab Results  Component Value Date   HGBA1C 5.7 12/27/2013    Check this today  Disc low glycemic diet        Relevant Orders   Hemoglobin A1c   Hyperlipidemia    Lipid panel today  Rev low sat fat diet  On simvastatin and diet      Relevant Medications   benazepril (LOTENSIN) tablet   carvedilol (COREG) tablet   hydrochlorothiazide tablet   simvastatin (ZOCOR) tablet   Other Relevant Orders   Lipid panel    Other Visit Diagnoses    Need for pneumococcal vaccination        Relevant Orders    Pneumococcal conjugate vaccine 13-valent IM (Completed)

## 2014-12-30 NOTE — Progress Notes (Signed)
Pre visit review using our clinic review tool, if applicable. No additional management support is needed unless otherwise documented below in the visit note. 

## 2014-12-31 DIAGNOSIS — J069 Acute upper respiratory infection, unspecified: Secondary | ICD-10-CM | POA: Insufficient documentation

## 2014-12-31 LAB — CBC WITH DIFFERENTIAL/PLATELET
BASOS ABS: 0 10*3/uL (ref 0.0–0.1)
Basophils Relative: 0.9 % (ref 0.0–3.0)
EOS ABS: 0.2 10*3/uL (ref 0.0–0.7)
Eosinophils Relative: 3.7 % (ref 0.0–5.0)
HCT: 41.4 % (ref 36.0–46.0)
Hemoglobin: 14.4 g/dL (ref 12.0–15.0)
LYMPHS PCT: 42 % (ref 12.0–46.0)
Lymphs Abs: 2.4 10*3/uL (ref 0.7–4.0)
MCHC: 34.7 g/dL (ref 30.0–36.0)
MCV: 92.8 fl (ref 78.0–100.0)
Monocytes Absolute: 0.4 10*3/uL (ref 0.1–1.0)
Monocytes Relative: 6.7 % (ref 3.0–12.0)
NEUTROS PCT: 46.7 % (ref 43.0–77.0)
Neutro Abs: 2.6 10*3/uL (ref 1.4–7.7)
Platelets: 335 10*3/uL (ref 150.0–400.0)
RBC: 4.46 Mil/uL (ref 3.87–5.11)
RDW: 13.5 % (ref 11.5–15.5)
WBC: 5.6 10*3/uL (ref 4.0–10.5)

## 2014-12-31 LAB — COMPREHENSIVE METABOLIC PANEL
ALK PHOS: 57 U/L (ref 39–117)
ALT: 23 U/L (ref 0–35)
AST: 20 U/L (ref 0–37)
Albumin: 4.6 g/dL (ref 3.5–5.2)
BUN: 16 mg/dL (ref 6–23)
CO2: 33 mEq/L — ABNORMAL HIGH (ref 19–32)
Calcium: 10.9 mg/dL — ABNORMAL HIGH (ref 8.4–10.5)
Chloride: 101 mEq/L (ref 96–112)
Creatinine, Ser: 0.94 mg/dL (ref 0.40–1.20)
GFR: 62.88 mL/min (ref >60.00)
Glucose, Bld: 107 mg/dL — ABNORMAL HIGH (ref 70–99)
POTASSIUM: 4 meq/L (ref 3.5–5.1)
SODIUM: 140 meq/L (ref 135–145)
Total Bilirubin: 0.5 mg/dL (ref 0.2–1.2)
Total Protein: 8.1 g/dL (ref 6.0–8.3)

## 2014-12-31 LAB — LIPID PANEL
CHOLESTEROL: 223 mg/dL — AB (ref 0–200)
HDL: 60.6 mg/dL (ref >39.00)
LDL Cholesterol: 125 mg/dL — ABNORMAL HIGH (ref 0–99)
NonHDL: 162.4
Total CHOL/HDL Ratio: 4
Triglycerides: 186 mg/dL — ABNORMAL HIGH (ref 0.0–149.0)
VLDL: 37.2 mg/dL (ref 0.0–40.0)

## 2014-12-31 LAB — HEMOGLOBIN A1C: Hgb A1c MFr Bld: 5.8 % (ref 4.6–6.5)

## 2014-12-31 LAB — TSH: TSH: 1.43 u[IU]/mL (ref 0.35–4.50)

## 2014-12-31 NOTE — Assessment & Plan Note (Signed)
bp in fair control at this time  BP Readings from Last 1 Encounters:  12/30/14 128/74   No changes needed Disc lifstyle change with low sodium diet and exercise  Labs today

## 2014-12-31 NOTE — Assessment & Plan Note (Signed)
Re assuring exam - getting better  Disc symptomatic care - see instructions on AVS  Fluids and rest  Recommended expectorant like mucinex otc to loosen congestion  Update if not starting to improve in a week or if worsening

## 2014-12-31 NOTE — Assessment & Plan Note (Addendum)
Reviewed health habits including diet and exercise and skin cancer prevention Reviewed appropriate screening tests for age  Also reviewed health mt list, fam hx and immunization status , as well as social and family history    See HPI Labs ordered prevnar vaccine today  She will call when ready to schedule her colonoscopy for screening

## 2014-12-31 NOTE — Assessment & Plan Note (Signed)
Exam done  Pt with recent breast cancer  Pap done  No complaints

## 2014-12-31 NOTE — Assessment & Plan Note (Signed)
Lab Results  Component Value Date   HGBA1C 5.7 12/27/2013    Check this today  Disc low glycemic diet

## 2014-12-31 NOTE — Assessment & Plan Note (Signed)
Lipid panel today  Rev low sat fat diet  On simvastatin and diet

## 2014-12-31 NOTE — Assessment & Plan Note (Signed)
Pt is due for a screening colonosc Given hx of current breast cancer - I advise it  She is not quite ready to schedule- will call back in several mo after her oncol f/u She plans to do it

## 2015-01-02 LAB — CYTOLOGY - PAP

## 2015-02-11 ENCOUNTER — Ambulatory Visit
Admit: 2015-02-11 | Disposition: A | Discharge status: Home / Self Care | Payer: Self-pay | Attending: Oncology | Admitting: Oncology

## 2015-02-11 DIAGNOSIS — C50311 Malignant neoplasm of lower-inner quadrant of right female breast: Secondary | ICD-10-CM | POA: Diagnosis not present

## 2015-02-11 DIAGNOSIS — Z79899 Other long term (current) drug therapy: Secondary | ICD-10-CM | POA: Diagnosis not present

## 2015-02-11 DIAGNOSIS — Z79811 Long term (current) use of aromatase inhibitors: Secondary | ICD-10-CM | POA: Diagnosis not present

## 2015-02-11 DIAGNOSIS — Z170 Estrogen receptor positive status [ER+]: Secondary | ICD-10-CM | POA: Diagnosis not present

## 2015-02-11 LAB — CBC CANCER CENTER
BASOS PCT: 0.7 %
Basophil #: 0 x10 3/mm (ref 0.0–0.1)
Eosinophil #: 0.1 x10 3/mm (ref 0.0–0.7)
Eosinophil %: 2.6 %
HCT: 39.4 % (ref 35.0–47.0)
HGB: 13.3 g/dL (ref 12.0–16.0)
Lymphocyte #: 1.8 x10 3/mm (ref 1.0–3.6)
Lymphocyte %: 38.3 %
MCH: 32.5 pg (ref 26.0–34.0)
MCHC: 33.7 g/dL (ref 32.0–36.0)
MCV: 96 fL (ref 80–100)
MONO ABS: 0.4 x10 3/mm (ref 0.2–0.9)
MONOS PCT: 9.5 %
NEUTROS PCT: 48.9 %
Neutrophil #: 2.3 x10 3/mm (ref 1.4–6.5)
Platelet: 254 x10 3/mm (ref 150–440)
RBC: 4.09 10*6/uL (ref 3.80–5.20)
RDW: 14 % (ref 11.5–14.5)
WBC: 4.7 x10 3/mm (ref 3.6–11.0)

## 2015-02-21 NOTE — Op Note (Signed)
PATIENT NAME:  Shannon Stephens, NETZLEY MR#:  [id number] DATE OF BIRTH:  09/14/1946  DATE OF PROCEDURE:  09/04/2014  PREOPERATIVE DIAGNOSIS: Right breast cancer.   POSTOPERATIVE DIAGNOSIS: Right breast cancer.   OPERATIVE PROCEDURE: Right simple mastectomy with sentinel node biopsy.   SURGEON: Hervey Ard, MD  ANESTHESIA: General by LMA under Dr. Andree Elk.   ESTIMATED BLOOD LOSS: 25 mL.   CLINICAL NOTE: This 69 year old woman was recently identified with a cancer in the lower inner quadrant of the right breast. She has expressed a desire to proceed to mastectomy.   OPERATIVE NOTE: With the patient under adequate general anesthesia and having received Kefzol prior to the procedure and undergoing injection with technetium sulfur colloid prior to coming to the operating room, she had 6 mL of saline diluted 2:1 with methylene blue injected in the subareolar plexus. The breast was then prepped with ChloraPrep and draped. Because of the very peripheral location at the 4 o'clock position near the edge of the inframammary fold, an oblique incision from the axillary area to the costal margin was chosen. An elliptical incision was utilized. Flaps were elevated with the following boundaries: The medial border was the sternum, superior border was the clavicle, the inferior border was the rectus fashion, and the lateral border was the serratus muscle. Flaps were elevated with electrocautery. Hemostasis was with 3-0 Vicryl ties as needed. The single hot blue node was identified in the axilla and this was sent for frozen section with touch prep showing no evidence of metastatic disease. Additional scanning through the axilla showed no other areas of increased uptake and no palpable lesions. After the breast was elevated off the underlying muscle, taking the fascia of the muscle with the specimen, it was sent fresh for pathology per protocol. The wound was irrigated with sterile water. A Blake drain was brought out through  the lower medial flap and anchored in place with 3-0 nylon. The skin flaps were then approximated with a running 2-0 Vicryl deep dermal suture. Benzoin and Steri-Strips followed by Telfa, fluff gauze, Kerlix and an Ace wrap were then applied.   The patient tolerated the procedure well and was taken to the recovery room in stable condition.   ____________________________ Robert Bellow, MD jwb:sb D: 09/04/2014 13:11:27 ET T: 09/04/2014 14:07:50 ET JOB#: [id number]  cc: Robert Bellow, MD, <Dictator> Marne A. Glori Bickers, MD Drayven Marchena Amedeo Kinsman MD ELECTRONICALLY SIGNED 09/08/2014 20:12

## 2015-02-23 LAB — SURGICAL PATHOLOGY

## 2015-03-05 ENCOUNTER — Telehealth: Payer: Self-pay | Admitting: Family Medicine

## 2015-03-05 DIAGNOSIS — Z1211 Encounter for screening for malignant neoplasm of colon: Secondary | ICD-10-CM

## 2015-03-05 NOTE — Telephone Encounter (Signed)
Ref done  

## 2015-03-05 NOTE — Addendum Note (Signed)
Addended by: Loura Pardon A on: 03/05/2015 10:57 AM   Modules accepted: Orders

## 2015-03-05 NOTE — Telephone Encounter (Signed)
Patient said she was told to call and ask for a Colonoscopy to be scheduled.  She said it has been 10 years since her last Colonoscopy was done at [facility].  Patient wants to have the Colonoscopy done in [place] by [facility].  She said since her husband passed away she doesn't like to travel to [place].  Patient said she can't go on 04/21/15 or 06/17/15.

## 2015-03-09 ENCOUNTER — Encounter: Payer: Self-pay | Admitting: Gastroenterology

## 2015-03-17 ENCOUNTER — Ambulatory Visit: Payer: Self-pay | Admitting: General Surgery

## 2015-03-23 ENCOUNTER — Encounter: Payer: Self-pay | Admitting: General Surgery

## 2015-03-23 ENCOUNTER — Ambulatory Visit (INDEPENDENT_AMBULATORY_CARE_PROVIDER_SITE_OTHER): Payer: Medicare Other | Admitting: General Surgery

## 2015-03-23 VITALS — BP 124/56 | HR 74 | Resp 14 | Ht 63.0 in | Wt 135.0 lb

## 2015-03-23 DIAGNOSIS — C50911 Malignant neoplasm of unspecified site of right female breast: Secondary | ICD-10-CM | POA: Diagnosis not present

## 2015-03-23 NOTE — Progress Notes (Signed)
Patient ID: Shannon Stephens, female   DOB: 01/21/1946, 69 y.o.   MRN: [id number]  Chief Complaint  Patient presents with  . Follow-up    breast cancer    HPI Shannon Stephens is a 69 y.o. female Here today for breast cancer follow up. No new breast complaints. The patient reports tolerating Femara well.  She is being followed by Dr. Oliva Bustard from medical oncology.   HPI  Past Medical History  Diagnosis Date  . Allergic rhinitis   . Anxiety   . Hyperlipidemia 2003  . Hypertension   . Menopause   . Burning sensation     chronic,  skin  . History of ultrasound, pelvic 03/29/06    WNL  . Chronic cervicitis     with squamous metaplasia  . Breast cancer of lower-inner quadrant of right female breast November 2015    T1c, N0/ ER + ; PR+; Her 2 neu not overexpressed. Oncotype DX: 14 % recurrence risk w/ antiestrogen alone (low intermediate group).    Past Surgical History  Procedure Laterality Date  . Dilation and curettage of uterus  1987    for DQB  . Colonoscopy  3/02    polyps  . Other surgical history      Neg renal ultrasound  . Breast surgery Right 09/04/14    Mastectomy     Family History  Problem Relation Age of Onset  . Diabetes Father   . Hypertension Father   . Heart failure Father   . Coronary artery disease Father   . Hypertension Mother     Social History History  Substance Use Topics  . Smoking status: Never Smoker   . Smokeless tobacco: Never Used  . Alcohol Use: No    Allergies  Allergen Reactions  . Alendronate Sodium     FOSAMAX REACTION: body pain- severe all over    Current Outpatient Prescriptions  Medication Sig Dispense Refill  . benazepril (LOTENSIN) 40 MG tablet Take 1 tablet (40 mg total) by mouth daily. 90 tablet 3  . Calcium Carbonate-Vitamin D 600-400 MG-UNIT per tablet Take 2 tablets by mouth daily.      . carvedilol (COREG) 25 MG tablet Take 1 tablet (25 mg total) by mouth 2 (two) times daily. 180 tablet 3  . clonazePAM (KLONOPIN) 1  MG tablet Take 1 mg by mouth 3 (three) times daily as needed.   4  . hydrochlorothiazide (HYDRODIURIL) 25 MG tablet Take 1 tablet (25 mg total) by mouth daily. 90 tablet 3  . letrozole (FEMARA) 2.5 MG tablet Take 1 tablet (2.5 mg total) by mouth daily. 30 tablet 11  . mirtazapine (REMERON) 15 MG tablet Take 15 mg by mouth at bedtime.     . potassium chloride (K-DUR,KLOR-CON) 10 MEQ tablet Take 1 tablet (10 mEq total) by mouth daily. 90 tablet 3  . simvastatin (ZOCOR) 20 MG tablet Take 1 tablet (20 mg total) by mouth at bedtime. 90 tablet 3  . traZODone (DESYREL) 100 MG tablet Take 1 tablet by mouth At bedtime.     No current facility-administered medications for this visit.    Review of Systems Review of Systems  Constitutional: Negative.   Respiratory: Negative.   Cardiovascular: Negative.     Blood pressure 124/56, pulse 74, resp. rate 14, height 5\' 3"  (1.6 m), weight 135 lb (61.236 kg).  Physical Exam Physical Exam  Constitutional: She is oriented to person, place, and time. She appears well-developed and well-nourished.  Eyes: Conjunctivae are normal.  No scleral icterus.  Neck: Neck supple.  Cardiovascular: Normal rate, regular rhythm and normal heart sounds.   Pulmonary/Chest: Effort normal and breath sounds normal. Left breast exhibits no inverted nipple, no mass, no nipple discharge, no skin change and no tenderness.    Right mastectomy site is well healed   Lymphadenopathy:    She has no cervical adenopathy.  Neurological: She is alert and oriented to person, place, and time.  Skin: Skin is warm and dry.    Data Reviewed PCP notes. Patient is overdue for screening colonoscopy.   Assessment    Doing well s/p right mastectomy.     Plan    Patient to return in six months left screening breast mammogram.  Will discuss colonoscopy with patient at follow up if not already completed.       PCP:  Gerald Dexter, Forest Gleason 03/23/2015, 8:58 PM

## 2015-03-23 NOTE — Patient Instructions (Signed)
Patient to return in six months left screening breast mammogram

## 2015-04-30 DIAGNOSIS — F4001 Agoraphobia with panic disorder: Secondary | ICD-10-CM | POA: Diagnosis not present

## 2015-05-12 ENCOUNTER — Ambulatory Visit (AMBULATORY_SURGERY_CENTER): Payer: Self-pay

## 2015-05-12 VITALS — Ht 62.0 in | Wt 133.4 lb

## 2015-05-12 DIAGNOSIS — Z860100 Personal history of colon polyps, unspecified: Secondary | ICD-10-CM

## 2015-05-12 MED ORDER — SUPREP BOWEL PREP KIT 17.5-3.13-1.6 GM/177ML PO SOLN: 1.0000 | Freq: Once | ORAL | Status: DC

## 2015-05-12 NOTE — Progress Notes (Signed)
No allergies to eggs or soy No diet/weight loss meds No home oxygen No past problems with anesthesia  Does not want emmi video; has  Had colon before

## 2015-05-26 ENCOUNTER — Encounter: Payer: Self-pay | Admitting: Gastroenterology

## 2015-05-26 ENCOUNTER — Ambulatory Visit (AMBULATORY_SURGERY_CENTER): Payer: Medicare Other | Admitting: Gastroenterology

## 2015-05-26 VITALS — BP 146/75 | HR 75 | Temp 97.4°F | Resp 16 | Ht 62.0 in | Wt 133.0 lb

## 2015-05-26 DIAGNOSIS — K635 Polyp of colon: Secondary | ICD-10-CM

## 2015-05-26 DIAGNOSIS — Z86010 Personal history of colonic polyps: Secondary | ICD-10-CM | POA: Diagnosis present

## 2015-05-26 DIAGNOSIS — I10 Essential (primary) hypertension: Secondary | ICD-10-CM | POA: Diagnosis not present

## 2015-05-26 DIAGNOSIS — D120 Benign neoplasm of cecum: Secondary | ICD-10-CM | POA: Diagnosis not present

## 2015-05-26 DIAGNOSIS — M545 Low back pain: Secondary | ICD-10-CM | POA: Diagnosis not present

## 2015-05-26 DIAGNOSIS — F329 Major depressive disorder, single episode, unspecified: Secondary | ICD-10-CM | POA: Diagnosis not present

## 2015-05-26 HISTORY — PX: COLONOSCOPY: SHX174

## 2015-05-26 MED ORDER — SODIUM CHLORIDE 0.9 % IV SOLN: 500.0000 mL | INTRAVENOUS | Status: DC

## 2015-05-26 NOTE — Progress Notes (Signed)
IV started to right wrist before patient stated had a right mastectomy. Stated she was never told not to use right side. Iv restarted to left anticubical and discontinue to right site.

## 2015-05-26 NOTE — Op Note (Signed)
[place]  [address]. [place], [id number]   COLONOSCOPY PROCEDURE REPORT  PATIENT: Shannon Stephens, Shannon Stephens  MR#: [id number] BIRTHDATE: 21-Oct-1946 , 68  yrs. old GENDER: female ENDOSCOPIST: Inda Castle, MD REFERRED HT:[id number] Vernell Morgans, M.D. PROCEDURE DATE:  05/26/2015 PROCEDURE:   Colonoscopy, screening and Colonoscopy with snare polypectomy First Screening Colonoscopy - Avg.  risk and is 50 yrs.  old or older - No.  Prior Negative Screening - Now for repeat screening. N/A  History of Adenoma - Now for follow-up colonoscopy & has been > or = to 3 yrs.  Yes hx of adenoma.  Has been 3 or more years since last colonoscopy.  Polyps removed today? Yes ASA CLASS:   Class II INDICATIONS:PH Colon Adenoma.  2002 MEDICATIONS: Monitored anesthesia care and Propofol 170 mg IV  DESCRIPTION OF PROCEDURE:   After the risks benefits and alternatives of the procedure were thoroughly explained, informed consent was obtained.  The digital rectal exam revealed no abnormalities of the rectum.   The [id number]  endoscope was introduced through the anus and advanced to the cecum, which was identified by both the appendix and ileocecal valve. No adverse events experienced.   The quality of the prep was (Suprep was used) excellent.  The instrument was then slowly withdrawn as the colon was fully examined. Estimated blood loss is zero unless otherwise noted in this procedure report.      COLON FINDINGS: A sessile polyp measuring 3 mm in size was found at the cecum.  A polypectomy was performed with a cold snare.  The resection was complete, the polyp tissue was completely retrieved and sent to histology.   The examination was otherwise normal. Retroflexed views revealed no abnormalities. The time to cecum = 2.6 Withdrawal time = 8.9   The scope was withdrawn and the procedure completed. COMPLICATIONS: There were no immediate complications.  ENDOSCOPIC IMPRESSION: 1.    Sessile polyp was found at the cecum; polypectomy was performed with a cold snare 2.   The examination was otherwise normal  RECOMMENDATIONS: If the polyp(s) removed today are proven to be adenomatous (pre-cancerous) polyps, you will need a repeat colonoscopy in 5 years.  Otherwise you should continue to follow colorectal cancer screening guidelines for "routine risk" patients with colonoscopy in 10 years.  You will receive a letter within 1-2 weeks with the results of your biopsy as well as final recommendations.  Please call my office if you have not received a letter after 3 weeks.  eSigned:  Inda Castle, MD 05/26/2015 11:10 AM   cc:   PATIENT NAME:  [name], [name] MR#: [id number]

## 2015-05-26 NOTE — Progress Notes (Signed)
Called to room to assist during endoscopic procedure.  Patient ID and intended procedure confirmed with present staff. Received instructions for my participation in the procedure from the performing physician.  

## 2015-05-26 NOTE — Progress Notes (Signed)
Report to PACU, RN, vss, BBS= Clear.  

## 2015-05-26 NOTE — Patient Instructions (Signed)
YOU HAD AN ENDOSCOPIC PROCEDURE TODAY AT THE [facility]:   Refer to the procedure report that was given to you for any specific questions about what was found during the examination.  If the procedure report does not answer your questions, please call your gastroenterologist to clarify.  If you requested that your care partner not be given the details of your procedure findings, then the procedure report has been included in a sealed envelope for you to review at your convenience later.  YOU SHOULD EXPECT: Some feelings of bloating in the abdomen. Passage of more gas than usual.  Walking can help get rid of the air that was put into your GI tract during the procedure and reduce the bloating. If you had a lower endoscopy (such as a colonoscopy or flexible sigmoidoscopy) you may notice spotting of blood in your stool or on the toilet paper. If you underwent a bowel prep for your procedure, you may not have a normal bowel movement for a few days.  Please Note:  You might notice some irritation and congestion in your nose or some drainage.  This is from the oxygen used during your procedure.  There is no need for concern and it should clear up in a day or so.  SYMPTOMS TO REPORT IMMEDIATELY:   Following lower endoscopy (colonoscopy or flexible sigmoidoscopy):  Excessive amounts of blood in the stool  Significant tenderness or worsening of abdominal pains  Swelling of the abdomen that is new, acute  Fever of 100F or higher   For urgent or emergent issues, a gastroenterologist can be reached at any hour by calling [phone].   DIET: Your first meal following the procedure should be a small meal and then it is ok to progress to your normal diet. Heavy or fried foods are harder to digest and may make you feel nauseous or bloated.  Likewise, meals heavy in dairy and vegetables can increase bloating.  Drink plenty of fluids but you should avoid alcoholic beverages for 24  hours.  ACTIVITY:  You should plan to take it easy for the rest of today and you should NOT DRIVE or use heavy machinery until tomorrow (because of the sedation medicines used during the test).    FOLLOW UP: Our staff will call the number listed on your records the next business day following your procedure to check on you and address any questions or concerns that you may have regarding the information given to you following your procedure. If we do not reach you, we will leave a message.  However, if you are feeling well and you are not experiencing any problems, there is no need to return our call.  We will assume that you have returned to your regular daily activities without incident.  If any biopsies were taken you will be contacted by phone or by letter within the next 1-3 weeks.  Please call us at [phone] if you have not heard about the biopsies in 3 weeks.    SIGNATURES/CONFIDENTIALITY: You and/or your care partner have signed paperwork which will be entered into your electronic medical record.  These signatures attest to the fact that that the information above on your After Visit Summary has been reviewed and is understood.  Full responsibility of the confidentiality of this discharge information lies with you and/or your care-partner.    Resume medications. Information given on polyps. 

## 2015-05-27 ENCOUNTER — Telehealth: Payer: Self-pay

## 2015-05-27 NOTE — Telephone Encounter (Signed)
Left a message for pt at [phone] for the pt to call us back if any questions or concerns. maw

## 2015-06-01 ENCOUNTER — Encounter: Payer: Self-pay | Admitting: Gastroenterology

## 2015-06-12 ENCOUNTER — Other Ambulatory Visit: Payer: Self-pay | Admitting: *Deleted

## 2015-06-12 DIAGNOSIS — C50911 Malignant neoplasm of unspecified site of right female breast: Secondary | ICD-10-CM

## 2015-06-17 ENCOUNTER — Inpatient Hospital Stay (HOSPITAL_BASED_OUTPATIENT_CLINIC_OR_DEPARTMENT_OTHER): Payer: Medicare Other | Admitting: Oncology

## 2015-06-17 ENCOUNTER — Inpatient Hospital Stay: Payer: Medicare Other | Attending: Oncology

## 2015-06-17 VITALS — BP 167/94 | HR 78 | Temp 96.4°F | Wt 131.4 lb

## 2015-06-17 DIAGNOSIS — C50311 Malignant neoplasm of lower-inner quadrant of right female breast: Secondary | ICD-10-CM | POA: Diagnosis not present

## 2015-06-17 DIAGNOSIS — C50911 Malignant neoplasm of unspecified site of right female breast: Secondary | ICD-10-CM

## 2015-06-17 DIAGNOSIS — E785 Hyperlipidemia, unspecified: Secondary | ICD-10-CM | POA: Insufficient documentation

## 2015-06-17 DIAGNOSIS — Z79811 Long term (current) use of aromatase inhibitors: Secondary | ICD-10-CM | POA: Insufficient documentation

## 2015-06-17 DIAGNOSIS — F419 Anxiety disorder, unspecified: Secondary | ICD-10-CM | POA: Diagnosis not present

## 2015-06-17 DIAGNOSIS — I10 Essential (primary) hypertension: Secondary | ICD-10-CM | POA: Diagnosis not present

## 2015-06-17 DIAGNOSIS — Z170 Estrogen receptor positive status [ER+]: Secondary | ICD-10-CM

## 2015-06-17 DIAGNOSIS — Z79899 Other long term (current) drug therapy: Secondary | ICD-10-CM | POA: Diagnosis not present

## 2015-06-17 LAB — CBC WITH DIFFERENTIAL/PLATELET
BASOS ABS: 0.1 10*3/uL (ref 0–0.1)
Basophils Relative: 1 %
Eosinophils Absolute: 0.7 10*3/uL (ref 0–0.7)
Eosinophils Relative: 13 %
HCT: 41.4 % (ref 35.0–47.0)
Hemoglobin: 14.1 g/dL (ref 12.0–16.0)
Lymphocytes Relative: 31 %
Lymphs Abs: 1.6 10*3/uL (ref 1.0–3.6)
MCH: 33.3 pg (ref 26.0–34.0)
MCHC: 34.1 g/dL (ref 32.0–36.0)
MCV: 97.6 fL (ref 80.0–100.0)
MONO ABS: 0.4 10*3/uL (ref 0.2–0.9)
Monocytes Relative: 8 %
NEUTROS PCT: 47 %
Neutro Abs: 2.5 10*3/uL (ref 1.4–6.5)
Platelets: 254 10*3/uL (ref 150–440)
RBC: 4.24 MIL/uL (ref 3.80–5.20)
RDW: 13.3 % (ref 11.5–14.5)
WBC: 5.3 10*3/uL (ref 3.6–11.0)

## 2015-06-17 LAB — COMPREHENSIVE METABOLIC PANEL
ALT: 25 U/L (ref 14–54)
AST: 28 U/L (ref 15–41)
Albumin: 4.5 g/dL (ref 3.5–5.0)
Alkaline Phosphatase: 61 U/L (ref 38–126)
Anion gap: 5 (ref 5–15)
BILIRUBIN TOTAL: 0.6 mg/dL (ref 0.3–1.2)
BUN: 22 mg/dL — AB (ref 6–20)
CHLORIDE: 101 mmol/L (ref 101–111)
CO2: 31 mmol/L (ref 22–32)
Calcium: 9.4 mg/dL (ref 8.9–10.3)
Creatinine, Ser: 0.89 mg/dL (ref 0.44–1.00)
GFR calc Af Amer: >60 mL/min (ref >60)
GFR calc non Af Amer: >60 mL/min (ref >60)
Glucose, Bld: 134 mg/dL — ABNORMAL HIGH (ref 65–99)
POTASSIUM: 3.6 mmol/L (ref 3.5–5.1)
Sodium: 137 mmol/L (ref 135–145)
TOTAL PROTEIN: 7.8 g/dL (ref 6.5–8.1)

## 2015-06-17 NOTE — Progress Notes (Signed)
Patient does have living will. Never smoked. 

## 2015-06-18 LAB — CANCER ANTIGEN 27.29: CA 27.29: 24.1 U/mL (ref 0.0–38.6)

## 2015-06-28 ENCOUNTER — Encounter: Payer: Self-pay | Admitting: Oncology

## 2015-06-28 NOTE — Progress Notes (Signed)
[place] @ [facility] Telephone:([phone]  Fax:([phone]) [place]: [birth year]  MR#: [id number]  FYB#:[id number]  Patient Care Team: Abner Greenspan, MD as PCP - General Robert Bellow, MD (General Surgery) Abner Greenspan, MD as Consulting Physician (Family Medicine)  CHIEF COMPLAINT:  Oncology history Chief Complaint  Patient presents with  . Follow-up   1.carcinoma of breast at 4:00 position at the right breast status post simple mastectomy and anterior lymph node evaluation Right simple mastectomy done on September 04, 2014 T1c N0 M0 tumor. estrogen receptor positive progesterone receptor positive HER-2 receptor not overexpressed. Oncotype DX score 22.  10 year distant recurrence rate 14% with tamoxifen alone. Patient started on letrozole    INTERVAL HISTORY:  69 year old lady was getting regular mammogram done.  Recently on October 28 patient had abnormal mammogram.  At 4:00 position in the right breast.  Patient underwent core biopsy which was positive for invasive carcinoma.  Patient underwent mastectomy.  1.3 cm tumor with negative lymph node.  Grade 1.  Estrogen receptor positive and progesterone receptor positive and HER-2/neu receptor negative.  Patient has is related to [place] DX score and was referred to me for further evaluation and treatment consideration.  Patient was started on letrozole day after surgery patient is tolerating very well.  Taking calcium and vitamin D. Slightly apprehensive to but not any acute distress Patient is here for ongoing evaluation and treatment consideration.  Tolerating letrozole very well without any significant side effect.  Getting regular mammograms done  REVIEW OF SYSTEMS:   GENERAL:  Feels good.  Active.  No fevers, sweats or weight loss. PERFORMANCE STATUS (ECOG):  0 HEENT:  No visual changes, runny nose, sore throat, mouth sores or tenderness. Lungs: No shortness of breath or cough.  No  hemoptysis. Cardiac:  No chest pain, palpitations, orthopnea, or PND. GI:  No nausea, vomiting, diarrhea, constipation, melena or hematochezia. GU:  No urgency, frequency, dysuria, or hematuria. Musculoskeletal:  No back pain.  No joint pain.  No muscle tenderness. Extremities:  No pain or swelling. Skin:  No rashes or skin changes. Neuro:  No headache, numbness or weakness, balance or coordination issues. Endocrine:  No diabetes, thyroid issues, hot flashes or night sweats. Psych:  No mood changes, depression or anxiety. Pain:  No focal pain. Review of systems:  All other systems reviewed and found to be negative. As per HPI. Otherwise, a complete review of systems is negatve.  PAST MEDICAL HISTORY: Past Medical History  Diagnosis Date  . Allergic rhinitis   . Anxiety   . Hyperlipidemia 2003  . Hypertension   . Menopause   . Burning sensation     chronic,  skin  . History of ultrasound, pelvic 03/29/06    WNL  . Chronic cervicitis     with squamous metaplasia  . Breast cancer of lower-inner quadrant of right female breast November 2015    T1c, N0/ ER + ; PR+; Her 2 neu not overexpressed. Oncotype DX: 14 % recurrence risk w/ antiestrogen alone (low intermediate group).    PAST SURGICAL HISTORY: Past Surgical History  Procedure Laterality Date  . Dilation and curettage of uterus  1987    for DQB  . Colonoscopy  3/02    polyps  . Other surgical history      Neg renal ultrasound  . Breast surgery Right 09/04/14    Mastectomy     FAMILY HISTORY Family History  Problem Relation  Age of Onset  . Diabetes Father   . Hypertension Father   . Heart failure Father   . Coronary artery disease Father   . Hypertension Mother   . Colon cancer Neg Hx   . Colon polyps Neg Hx   . Pancreatic cancer Neg Hx   . Rectal cancer Neg Hx   . Stomach cancer Neg Hx   . Ulcerative colitis Neg Hx   . Esophageal cancer Neg Hx   . Crohn's disease Neg Hx     ADVANCED DIRECTIVES:  Patient  does have advance healthcare directive, Patient   does not desire to make any changes HEALTH MAINTENANCE: Social History  Substance Use Topics  . Smoking status: Never Smoker   . Smokeless tobacco: Never Used  . Alcohol Use: No      Allergies  Allergen Reactions  . Alendronate Sodium     FOSAMAX REACTION: body pain- severe all over    Current Outpatient Prescriptions  Medication Sig Dispense Refill  . benazepril (LOTENSIN) 40 MG tablet Take 1 tablet (40 mg total) by mouth daily. 90 tablet 3  . Calcium Carbonate-Vitamin D 600-400 MG-UNIT per tablet Take 2 tablets by mouth daily.      . carvedilol (COREG) 25 MG tablet Take 1 tablet (25 mg total) by mouth 2 (two) times daily. 180 tablet 3  . clonazePAM (KLONOPIN) 1 MG tablet Take 1 mg by mouth 3 (three) times daily as needed.   4  . hydrochlorothiazide (HYDRODIURIL) 25 MG tablet Take 1 tablet (25 mg total) by mouth daily. 90 tablet 3  . letrozole (FEMARA) 2.5 MG tablet Take 1 tablet (2.5 mg total) by mouth daily. 30 tablet 11  . mirtazapine (REMERON) 15 MG tablet Take 15 mg by mouth at bedtime.     . Multiple Vitamin (MULTIVITAMIN) tablet Take 1 tablet by mouth daily.    . potassium chloride (K-DUR,KLOR-CON) 10 MEQ tablet Take 1 tablet (10 mEq total) by mouth daily. 90 tablet 3  . simvastatin (ZOCOR) 20 MG tablet Take 1 tablet (20 mg total) by mouth at bedtime. 90 tablet 3  . traZODone (DESYREL) 100 MG tablet Take 1 tablet by mouth At bedtime.     No current facility-administered medications for this visit.    OBJECTIVE:  Filed Vitals:   06/17/15 1142  BP: 167/94  Pulse: 78  Temp: 96.4 F (35.8 C)     Body mass index is 24.02 kg/(m^2).    ECOG FS:0 - Asymptomatic  PHYSICAL EXAM: Goal status: Performance status is good.  Patient has not lost significant weight HEENT: No evidence of stomatitis. Sclera and conjunctivae :: No jaundice.   pale looking. Lungs: Air  entry equal on both sides.  No rhonchi.  No rales.  Cardiac:  Heart sounds are normal.  No pericardial rub.  No murmur. Lymphatic system: Cervical, axillary, inguinal, lymph nodes not palpable GI: Abdomen is soft.  No ascites.  Liver spleen not palpable.  No tenderness.  Bowel sounds are within normal limit Lower extremity: No edema Neurological system: Higher functions, cranial nerves intact no evidence of peripheral neuropathy. Skin: No rash.  No ecchymosis.. Examination of the right chest wall: No evidence of recurrent disease Examination of the left breast within normal limit   LAB RESULTS:  CBC Latest Ref Rng 06/17/2015 02/11/2015  WBC 3.6 - 11.0 K/uL 5.3 4.7  Hemoglobin 12.0 - 16.0 g/dL 14.1 13.3  Hematocrit 35.0 - 47.0 % 41.4 39.4  Platelets 150 - 440 K/uL 254 254  Appointment on 06/17/2015  Component Date Value Ref Range Status  . WBC 06/17/2015 5.3  3.6 - 11.0 K/uL Final  . RBC 06/17/2015 4.24  3.80 - 5.20 MIL/uL Final  . Hemoglobin 06/17/2015 14.1  12.0 - 16.0 g/dL Final  . HCT 24/27/8746 41.4  35.0 - 47.0 % Final  . MCV 06/17/2015 97.6  80.0 - 100.0 fL Final  . MCH 06/17/2015 33.3  26.0 - 34.0 pg Final  . MCHC 06/17/2015 34.1  32.0 - 36.0 g/dL Final  . RDW 24/42/4582 13.3  11.5 - 14.5 % Final  . Platelets 06/17/2015 254  150 - 440 K/uL Final  . Neutrophils Relative % 06/17/2015 47   Final  . Neutro Abs 06/17/2015 2.5  1.4 - 6.5 K/uL Final  . Lymphocytes Relative 06/17/2015 31   Final  . Lymphs Abs 06/17/2015 1.6  1.0 - 3.6 K/uL Final  . Monocytes Relative 06/17/2015 8   Final  . Monocytes Absolute 06/17/2015 0.4  0.2 - 0.9 K/uL Final  . Eosinophils Relative 06/17/2015 13   Final  . Eosinophils Absolute 06/17/2015 0.7  0 - 0.7 K/uL Final  . Basophils Relative 06/17/2015 1   Final  . Basophils Absolute 06/17/2015 0.1  0 - 0.1 K/uL Final  . Sodium 06/17/2015 137  135 - 145 mmol/L Final  . Potassium 06/17/2015 3.6  3.5 - 5.1 mmol/L Final  . Chloride 06/17/2015 101  101 - 111 mmol/L Final  . CO2 06/17/2015 31  22 - 32 mmol/L  Final  . Glucose, Bld 06/17/2015 134* 65 - 99 mg/dL Final  . BUN 00/34/3494 22* 6 - 20 mg/dL Final  . Creatinine, Ser 06/17/2015 0.89  0.44 - 1.00 mg/dL Final  . Calcium 14/44/0735 9.4  8.9 - 10.3 mg/dL Final  . Total Protein 06/17/2015 7.8  6.5 - 8.1 g/dL Final  . Albumin 99/76/0805 4.5  3.5 - 5.0 g/dL Final  . AST 10/86/8145 28  15 - 41 U/L Final  . ALT 06/17/2015 25  14 - 54 U/L Final  . Alkaline Phosphatase 06/17/2015 61  38 - 126 U/L Final  . Total Bilirubin 06/17/2015 0.6  0.3 - 1.2 mg/dL Final  . GFR calc non Af Amer 06/17/2015 >60  >60 mL/min Final  . GFR calc Af Amer 06/17/2015 >60  >60 mL/min Final   Comment: (NOTE) The eGFR has been calculated using the CKD EPI equation. This calculation has not been validated in all clinical situations. eGFR's persistently <60 mL/min signify possible Chronic Kidney Disease.   . Anion gap 06/17/2015 5  5 - 15 Final  . CA 27.29 06/17/2015 24.1  0.0 - 38.6 U/mL Final   Comment: (NOTE) [employer] Cent[employer]/ACS methodology Performed At: [facility] [address] [place], [place] [id number] Mila Homer MD XB:[id number]       STUDIES: Last mammogram was in October of 2015 ASSESSMENT: There is no clinical evidence of recurrent disease.  We will continue letrozole and calcium and vitamin D.  Patient is tolerating very well.  Next mammogram is being scheduled in October of 2016  MEDICAL DECISION MAKING:  All lab data has been reviewed. No evidence of recurrent disease Continue letrozole calcium and vitamin D  Patient expressed understanding and was in agreement with this plan. She also understands that She can call clinic at any time with any questions, concerns, or complaints.    No matching staging information was found for the patient.  Johney Maine, MD   06/28/2015 8:46 PM

## 2015-08-18 ENCOUNTER — Telehealth: Payer: Self-pay | Admitting: *Deleted

## 2015-08-18 DIAGNOSIS — C50311 Malignant neoplasm of lower-inner quadrant of right female breast: Secondary | ICD-10-CM

## 2015-08-18 NOTE — Telephone Encounter (Signed)
Needs an order sent for a mammogram, states it is time for it to be done

## 2015-08-18 NOTE — Telephone Encounter (Signed)
Order diagnostic mammo per Dr Oliva Bustard. Order entered and message sent to scheduling

## 2015-08-19 ENCOUNTER — Other Ambulatory Visit: Payer: Self-pay | Admitting: *Deleted

## 2015-08-19 ENCOUNTER — Telehealth: Payer: Self-pay

## 2015-08-19 NOTE — Telephone Encounter (Signed)
I received a phone call from patient stating that her psychiatrist whom is retiring, prescribes her Clonazepam, and Mirtazapine. Patient states that was advised but her psychiatrist that she would need to contact PCP about further prescribing her medications. Patient states that she feels like she no longer needs a psychiatrist, but she will need refills on those medications soon, and was wondering if Dr. Glori Bickers would be willing to refill them?

## 2015-08-19 NOTE — Telephone Encounter (Signed)
If she is stable with no recent changes - that will probably be fine Please f/u for a visit to review /disc further  Klonopin is controlled - so she will likely need to sign a contract for controlled substance as well

## 2015-08-19 NOTE — Telephone Encounter (Signed)
Pt had a few months worth of meds so appt scheduled for 10/06/15 per pt request

## 2015-08-19 NOTE — Addendum Note (Signed)
Addended by: Betti Cruz on: 08/19/2015 08:52 AM   Modules accepted: Orders

## 2015-08-27 ENCOUNTER — Other Ambulatory Visit: Payer: Self-pay | Admitting: Oncology

## 2015-08-27 DIAGNOSIS — Z23 Encounter for immunization: Secondary | ICD-10-CM | POA: Diagnosis not present

## 2015-09-03 ENCOUNTER — Other Ambulatory Visit: Payer: Self-pay | Admitting: Oncology

## 2015-09-03 ENCOUNTER — Ambulatory Visit
Admission: RE | Admit: 2015-09-03 | Discharge: 2015-09-03 | Disposition: A | Discharge status: Home / Self Care | Payer: Medicare Other | Source: Ambulatory Visit | Attending: Oncology | Admitting: Oncology

## 2015-09-03 ENCOUNTER — Other Ambulatory Visit: Payer: Self-pay | Admitting: *Deleted

## 2015-09-03 DIAGNOSIS — C50311 Malignant neoplasm of lower-inner quadrant of right female breast: Secondary | ICD-10-CM

## 2015-09-03 DIAGNOSIS — Z1231 Encounter for screening mammogram for malignant neoplasm of breast: Secondary | ICD-10-CM

## 2015-09-03 DIAGNOSIS — Z1239 Encounter for other screening for malignant neoplasm of breast: Secondary | ICD-10-CM

## 2015-09-03 DIAGNOSIS — Z87898 Personal history of other specified conditions: Secondary | ICD-10-CM

## 2015-09-03 DIAGNOSIS — Z129 Encounter for screening for malignant neoplasm, site unspecified: Secondary | ICD-10-CM

## 2015-10-06 ENCOUNTER — Encounter: Payer: Self-pay | Admitting: Family Medicine

## 2015-10-06 ENCOUNTER — Ambulatory Visit (INDEPENDENT_AMBULATORY_CARE_PROVIDER_SITE_OTHER): Payer: Medicare Other | Admitting: Family Medicine

## 2015-10-06 VITALS — BP 124/68 | HR 81 | Temp 98.5°F | Ht 63.0 in | Wt 134.8 lb

## 2015-10-06 DIAGNOSIS — F411 Generalized anxiety disorder: Secondary | ICD-10-CM | POA: Diagnosis not present

## 2015-10-06 MED ORDER — MIRTAZAPINE 15 MG PO TABS: 15.0000 mg | ORAL_TABLET | Freq: Every day | ORAL | Status: DC

## 2015-10-06 NOTE — Assessment & Plan Note (Signed)
Taking over care from Dr Bridgett Larsson Pt is stable for over 2 y on mirtazapine and clonazepam after failing mult other meds in the past  Reviewed stressors/ coping techniques/symptoms/ support sources/ tx options and side effects in detail today Declines counseling  Stressors are imp  Refilled mirtazapine for 1 y  Will call when due for klonopin  She will need to sign cont subst agreement as well  Disc imp of exercise in anx management as well

## 2015-10-06 NOTE — Progress Notes (Signed)
Pre visit review using our clinic review tool, if applicable. No additional management support is needed unless otherwise documented below in the visit note. 

## 2015-10-06 NOTE — Progress Notes (Signed)
Subjective:    Patient ID: CAYDANCE WRITER, female    DOB: 1945-11-03, 69 y.o.   MRN: [id number]  HPI Here for f/u regarding change of psychiatric care/meds to myself   Her psychiatrist retired  Was seeing Dr Bridgett Larsson   Is doing fine and has been stable for the past 2 years (2013 is when she changed to psychiatry) Also lost her husband and she was dx breast cancer   In the past she failed zoloft/ effexor / buspar/ cymbalta   Dx with gen anxiety disorder (not depression)  Stable on current medicines  Klonopin 1 mg up to three times daily (she takes 2-3 pills depending on the day)   Mirtazapine - 15 mg at bedtime - helps with sleep  / does not feel it has affected appetite  She rarely takes trazadone -once in a blue moon for sleep (1/4 pill)   No counseling  Never saw a separate counselor -does not feel like she needs to  Biggest stress is her breast ca diagnosis  Grief - thinks she is doing fair - good days and bad days  Does not exercise - has time to do it  Would consider walking   Feels good now - better than she has felt in years   fam hx : sister has anxiety   No one with suicide in family    Patient Active Problem List   Diagnosis Date Noted  . Viral URI 12/31/2014  . Encounter for routine gynecological examination 12/30/2014  . Breast cancer ([place]) 09/24/2014  . Malignant neoplasm of right breast ([place]) 08/25/2014  . Encounter for Medicare annual wellness exam 12/27/2013  . Colon cancer screening 12/27/2013  . Abnormal EKG 01/17/2012  . Other screening mammogram 05/13/2011  . Hyperglycemia 05/13/2011  . Macrocytosis 05/13/2011  . Gynecological examination 05/13/2011  . Routine general medical examination at a health care facility 05/08/2011  . [place] DISEASE, LUMBAR 02/28/2008  . LIVEDO RETICULARIS 02/28/2008  . Hyperlipidemia 01/12/2007  . Generalized anxiety disorder 01/12/2007  . Essential hypertension 01/12/2007  . ALLERGIC RHINITIS 01/12/2007  . SLEEP  DISORDER 01/12/2007   Past Medical History  Diagnosis Date  . Allergic rhinitis   . Anxiety   . Hyperlipidemia 2003  . Hypertension   . Menopause   . Burning sensation     chronic,  skin  . History of ultrasound, pelvic 03/29/06    WNL  . Chronic cervicitis     with squamous metaplasia  . Breast cancer of lower-inner quadrant of right female breast [facility]) November 2015    T1c, N0/ ER + ; PR+; Her 2 neu not overexpressed. Oncotype DX: 14 % recurrence risk w/ antiestrogen alone (low intermediate group).   Past Surgical History  Procedure Laterality Date  . Dilation and curettage of uterus  1987    for DQB  . Colonoscopy  3/02    polyps  . Other surgical history      Neg renal ultrasound  . Breast surgery Right 09/04/14    Mastectomy   . Breast biopsy    . Mastectomy Right 08/2014    +   Social History  Substance Use Topics  . Smoking status: Never Smoker   . Smokeless tobacco: Never Used  . Alcohol Use: No   Family History  Problem Relation Age of Onset  . Diabetes Father   . Hypertension Father   . Heart failure Father   . Coronary artery disease Father   . Hypertension Mother   .  Colon cancer Neg Hx   . Colon polyps Neg Hx   . Pancreatic cancer Neg Hx   . Rectal cancer Neg Hx   . Stomach cancer Neg Hx   . Ulcerative colitis Neg Hx   . Esophageal cancer Neg Hx   . Crohn's disease Neg Hx    Allergies  Allergen Reactions  . Alendronate Sodium     FOSAMAX REACTION: body pain- severe all over   Current Outpatient Prescriptions on File Prior to Visit  Medication Sig Dispense Refill  . benazepril (LOTENSIN) 40 MG tablet Take 1 tablet (40 mg total) by mouth daily. 90 tablet 3  . Calcium Carbonate-Vitamin D 600-400 MG-UNIT per tablet Take 2 tablets by mouth daily.      . carvedilol (COREG) 25 MG tablet Take 1 tablet (25 mg total) by mouth 2 (two) times daily. 180 tablet 3  . clonazePAM (KLONOPIN) 1 MG tablet Take 1 mg by mouth 3 (three) times daily as needed.   4    . hydrochlorothiazide (HYDRODIURIL) 25 MG tablet Take 1 tablet (25 mg total) by mouth daily. 90 tablet 3  . letrozole (FEMARA) 2.5 MG tablet TAKE 1 TABLET (2.5 MG TOTAL) BY MOUTH DAILY. 30 tablet 11  . Multiple Vitamin (MULTIVITAMIN) tablet Take 1 tablet by mouth daily.    . potassium chloride (K-DUR,KLOR-CON) 10 MEQ tablet Take 1 tablet (10 mEq total) by mouth daily. 90 tablet 3  . simvastatin (ZOCOR) 20 MG tablet Take 1 tablet (20 mg total) by mouth at bedtime. 90 tablet 3   No current facility-administered medications on file prior to visit.    Review of Systems    Review of Systems  Constitutional: Negative for fever, appetite change, fatigue and unexpected weight change.  Eyes: Negative for pain and visual disturbance.  Respiratory: Negative for cough and shortness of breath.   Cardiovascular: Negative for cp or palpitations    Gastrointestinal: Negative for nausea, diarrhea and constipation.  Genitourinary: Negative for urgency and frequency.  Skin: Negative for pallor or rash   Neurological: Negative for weakness, light-headedness, numbness and headaches.  Hematological: Negative for adenopathy. Does not bruise/bleed easily.  Psychiatric/Behavioral: Negative for dysphoric mood. Pos for anxiety that is controlled, pos for grief that is improved       Objective:   Physical Exam  Constitutional: She appears well-developed and well-nourished. No distress.  Well appearing  HENT:  Head: Normocephalic and atraumatic.  Mouth/Throat: Oropharynx is clear and moist.  Eyes: Conjunctivae and EOM are normal. Pupils are equal, round, and reactive to light.  Neck: Normal range of motion. Neck supple. No JVD present. Carotid bruit is not present. No thyromegaly present.  Cardiovascular: Normal rate, regular rhythm, normal heart sounds and intact distal pulses.  Exam reveals no gallop.   Pulmonary/Chest: Effort normal and breath sounds normal. No respiratory distress. She has no wheezes. She  has no rales.  No crackles  Abdominal: Soft. Bowel sounds are normal. She exhibits no distension, no abdominal bruit and no mass. There is no tenderness.  Musculoskeletal: She exhibits no edema.  Lymphadenopathy:    She has no cervical adenopathy.  Neurological: She is alert. She has normal reflexes.  Skin: Skin is warm and dry. No rash noted.  Psychiatric: She has a normal mood and affect. Her speech is normal and behavior is normal. Thought content normal. Her mood appears not anxious. Her affect is not blunt and not labile. Thought content is not paranoid. Cognition and memory are normal. She does  not exhibit a depressed mood. She expresses no homicidal and no suicidal ideation.  Pleasant and talkative           Assessment & Plan:   Problem List Items Addressed This Visit      Other   Generalized anxiety disorder - Primary    Taking over care from Dr Bridgett Larsson Pt is stable for over 2 y on mirtazapine and clonazepam after failing mult other meds in the past  Reviewed stressors/ coping techniques/symptoms/ support sources/ tx options and side effects in detail today Declines counseling  Stressors are imp  Refilled mirtazapine for 1 y  Will call when due for klonopin  She will need to sign cont subst agreement as well  Disc imp of exercise in anx management as well

## 2015-10-06 NOTE — Patient Instructions (Signed)
Here is your mirtazapine px for when you need it  Call or have pharmacy call when you need the clonazepam  Take care of yourself  Avoid caffeine  Try to incorporate some exercise

## 2015-11-13 ENCOUNTER — Encounter: Payer: Self-pay | Admitting: Family Medicine

## 2015-11-13 ENCOUNTER — Ambulatory Visit (INDEPENDENT_AMBULATORY_CARE_PROVIDER_SITE_OTHER): Payer: Medicare Other | Admitting: Family Medicine

## 2015-11-13 VITALS — BP 116/62 | HR 87 | Temp 98.1°F | Wt 135.8 lb

## 2015-11-13 DIAGNOSIS — R05 Cough: Secondary | ICD-10-CM | POA: Insufficient documentation

## 2015-11-13 DIAGNOSIS — R058 Other specified cough: Secondary | ICD-10-CM | POA: Insufficient documentation

## 2015-11-13 MED ORDER — HYDROCODONE-HOMATROPINE 5-1.5 MG/5ML PO SYRP: 5.0000 mL | ORAL_SOLUTION | Freq: Three times a day (TID) | ORAL | Status: DC | PRN

## 2015-11-13 MED ORDER — BENZONATATE 200 MG PO CAPS: 200.0000 mg | ORAL_CAPSULE | Freq: Three times a day (TID) | ORAL | Status: DC | PRN

## 2015-11-13 NOTE — Progress Notes (Signed)
Subjective:    Patient ID: Shannon Stephens, female    DOB: 06/08/1946, 70 y.o.   MRN: [id number]  HPI Here with uri symptoms  Started about a week ago - had chills and fever and sweats and aches - that was the first 2 d Did not take her temp   Now hoarse Tickle in throat- making her cough (worse at night)  Cough is not too deep and not productive   Nose is stuffed up  Ears are fine  No facial pain   Taking mucinex and robitussin  Fever is down now   Patient Active Problem List   Diagnosis Date Noted  . Viral URI 12/31/2014  . Encounter for routine gynecological examination 12/30/2014  . Breast cancer ([place]) 09/24/2014  . Malignant neoplasm of right breast ([place]) 08/25/2014  . Encounter for Medicare annual wellness exam 12/27/2013  . Colon cancer screening 12/27/2013  . Abnormal EKG 01/17/2012  . Other screening mammogram 05/13/2011  . Hyperglycemia 05/13/2011  . Macrocytosis 05/13/2011  . Gynecological examination 05/13/2011  . Routine general medical examination at a health care facility 05/08/2011  . [place]ke DISEASE, LUMBAR 02/28/2008  . LIVEDO RETICULARIS 02/28/2008  . Hyperlipidemia 01/12/2007  . Generalized anxiety disorder 01/12/2007  . Essential hypertension 01/12/2007  . ALLERGIC RHINITIS 01/12/2007  . SLEEP DISORDER 01/12/2007   Past Medical History  Diagnosis Date  . Allergic rhinitis   . Anxiety   . Hyperlipidemia 2003  . Hypertension   . Menopause   . Burning sensation     chronic,  skin  . History of ultrasound, pelvic 03/29/06    WNL  . Chronic cervicitis     with squamous metaplasia  . Breast cancer of lower-inner quadrant of right female breast [facility]) November 2015    T1c, N0/ ER + ; PR+; Her 2 neu not overexpressed. Oncotype DX: 14 % recurrence risk w/ antiestrogen alone (low intermediate group).   Past Surgical History  Procedure Laterality Date  . Dilation and curettage of uterus  1987    for DQB  . Colonoscopy  3/02    polyps  . Other  surgical history      Neg renal ultrasound  . Breast surgery Right 09/04/14    Mastectomy   . Breast biopsy    . Mastectomy Right 08/2014    +   Social History  Substance Use Topics  . Smoking status: Never Smoker   . Smokeless tobacco: Never Used  . Alcohol Use: No   Family History  Problem Relation Age of Onset  . Diabetes Father   . Hypertension Father   . Heart failure Father   . Coronary artery disease Father   . Hypertension Mother   . Colon cancer Neg Hx   . Colon polyps Neg Hx   . Pancreatic cancer Neg Hx   . Rectal cancer Neg Hx   . Stomach cancer Neg Hx   . Ulcerative colitis Neg Hx   . Esophageal cancer Neg Hx   . Crohn's disease Neg Hx    Allergies  Allergen Reactions  . Alendronate Sodium     FOSAMAX REACTION: body pain- severe all over   Current Outpatient Prescriptions on File Prior to Visit  Medication Sig Dispense Refill  . benazepril (LOTENSIN) 40 MG tablet Take 1 tablet (40 mg total) by mouth daily. 90 tablet 3  . Calcium Carbonate-Vitamin D 600-400 MG-UNIT per tablet Take 2 tablets by mouth daily.      . carvedilol (COREG)  25 MG tablet Take 1 tablet (25 mg total) by mouth 2 (two) times daily. 180 tablet 3  . clonazePAM (KLONOPIN) 1 MG tablet Take 1 mg by mouth 3 (three) times daily as needed.   4  . hydrochlorothiazide (HYDRODIURIL) 25 MG tablet Take 1 tablet (25 mg total) by mouth daily. 90 tablet 3  . letrozole (FEMARA) 2.5 MG tablet TAKE 1 TABLET (2.5 MG TOTAL) BY MOUTH DAILY. 30 tablet 11  . mirtazapine (REMERON) 15 MG tablet Take 1 tablet (15 mg total) by mouth at bedtime. 30 tablet 11  . Multiple Vitamin (MULTIVITAMIN) tablet Take 1 tablet by mouth daily.    . potassium chloride (K-DUR,KLOR-CON) 10 MEQ tablet Take 1 tablet (10 mEq total) by mouth daily. 90 tablet 3  . simvastatin (ZOCOR) 20 MG tablet Take 1 tablet (20 mg total) by mouth at bedtime. 90 tablet 3   No current facility-administered medications on file prior to visit.     Review  of Systems Review of Systems  Constitutional: Negative for fever, appetite change, fatigue and unexpected weight change.  Eyes: Negative for pain and visual disturbance.  ENT pos for cong and rhinorrhea/neg for sinus pain  Respiratory: Negative for wheeze and shortness of breath.   Cardiovascular: Negative for cp or palpitations    Gastrointestinal: Negative for nausea, diarrhea and constipation.  Genitourinary: Negative for urgency and frequency.  Skin: Negative for pallor or rash   Neurological: Negative for weakness, light-headedness, numbness and headaches.  Hematological: Negative for adenopathy. Does not bruise/bleed easily.  Psychiatric/Behavioral: Negative for dysphoric mood. The patient is not nervous/anxious.         Objective:   Physical Exam  Constitutional: She appears well-developed and well-nourished. No distress.  Well appearing   HENT:  Head: Normocephalic and atraumatic.  Right Ear: External ear normal.  Left Ear: External ear normal.  Mouth/Throat: Oropharynx is clear and moist.  Nares are injected and congested  No sinus tenderness Clear rhinorrhea and post nasal drip   Eyes: Conjunctivae and EOM are normal. Pupils are equal, round, and reactive to light. Right eye exhibits no discharge. Left eye exhibits no discharge.  Neck: Normal range of motion. Neck supple.  Cardiovascular: Normal rate and normal heart sounds.   Pulmonary/Chest: Effort normal and breath sounds normal. No respiratory distress. She has no wheezes. She has no rales. She exhibits no tenderness.  Harsh dry cough  Lymphadenopathy:    She has no cervical adenopathy.  Neurological: She is alert.  Skin: Skin is warm and dry. No rash noted.  Psychiatric: She has a normal mood and affect.          Assessment & Plan:   Problem List Items Addressed This Visit      Other   Post-viral cough syndrome - Primary    S/p uri Improved but not resolved I think you have a post viral cough  syndrome  We need to stop the cough cycle Rest your voice Drink fluids and rest  Tessalon -three times daily  Hycodan- with caution at night or when not working or driving  If worse/ fever comes back or worse cough or facial pain - call and let us know   Update if not starting to improve in a week or if worsening

## 2015-11-13 NOTE — Patient Instructions (Signed)
I think you have a post viral cough syndrome  We need to stop the cough cycle Rest your voice Drink fluids and rest  Tessalon -three times daily  Hycodan- with caution at night or when not working or driving  If worse/ fever comes back or worse cough or facial pain - call and let us know   Update if not starting to improve in a week or if worsening

## 2015-11-13 NOTE — Progress Notes (Signed)
Pre visit review using our clinic review tool, if applicable. No additional management support is needed unless otherwise documented below in the visit note. 

## 2015-11-14 NOTE — Assessment & Plan Note (Signed)
S/p uri Improved but not resolved I think you have a post viral cough syndrome  We need to stop the cough cycle Rest your voice Drink fluids and rest  Tessalon -three times daily  Hycodan- with caution at night or when not working or driving  If worse/ fever comes back or worse cough or facial pain - call and let us know   Update if not starting to improve in a week or if worsening

## 2015-12-15 ENCOUNTER — Encounter: Payer: Self-pay | Admitting: *Deleted

## 2015-12-16 ENCOUNTER — Inpatient Hospital Stay: Payer: Medicare Other | Attending: Oncology

## 2015-12-16 ENCOUNTER — Inpatient Hospital Stay (HOSPITAL_BASED_OUTPATIENT_CLINIC_OR_DEPARTMENT_OTHER): Payer: Medicare Other | Admitting: Oncology

## 2015-12-16 VITALS — BP 165/94 | HR 73 | Temp 96.8°F | Resp 18 | Wt 136.2 lb

## 2015-12-16 DIAGNOSIS — Z79811 Long term (current) use of aromatase inhibitors: Secondary | ICD-10-CM

## 2015-12-16 DIAGNOSIS — C50311 Malignant neoplasm of lower-inner quadrant of right female breast: Secondary | ICD-10-CM | POA: Insufficient documentation

## 2015-12-16 DIAGNOSIS — Z9011 Acquired absence of right breast and nipple: Secondary | ICD-10-CM

## 2015-12-16 DIAGNOSIS — Z170 Estrogen receptor positive status [ER+]: Secondary | ICD-10-CM

## 2015-12-16 DIAGNOSIS — E785 Hyperlipidemia, unspecified: Secondary | ICD-10-CM | POA: Diagnosis not present

## 2015-12-16 DIAGNOSIS — Z79899 Other long term (current) drug therapy: Secondary | ICD-10-CM | POA: Diagnosis not present

## 2015-12-16 DIAGNOSIS — C50911 Malignant neoplasm of unspecified site of right female breast: Secondary | ICD-10-CM

## 2015-12-16 DIAGNOSIS — I10 Essential (primary) hypertension: Secondary | ICD-10-CM | POA: Diagnosis not present

## 2015-12-16 LAB — COMPREHENSIVE METABOLIC PANEL
ALK PHOS: 52 U/L (ref 38–126)
ALT: 23 U/L (ref 14–54)
ANION GAP: 6 (ref 5–15)
AST: 22 U/L (ref 15–41)
Albumin: 4.4 g/dL (ref 3.5–5.0)
BUN: 27 mg/dL — ABNORMAL HIGH (ref 6–20)
CALCIUM: 9.9 mg/dL (ref 8.9–10.3)
CHLORIDE: 102 mmol/L (ref 101–111)
CO2: 28 mmol/L (ref 22–32)
CREATININE: 0.85 mg/dL (ref 0.44–1.00)
Glucose, Bld: 119 mg/dL — ABNORMAL HIGH (ref 65–99)
Potassium: 3.7 mmol/L (ref 3.5–5.1)
SODIUM: 136 mmol/L (ref 135–145)
Total Bilirubin: 0.6 mg/dL (ref 0.3–1.2)
Total Protein: 7.6 g/dL (ref 6.5–8.1)

## 2015-12-16 LAB — CBC WITH DIFFERENTIAL/PLATELET
Basophils Absolute: 0.1 10*3/uL (ref 0–0.1)
Basophils Relative: 1 %
EOS ABS: 0.2 10*3/uL (ref 0–0.7)
EOS PCT: 4 %
HCT: 40.4 % (ref 35.0–47.0)
Hemoglobin: 13.9 g/dL (ref 12.0–16.0)
LYMPHS ABS: 1.7 10*3/uL (ref 1.0–3.6)
LYMPHS PCT: 32 %
MCH: 34 pg (ref 26.0–34.0)
MCHC: 34.4 g/dL (ref 32.0–36.0)
MCV: 98.9 fL (ref 80.0–100.0)
MONOS PCT: 7 %
Monocytes Absolute: 0.4 10*3/uL (ref 0.2–0.9)
Neutro Abs: 3 10*3/uL (ref 1.4–6.5)
Neutrophils Relative %: 56 %
PLATELETS: 224 10*3/uL (ref 150–440)
RBC: 4.09 MIL/uL (ref 3.80–5.20)
RDW: 13.1 % (ref 11.5–14.5)
WBC: 5.5 10*3/uL (ref 3.6–11.0)

## 2015-12-16 NOTE — Progress Notes (Signed)
**Note Shannon-Identified via Obfuscation** Shannon Stephens  Telephone:([phone]) [phone]  Fax:([phone]) [phone]     Shannon Stephens DOB: 1946/08/30  MR#: [id number]  EQA#:[id number]  Patient Care Team: Abner Greenspan, MD as PCP - General Robert Bellow, MD (General Surgery) Abner Greenspan, MD as Consulting Physician (Family Medicine)  CHIEF COMPLAINT:  Chief Complaint  Patient presents with  . Breast Cancer    INTERVAL HISTORY:  Patient is here for further follow-up regarding carcinoma of the right breast. She is status post mastectomy from November 2015 for a T1 cN0 M0 tumor, ER/PR positive HER-2/neu negative. Patient is currently on letrozole, calcium, and vitamin D and tolerating very well. Patient does report some fatigue especially in the mornings. She states that it is difficult for her to get up and get moving. She is also had some lack in appetite but has not lost any significant weight. She overall reports feeling very well other than the complaint of fatigue. Her last mammogram was performed in October 2016 and reported as negative.  REVIEW OF SYSTEMS:   Review of Systems  Constitutional: Positive for malaise/fatigue. Negative for fever, chills, weight loss and diaphoresis.  HENT: Negative.   Eyes: Negative.   Respiratory: Negative for cough, hemoptysis, sputum production, shortness of breath and wheezing.   Cardiovascular: Negative for chest pain, palpitations, orthopnea, claudication, leg swelling and PND.  Gastrointestinal: Negative for heartburn, nausea, vomiting, abdominal pain, diarrhea, constipation, blood in stool and melena.  Genitourinary: Negative.   Musculoskeletal: Negative.   Skin: Negative.   Neurological: Negative for dizziness, tingling, focal weakness, seizures and weakness.  Endo/Heme/Allergies: Does not bruise/bleed easily.  Psychiatric/Behavioral: Negative for depression. The patient is not nervous/anxious and does not have insomnia.     As per HPI. Otherwise, a complete review of systems  is negatve.  ONCOLOGY HISTORY: Oncology History   Carcinoma of breast at 4:00 position at the right breast status post simple mastectomy and anterior lymph node evaluation  Right simple mastectomy done on September 04, 2014 T1c N0 M0 tumor. estrogen receptor positive progesterone receptor positive HER-2 receptor not overexpressed.  Oncotype DX score 22. 10 year distant recurrence rate 14% with tamoxifen alone.  Patient started on letrozole, 2015.     Malignant neoplasm of right breast ([place])   08/25/2014 Initial Diagnosis Malignant neoplasm of right breast [facility])    PAST MEDICAL HISTORY: Past Medical History  Diagnosis Date  . Allergic rhinitis   . Anxiety   . Hyperlipidemia 2003  . Hypertension   . Menopause   . Burning sensation     chronic,  skin  . History of ultrasound, pelvic 03/29/06    WNL  . Chronic cervicitis     with squamous metaplasia  . Breast cancer of lower-inner quadrant of right female breast [facility]) November 2015    T1c, N0/ ER + ; PR+; Her 2 neu not overexpressed. Oncotype DX: 14 % recurrence risk w/ antiestrogen alone (low intermediate group).    PAST SURGICAL HISTORY: Past Surgical History  Procedure Laterality Date  . Dilation and curettage of uterus  1987    for DQB  . Colonoscopy  3/02    polyps  . Other surgical history      Neg renal ultrasound  . Breast surgery Right 09/04/14    Mastectomy   . Breast biopsy    . Mastectomy Right 08/2014    +    FAMILY HISTORY Family History  Problem Relation Age of Onset  . Diabetes Father   .  Hypertension Father   . Heart failure Father   . Coronary artery disease Father   . Hypertension Mother   . Colon cancer Neg Hx   . Colon polyps Neg Hx   . Pancreatic cancer Neg Hx   . Rectal cancer Neg Hx   . Stomach cancer Neg Hx   . Ulcerative colitis Neg Hx   . Esophageal cancer Neg Hx   . Crohn's disease Neg Hx     GYNECOLOGIC HISTORY:  No LMP recorded. Patient is postmenopausal.     ADVANCED  DIRECTIVES:    HEALTH MAINTENANCE: Social History  Substance Use Topics  . Smoking status: Never Smoker   . Smokeless tobacco: Never Used  . Alcohol Use: No     Colonoscopy:  PAP:  Bone density:  Mammogram:  Allergies  Allergen Reactions  . Alendronate Sodium     FOSAMAX REACTION: body pain- severe all over    Current Outpatient Prescriptions  Medication Sig Dispense Refill  . benazepril (LOTENSIN) 40 MG tablet Take 1 tablet (40 mg total) by mouth daily. 90 tablet 3  . benzonatate (TESSALON) 200 MG capsule Take 1 capsule (200 mg total) by mouth 3 (three) times daily as needed for cough (swallow pill whole-do not bite pil). 30 capsule 1  . Calcium Carbonate-Vitamin D 600-400 MG-UNIT per tablet Take 2 tablets by mouth daily.      . carvedilol (COREG) 25 MG tablet Take 1 tablet (25 mg total) by mouth 2 (two) times daily. 180 tablet 3  . clonazePAM (KLONOPIN) 1 MG tablet Take 1 mg by mouth 3 (three) times daily as needed.   4  . hydrochlorothiazide (HYDRODIURIL) 25 MG tablet Take 1 tablet (25 mg total) by mouth daily. 90 tablet 3  . HYDROcodone-homatropine (HYCODAN) 5-1.5 MG/5ML syrup Take 5 mLs by mouth every 8 (eight) hours as needed for cough (when not working or driving - caution of sedation). 120 mL 0  . letrozole (FEMARA) 2.5 MG tablet TAKE 1 TABLET (2.5 MG TOTAL) BY MOUTH DAILY. 30 tablet 11  . mirtazapine (REMERON) 15 MG tablet Take 1 tablet (15 mg total) by mouth at bedtime. 30 tablet 11  . Multiple Vitamin (MULTIVITAMIN) tablet Take 1 tablet by mouth daily.    . potassium chloride (K-DUR,KLOR-CON) 10 MEQ tablet Take 1 tablet (10 mEq total) by mouth daily. 90 tablet 3  . simvastatin (ZOCOR) 20 MG tablet Take 1 tablet (20 mg total) by mouth at bedtime. 90 tablet 3   No current facility-administered medications for this visit.    OBJECTIVE: BP 165/94 mmHg  Pulse 73  Temp(Src) 96.8 F (36 C) (Tympanic)  Resp 18  Wt 136 lb 3.9 oz (61.8 kg)   Body mass index is 24.14  kg/(m^2).    ECOG FS:1 - Symptomatic but completely ambulatory  General: Well-developed, well-nourished, no acute distress. Eyes: Pink conjunctiva, anicteric sclera. HEENT: Normocephalic, moist mucous membranes, clear oropharnyx. Lungs: Clear to auscultation bilaterally. Heart: Regular rate and rhythm. No rubs, murmurs, or gallops. Abdomen: Soft, nontender, nondistended. No organomegaly noted, normoactive bowel sounds. Breast: Right mastectomy, chest wall free of masses. Left breast palpated in a circular manner in the sitting and supine positions.  No masses or fullness palpated.  Axilla palpated in both positions with no masses or fullness palpated.  Musculoskeletal: No edema, cyanosis, or clubbing. Neuro: Alert, answering all questions appropriately. Cranial nerves grossly intact. Skin: No rashes or petechiae noted. Psych: Normal affect. Lymphatics: No cervical, clavicular, or axillary LAD.   LAB  RESULTS:  Appointment on 12/16/2015  Component Date Value Ref Range Status  . WBC 12/16/2015 5.5  3.6 - 11.0 K/uL Final  . RBC 12/16/2015 4.09  3.80 - 5.20 MIL/uL Final  . Hemoglobin 12/16/2015 13.9  12.0 - 16.0 g/dL Final  . HCT 12/16/2015 40.4  35.0 - 47.0 % Final  . MCV 12/16/2015 98.9  80.0 - 100.0 fL Final  . MCH 12/16/2015 34.0  26.0 - 34.0 pg Final  . MCHC 12/16/2015 34.4  32.0 - 36.0 g/dL Final  . RDW 12/16/2015 13.1  11.5 - 14.5 % Final  . Platelets 12/16/2015 224  150 - 440 K/uL Final  . Neutrophils Relative % 12/16/2015 56   Final  . Neutro Abs 12/16/2015 3.0  1.4 - 6.5 K/uL Final  . Lymphocytes Relative 12/16/2015 32   Final  . Lymphs Abs 12/16/2015 1.7  1.0 - 3.6 K/uL Final  . Monocytes Relative 12/16/2015 7   Final  . Monocytes Absolute 12/16/2015 0.4  0.2 - 0.9 K/uL Final  . Eosinophils Relative 12/16/2015 4   Final  . Eosinophils Absolute 12/16/2015 0.2  0 - 0.7 K/uL Final  . Basophils Relative 12/16/2015 1   Final  . Basophils Absolute 12/16/2015 0.1  0 - 0.1 K/uL  Final  . Sodium 12/16/2015 136  135 - 145 mmol/L Final  . Potassium 12/16/2015 3.7  3.5 - 5.1 mmol/L Final  . Chloride 12/16/2015 102  101 - 111 mmol/L Final  . CO2 12/16/2015 28  22 - 32 mmol/L Final  . Glucose, Bld 12/16/2015 119* 65 - 99 mg/dL Final  . BUN 12/16/2015 27* 6 - 20 mg/dL Final  . Creatinine, Ser 12/16/2015 0.85  0.44 - 1.00 mg/dL Final  . Calcium 12/16/2015 9.9  8.9 - 10.3 mg/dL Final  . Total Protein 12/16/2015 7.6  6.5 - 8.1 g/dL Final  . Albumin 12/16/2015 4.4  3.5 - 5.0 g/dL Final  . AST 12/16/2015 22  15 - 41 U/L Final  . ALT 12/16/2015 23  14 - 54 U/L Final  . Alkaline Phosphatase 12/16/2015 52  38 - 126 U/L Final  . Total Bilirubin 12/16/2015 0.6  0.3 - 1.2 mg/dL Final  . GFR calc non Af Amer 12/16/2015 >60  >60 mL/min Final  . GFR calc Af Amer 12/16/2015 >60  >60 mL/min Final   Comment: (NOTE) The eGFR has been calculated using the CKD EPI equation. This calculation has not been validated in all clinical situations. eGFR's persistently <60 mL/min signify possible Chronic Kidney Disease.   . Anion gap 12/16/2015 6  5 - 15 Final    STUDIES: No results found.  ASSESSMENT:  Carcinoma of the right breast, stage I C, T1 cN0 M0.  PLAN:   1. Carcinoma of right breast. Patient is status post right mastectomy. Clinically there is no evidence of recurrent or progressive disease. Her most recent mammogram was in October 2016 and reported as negative. She is currently on letrozole, calcium, and vitamin D and tolerating very well. Advised patient on ways to improve fatigue including dietary changes, exercise, and increasing social activities with friends.  We'll continue with routine follow-up in 6 months.  Patient expressed understanding and was in agreement with this plan. She also understands that She can call clinic at any time with any questions, concerns, or complaints.   Dr. Oliva Bustard was available for consultation and review of plan of care for this  patient.  Stage IC carcinoma of right breast   Evlyn Kanner,  NP   12/16/2015 12:06 PM

## 2015-12-16 NOTE — Progress Notes (Signed)
Patient's BP slightly elevated today.  States she just took her meds prior to coming in.  Also states right breast area is tender to touch.

## 2016-01-19 ENCOUNTER — Other Ambulatory Visit: Payer: Self-pay | Admitting: Family Medicine

## 2016-01-19 NOTE — Telephone Encounter (Signed)
I am taking this over for Dr Bridgett Larsson - and we had a visit about it -this is the first time I will refill it -thanks for checking Px written for call in

## 2016-01-19 NOTE — Telephone Encounter (Signed)
Medication phoned to pharmacy.  

## 2016-01-19 NOTE — Telephone Encounter (Signed)
Electronic refill request, I can't tell if you have ever refilled med before (I don't think you have), please advise

## 2016-02-06 ENCOUNTER — Telehealth: Payer: Self-pay | Admitting: Family Medicine

## 2016-02-06 NOTE — Telephone Encounter (Signed)
LM for pt to sch AWV, mn ° °

## 2016-03-23 ENCOUNTER — Ambulatory Visit (INDEPENDENT_AMBULATORY_CARE_PROVIDER_SITE_OTHER): Payer: Medicare Other | Admitting: General Surgery

## 2016-03-23 ENCOUNTER — Encounter: Payer: Self-pay | Admitting: General Surgery

## 2016-03-23 VITALS — BP 140/82 | HR 78 | Resp 12 | Ht 63.0 in | Wt 136.0 lb

## 2016-03-23 DIAGNOSIS — C50911 Malignant neoplasm of unspecified site of right female breast: Secondary | ICD-10-CM

## 2016-03-23 NOTE — Progress Notes (Signed)
Patient ID: Shannon Stephens, female   DOB: 1946/09/17, 70 y.o.   MRN: [id number]  Chief Complaint  Patient presents with  . Follow-up    HPI Shannon Stephens is a 70 y.o. female.  Here today for follow up right breast cancer. She states she is doing well. No pain only tenderness along the chest wall. The patient reports that she is tolerating Femara without significant side effects, but does attribute some mild tiredness to the ongoing medication.  Left mammogram was 09-03-15.  I personally reviewed the medical history.  HPI  Past Medical History  Diagnosis Date  . Allergic rhinitis   . Anxiety   . Hyperlipidemia 2003  . Hypertension   . Menopause   . Burning sensation     chronic,  skin  . History of ultrasound, pelvic 03/29/06    WNL  . Chronic cervicitis     with squamous metaplasia  . Breast cancer of lower-inner quadrant of right female breast [facility]) November 2015    T1c, N0/ ER + ; PR+; Her 2 neu not overexpressed. Oncotype DX: 14 % recurrence risk w/ antiestrogen alone (low intermediate group).    Past Surgical History  Procedure Laterality Date  . Dilation and curettage of uterus  1987    for DQB  . Colonoscopy  3/02    polyps  . Other surgical history      Neg renal ultrasound  . Breast surgery Right 09/04/14    Mastectomy   . Breast biopsy    . Mastectomy Right 08/2014    +  . Colonoscopy  05-26-15    Dr Deatra Ina, 3 mm tubular adenoma in the sigmoid. Repeat exam 2021.    Family History  Problem Relation Age of Onset  . Diabetes Father   . Hypertension Father   . Heart failure Father   . Coronary artery disease Father   . Hypertension Mother   . Colon cancer Neg Hx   . Colon polyps Neg Hx   . Pancreatic cancer Neg Hx   . Rectal cancer Neg Hx   . Stomach cancer Neg Hx   . Ulcerative colitis Neg Hx   . Esophageal cancer Neg Hx   . Crohn's disease Neg Hx     Social History Social History  Substance Use Topics  . Smoking status: Never Smoker   . Smokeless  tobacco: Never Used  . Alcohol Use: No    Allergies  Allergen Reactions  . Alendronate Sodium     FOSAMAX REACTION: body pain- severe all over    Current Outpatient Prescriptions  Medication Sig Dispense Refill  . B Complex Vitamins (VITAMIN B COMPLEX PO) Take by mouth daily.    . benazepril (LOTENSIN) 40 MG tablet Take 1 tablet (40 mg total) by mouth daily. 90 tablet 3  . Calcium Carbonate-Vitamin D 600-400 MG-UNIT per tablet Take 2 tablets by mouth daily.      . carvedilol (COREG) 25 MG tablet Take 1 tablet (25 mg total) by mouth 2 (two) times daily. 180 tablet 3  . clonazePAM (KLONOPIN) 1 MG tablet TAKE 1 TABLET 3 TIMES A DAY FOR ANXIETY 90 tablet 3  . hydrochlorothiazide (HYDRODIURIL) 25 MG tablet Take 1 tablet (25 mg total) by mouth daily. 90 tablet 3  . letrozole (FEMARA) 2.5 MG tablet TAKE 1 TABLET (2.5 MG TOTAL) BY MOUTH DAILY. 30 tablet 11  . mirtazapine (REMERON) 15 MG tablet Take 1 tablet (15 mg total) by mouth at bedtime. 30 tablet 11  .  Multiple Vitamin (MULTIVITAMIN) tablet Take 1 tablet by mouth daily.    . potassium chloride (K-DUR,KLOR-CON) 10 MEQ tablet Take 1 tablet (10 mEq total) by mouth daily. 90 tablet 3  . simvastatin (ZOCOR) 20 MG tablet Take 1 tablet (20 mg total) by mouth at bedtime. 90 tablet 3   No current facility-administered medications for this visit.    Review of Systems Review of Systems  Constitutional: Negative.   Respiratory: Negative.   Cardiovascular: Negative.     Blood pressure 140/82, pulse 78, resp. rate 12, height 5\' 3"  (1.6 m), weight 136 lb (61.689 kg).  Physical Exam Physical Exam  Constitutional: She is oriented to person, place, and time. She appears well-developed and well-nourished.  HENT:  Mouth/Throat: Oropharynx is clear and moist.  Eyes: Conjunctivae are normal. No scleral icterus.  Neck: Neck supple.  Cardiovascular: Normal rate, regular rhythm and normal heart sounds.   Pulmonary/Chest: Effort normal and breath  sounds normal. Left breast exhibits no inverted nipple, no mass, no nipple discharge, no skin change and no tenderness.    Right mastectomy site well healed.  Lymphadenopathy:    She has no cervical adenopathy.    She has no axillary adenopathy.  Neurological: She is alert and oriented to person, place, and time.  Skin: Skin is warm and dry.  Psychiatric: Her behavior is normal.    Data Reviewed Left breast screening mammogram dated 09/03/2015 was reviewed. BIRAD-1.  Assessment    Benign breast exam. No evidence of recurrent disease status post right mastectomy.    Plan    Mammogram will be arranged in the fall by her treating oncologist. [facility] plan for a clinical exam in one year.     Follow up in one year  PCP:  [place] [place] A This information has been scribed by Karie Fetch RN, BSN,BC.    Robert Bellow 03/24/2016, 1:21 PM

## 2016-03-23 NOTE — Patient Instructions (Signed)
The patient is aware to call back for any questions or concerns.  

## 2016-03-24 ENCOUNTER — Encounter: Payer: Self-pay | Admitting: General Surgery

## 2016-04-20 ENCOUNTER — Other Ambulatory Visit: Payer: Self-pay | Admitting: Family Medicine

## 2016-06-15 ENCOUNTER — Other Ambulatory Visit: Payer: PRIVATE HEALTH INSURANCE

## 2016-06-15 ENCOUNTER — Ambulatory Visit: Payer: PRIVATE HEALTH INSURANCE | Admitting: Oncology

## 2016-07-09 ENCOUNTER — Telehealth: Payer: Self-pay | Admitting: Family Medicine

## 2016-07-09 DIAGNOSIS — E785 Hyperlipidemia, unspecified: Secondary | ICD-10-CM

## 2016-07-09 DIAGNOSIS — R739 Hyperglycemia, unspecified: Secondary | ICD-10-CM

## 2016-07-09 DIAGNOSIS — I10 Essential (primary) hypertension: Secondary | ICD-10-CM

## 2016-07-09 NOTE — Telephone Encounter (Signed)
-----   Message from Marchia Bond sent at 07/08/2016  1:48 PM EDT ----- Regarding: Cpx labs Thurs 9/14, need orders. Thanks :-) Please order  future cpx labs for pt's upcoming lab appt. Thanks Aniceto Boss

## 2016-07-12 ENCOUNTER — Other Ambulatory Visit: Payer: Self-pay | Admitting: *Deleted

## 2016-07-12 DIAGNOSIS — C50911 Malignant neoplasm of unspecified site of right female breast: Secondary | ICD-10-CM

## 2016-07-12 NOTE — Progress Notes (Signed)
[place]  [phone]) [phone]  [phone]) [phone]     Shannon Stephens DOB: [birth year]  MR#: [id number]  HER#:[id number]  Patient Care Team: Abner Greenspan, MD as PCP - General Robert Bellow, MD (General Surgery) Abner Greenspan, MD as Consulting Physician [facility])  CHIEF COMPLAINT: Pathologic stage Ia adenocarcinoma of the lower inner quadrant of the right breast.  INTERVAL HISTORY:   Patient returns to clinic today for routine six-month follow-up. She continues to tolerate letrozole well without significant side effects. She does not complain of weakness or fatigue today. She has no neurologic complaints. She denies any recent fevers or illnesses. She has no chest pain or shortness of breath. She denies any nausea, vomiting, constipation, or diarrhea. She has no urinary complaints. Patient feels at her baseline and offers no specific complaints today.  REVIEW OF SYSTEMS:   Review of Systems  Constitutional: Negative.  Negative for fever, malaise/fatigue and weight loss.  Respiratory: Negative.  Negative for cough and shortness of breath.   Cardiovascular: Negative.  Negative for chest pain and leg swelling.  Gastrointestinal: Negative.  Negative for abdominal pain.  Genitourinary: Negative.   Musculoskeletal: Negative.   Neurological: Negative.  Negative for sensory change and weakness.  Psychiatric/Behavioral: Negative.  The patient is not nervous/anxious.     As per HPI. Otherwise, a complete review of systems is negative.  ONCOLOGY HISTORY: Oncology History   Carcinoma of breast at 4:00 position at the right breast status post simple mastectomy and anterior lymph node evaluation  Right simple mastectomy done on September 04, 2014 T1c N0 M0 tumor. estrogen receptor positive progesterone receptor positive HER-2 receptor not overexpressed.  Oncotype DX score 22. 10 year distant recurrence rate 14% with tamoxifen alone.  Patient started on  letrozole, 2015.     Primary cancer of lower-inner quadrant of right female breast ([place])   08/25/2014 Initial Diagnosis    Malignant neoplasm of right breast [facility])       PAST MEDICAL HISTORY: Past Medical History:  Diagnosis Date  . Allergic rhinitis   . Anxiety   . Breast cancer of lower-inner quadrant of right female breast [facility]) November 2015   T1c, N0/ ER + ; PR+; Her 2 neu not overexpressed. Oncotype DX: 14 % recurrence risk w/ antiestrogen alone (low intermediate group).  . Burning sensation    chronic,  skin  . Chronic cervicitis    with squamous metaplasia  . History of ultrasound, pelvic 03/29/06   WNL  . Hyperlipidemia 2003  . Hypertension   . Menopause     PAST SURGICAL HISTORY: Past Surgical History:  Procedure Laterality Date  . BREAST BIOPSY    . BREAST SURGERY Right 09/04/14   Mastectomy   . COLONOSCOPY  3/02   polyps  . COLONOSCOPY  05-26-15   Dr Deatra Ina, 3 mm tubular adenoma in the sigmoid. Repeat exam 2021.  Marland Kitchen DILATION AND CURETTAGE OF UTERUS  1987   for DQB  . MASTECTOMY Right 08/2014   +  . OTHER SURGICAL HISTORY     Neg renal ultrasound    FAMILY HISTORY Family History  Problem Relation Age of Onset  . Diabetes Father   . Hypertension Father   . Heart failure Father   . Coronary artery disease Father   . Hypertension Mother   . Colon cancer Neg Hx   . Colon polyps Neg Hx   . Pancreatic cancer Neg Hx   . Rectal cancer Neg Hx   .  Stomach cancer Neg Hx   . Ulcerative colitis Neg Hx   . Esophageal cancer Neg Hx   . Crohn's disease Neg Hx     GYNECOLOGIC HISTORY:  No LMP recorded. Patient is postmenopausal.     ADVANCED DIRECTIVES:    HEALTH MAINTENANCE: Social History  Substance Use Topics  . Smoking status: Never Smoker  . Smokeless tobacco: Never Used  . Alcohol use No     Colonoscopy:  PAP:  Bone density:  Mammogram:  Allergies  Allergen Reactions  . Alendronate Sodium     FOSAMAX REACTION: body pain- severe all  over    Current Outpatient Prescriptions  Medication Sig Dispense Refill  . B Complex Vitamins (VITAMIN B COMPLEX PO) Take by mouth daily.    . benazepril (LOTENSIN) 40 MG tablet TAKE ONE TABLET BY MOUTH ONCE DAILY 90 tablet 0  . Calcium Carbonate-Vitamin D 600-400 MG-UNIT per tablet Take 2 tablets by mouth daily.      . carvedilol (COREG) 25 MG tablet TAKE ONE TABLET BY MOUTH TWICE DAILY 180 tablet 0  . clonazePAM (KLONOPIN) 1 MG tablet TAKE 1 TABLET 3 TIMES A DAY FOR ANXIETY 90 tablet 3  . hydrochlorothiazide (HYDRODIURIL) 25 MG tablet TAKE ONE TABLET BY MOUTH ONCE DAILY 90 tablet 0  . letrozole (FEMARA) 2.5 MG tablet TAKE 1 TABLET (2.5 MG TOTAL) BY MOUTH DAILY. 30 tablet 11  . mirtazapine (REMERON) 15 MG tablet Take 1 tablet (15 mg total) by mouth at bedtime. 30 tablet 11  . Multiple Vitamin (MULTIVITAMIN) tablet Take 1 tablet by mouth daily.    . potassium chloride (K-DUR,KLOR-CON) 10 MEQ tablet Take 1 tablet (10 mEq total) by mouth daily. 90 tablet 3  . simvastatin (ZOCOR) 20 MG tablet Take 1 tablet (20 mg total) by mouth at bedtime. 90 tablet 3   No current facility-administered medications for this visit.     OBJECTIVE: BP (!) 158/88 (BP Location: Left Arm, Patient Position: Sitting) Comment: Patient states BP is always high at the doctors office. She monitors her pressure at home.  Pulse 81   Temp 97.6 F (36.4 C) (Tympanic)   Ht _0  (1.575 m)   Wt 134 lb 13 oz (61.1 kg)   BMI 24.66 kg/m    Body mass index is 24.66 kg/m.    ECOG FS:0 - Asymptomatic  General: Well-developed, well-nourished, no acute distress. Eyes: Pink conjunctiva, anicteric sclera. Lungs: Clear to auscultation bilaterally. Heart: Regular rate and rhythm. No rubs, murmurs, or gallops. Abdomen: Soft, nontender, nondistended. No organomegaly noted, normoactive bowel sounds. Breast: Right chest wall without evidence of recurrence. Left breast and axilla without lumps or masses.    Musculoskeletal: No  edema, cyanosis, or clubbing. Neuro: Alert, answering all questions appropriately. Cranial nerves grossly intact. Skin: No rashes or petechiae noted. Psych: Normal affect.   LAB RESULTS:  Appointment on 07/13/2016  Component Date Value Ref Range Status  . WBC 07/13/2016 5.6  3.6 - 11.0 K/uL Final  . RBC 07/13/2016 4.05  3.80 - 5.20 MIL/uL Final  . Hemoglobin 07/13/2016 14.1  12.0 - 16.0 g/dL Final  . HCT 07/13/2016 40.2  35.0 - 47.0 % Final  . MCV 07/13/2016 99.4  80.0 - 100.0 fL Final  . MCH 07/13/2016 34.9* 26.0 - 34.0 pg Final  . MCHC 07/13/2016 35.1  32.0 - 36.0 g/dL Final  . RDW 07/13/2016 12.5  11.5 - 14.5 % Final  . Platelets 07/13/2016 285  150 - 440 K/uL Final  . Neutrophils  Relative % 07/13/2016 64  % Final  . Neutro Abs 07/13/2016 3.6  1.4 - 6.5 K/uL Final  . Lymphocytes Relative 07/13/2016 23  % Final  . Lymphs Abs 07/13/2016 1.3  1.0 - 3.6 K/uL Final  . Monocytes Relative 07/13/2016 9  % Final  . Monocytes Absolute 07/13/2016 0.5  0.2 - 0.9 K/uL Final  . Eosinophils Relative 07/13/2016 3  % Final  . Eosinophils Absolute 07/13/2016 0.2  0 - 0.7 K/uL Final  . Basophils Relative 07/13/2016 1  % Final  . Basophils Absolute 07/13/2016 0.1  0 - 0.1 K/uL Final  . Sodium 07/13/2016 138  135 - 145 mmol/L Final  . Potassium 07/13/2016 3.5  3.5 - 5.1 mmol/L Final  . Chloride 07/13/2016 103  101 - 111 mmol/L Final  . CO2 07/13/2016 29  22 - 32 mmol/L Final  . Glucose, Bld 07/13/2016 117* 65 - 99 mg/dL Final  . BUN 07/13/2016 26* 6 - 20 mg/dL Final  . Creatinine, Ser 07/13/2016 0.89  0.44 - 1.00 mg/dL Final  . Calcium 07/13/2016 10.0  8.9 - 10.3 mg/dL Final  . Total Protein 07/13/2016 7.9  6.5 - 8.1 g/dL Final  . Albumin 07/13/2016 4.3  3.5 - 5.0 g/dL Final  . AST 07/13/2016 32  15 - 41 U/L Final  . ALT 07/13/2016 38  14 - 54 U/L Final  . Alkaline Phosphatase 07/13/2016 52  38 - 126 U/L Final  . Total Bilirubin 07/13/2016 0.5  0.3 - 1.2 mg/dL Final  . GFR calc non Af Amer  07/13/2016 >60  >60 mL/min Final  . GFR calc Af Amer 07/13/2016 >60  >60 mL/min Final   Comment: (NOTE) The eGFR has been calculated using the CKD EPI equation. This calculation has not been validated in all clinical situations. eGFR's persistently <60 mL/min signify possible Chronic Kidney Disease.   . Anion gap 07/13/2016 6  5 - 15 Final    STUDIES: No results found.  ASSESSMENT: Pathologic stage Ia adenocarcinoma of the lower inner quadrant of the right breast.  PLAN:    1. Pathologic stage Ia adenocarcinoma of the lower inner quadrant of the right breast: Patient is status post right mastectomy in November 2015. Her Oncotype score of 22 was intermediate risk, but patient did not receive chemotherapy. Her most recent mammogram was in October 2016 reported BI-RADS 2. Repeat in October 2017. Continue letrozole completing 5 years of treatment in December 2020. Return to clinic in 6 months for routine evaluation. 2. Osteopenia: Patient's most recent bone mineral density on November 15, 2014 reported a T score of -1.6. Continue calcium and vitamin D. Repeat in January 2018.    Patient expressed understanding and was in agreement with this plan. She also understands that She can call clinic at any time with any questions, concerns, or complaints.     Lloyd Huger, MD   07/13/2016 3:28 PM

## 2016-07-13 ENCOUNTER — Inpatient Hospital Stay: Payer: Medicare Other | Attending: Oncology | Admitting: Oncology

## 2016-07-13 ENCOUNTER — Inpatient Hospital Stay: Payer: Medicare Other

## 2016-07-13 VITALS — BP 158/88 | HR 81 | Temp 97.6°F | Ht 62.0 in | Wt 134.8 lb

## 2016-07-13 DIAGNOSIS — Z170 Estrogen receptor positive status [ER+]: Secondary | ICD-10-CM | POA: Diagnosis not present

## 2016-07-13 DIAGNOSIS — Z79811 Long term (current) use of aromatase inhibitors: Secondary | ICD-10-CM | POA: Diagnosis not present

## 2016-07-13 DIAGNOSIS — E785 Hyperlipidemia, unspecified: Secondary | ICD-10-CM | POA: Insufficient documentation

## 2016-07-13 DIAGNOSIS — C50311 Malignant neoplasm of lower-inner quadrant of right female breast: Secondary | ICD-10-CM | POA: Insufficient documentation

## 2016-07-13 DIAGNOSIS — Z79899 Other long term (current) drug therapy: Secondary | ICD-10-CM | POA: Diagnosis not present

## 2016-07-13 DIAGNOSIS — C50911 Malignant neoplasm of unspecified site of right female breast: Secondary | ICD-10-CM

## 2016-07-13 DIAGNOSIS — Z780 Asymptomatic menopausal state: Secondary | ICD-10-CM | POA: Insufficient documentation

## 2016-07-13 DIAGNOSIS — M8580 Other specified disorders of bone density and structure, unspecified site: Secondary | ICD-10-CM

## 2016-07-13 DIAGNOSIS — Z1231 Encounter for screening mammogram for malignant neoplasm of breast: Secondary | ICD-10-CM

## 2016-07-13 DIAGNOSIS — I10 Essential (primary) hypertension: Secondary | ICD-10-CM | POA: Diagnosis not present

## 2016-07-13 DIAGNOSIS — F419 Anxiety disorder, unspecified: Secondary | ICD-10-CM | POA: Insufficient documentation

## 2016-07-13 DIAGNOSIS — Z9011 Acquired absence of right breast and nipple: Secondary | ICD-10-CM | POA: Diagnosis not present

## 2016-07-13 LAB — CBC WITH DIFFERENTIAL/PLATELET
Basophils Absolute: 0.1 10*3/uL (ref 0–0.1)
Basophils Relative: 1 %
Eosinophils Absolute: 0.2 10*3/uL (ref 0–0.7)
Eosinophils Relative: 3 %
HEMATOCRIT: 40.2 % (ref 35.0–47.0)
Hemoglobin: 14.1 g/dL (ref 12.0–16.0)
LYMPHS ABS: 1.3 10*3/uL (ref 1.0–3.6)
LYMPHS PCT: 23 %
MCH: 34.9 pg — AB (ref 26.0–34.0)
MCHC: 35.1 g/dL (ref 32.0–36.0)
MCV: 99.4 fL (ref 80.0–100.0)
MONO ABS: 0.5 10*3/uL (ref 0.2–0.9)
MONOS PCT: 9 %
NEUTROS ABS: 3.6 10*3/uL (ref 1.4–6.5)
Neutrophils Relative %: 64 %
Platelets: 285 10*3/uL (ref 150–440)
RBC: 4.05 MIL/uL (ref 3.80–5.20)
RDW: 12.5 % (ref 11.5–14.5)
WBC: 5.6 10*3/uL (ref 3.6–11.0)

## 2016-07-13 LAB — COMPREHENSIVE METABOLIC PANEL
ALK PHOS: 52 U/L (ref 38–126)
ALT: 38 U/L (ref 14–54)
AST: 32 U/L (ref 15–41)
Albumin: 4.3 g/dL (ref 3.5–5.0)
Anion gap: 6 (ref 5–15)
BUN: 26 mg/dL — ABNORMAL HIGH (ref 6–20)
CALCIUM: 10 mg/dL (ref 8.9–10.3)
CHLORIDE: 103 mmol/L (ref 101–111)
CO2: 29 mmol/L (ref 22–32)
CREATININE: 0.89 mg/dL (ref 0.44–1.00)
GFR calc Af Amer: >60 mL/min (ref >60)
GFR calc non Af Amer: >60 mL/min (ref >60)
GLUCOSE: 117 mg/dL — AB (ref 65–99)
Potassium: 3.5 mmol/L (ref 3.5–5.1)
Sodium: 138 mmol/L (ref 135–145)
Total Bilirubin: 0.5 mg/dL (ref 0.3–1.2)
Total Protein: 7.9 g/dL (ref 6.5–8.1)

## 2016-07-13 NOTE — Progress Notes (Signed)
Patient here breast cancer follow up. Patient has no concerns today.

## 2016-07-14 ENCOUNTER — Other Ambulatory Visit (INDEPENDENT_AMBULATORY_CARE_PROVIDER_SITE_OTHER): Payer: Medicare Other

## 2016-07-14 ENCOUNTER — Ambulatory Visit (INDEPENDENT_AMBULATORY_CARE_PROVIDER_SITE_OTHER): Payer: Medicare Other

## 2016-07-14 VITALS — BP 118/80 | HR 73 | Temp 98.6°F | Ht 62.5 in | Wt 134.2 lb

## 2016-07-14 DIAGNOSIS — Z0000 Encounter for general adult medical examination without abnormal findings: Secondary | ICD-10-CM | POA: Diagnosis not present

## 2016-07-14 DIAGNOSIS — R739 Hyperglycemia, unspecified: Secondary | ICD-10-CM | POA: Diagnosis not present

## 2016-07-14 DIAGNOSIS — I10 Essential (primary) hypertension: Secondary | ICD-10-CM | POA: Diagnosis not present

## 2016-07-14 DIAGNOSIS — E785 Hyperlipidemia, unspecified: Secondary | ICD-10-CM | POA: Diagnosis not present

## 2016-07-14 LAB — LIPID PANEL
Cholesterol: 211 mg/dL — ABNORMAL HIGH (ref 0–200)
HDL: 52.4 mg/dL (ref >39.00)
LDL CALC: 127 mg/dL — AB (ref 0–99)
NONHDL: 158.88
Total CHOL/HDL Ratio: 4
Triglycerides: 160 mg/dL — ABNORMAL HIGH (ref 0.0–149.0)
VLDL: 32 mg/dL (ref 0.0–40.0)

## 2016-07-14 LAB — COMPREHENSIVE METABOLIC PANEL
ALBUMIN: 4.4 g/dL (ref 3.5–5.2)
ALK PHOS: 58 U/L (ref 39–117)
ALT: 36 U/L — ABNORMAL HIGH (ref 0–35)
AST: 25 U/L (ref 0–37)
BUN: 23 mg/dL (ref 6–23)
CALCIUM: 10 mg/dL (ref 8.4–10.5)
CHLORIDE: 103 meq/L (ref 96–112)
CO2: 34 mEq/L — ABNORMAL HIGH (ref 19–32)
Creatinine, Ser: 0.91 mg/dL (ref 0.40–1.20)
GFR: 64.98 mL/min (ref >60.00)
Glucose, Bld: 122 mg/dL — ABNORMAL HIGH (ref 70–99)
POTASSIUM: 3.6 meq/L (ref 3.5–5.1)
Sodium: 141 mEq/L (ref 135–145)
Total Bilirubin: 0.3 mg/dL (ref 0.2–1.2)
Total Protein: 7.9 g/dL (ref 6.0–8.3)

## 2016-07-14 LAB — CBC WITH DIFFERENTIAL/PLATELET
BASOS PCT: 0.8 % (ref 0.0–3.0)
Basophils Absolute: 0 10*3/uL (ref 0.0–0.1)
EOS PCT: 3.7 % (ref 0.0–5.0)
Eosinophils Absolute: 0.2 10*3/uL (ref 0.0–0.7)
HEMATOCRIT: 41.4 % (ref 36.0–46.0)
Hemoglobin: 14.1 g/dL (ref 12.0–15.0)
LYMPHS PCT: 32.1 % (ref 12.0–46.0)
Lymphs Abs: 1.8 10*3/uL (ref 0.7–4.0)
MCHC: 34.1 g/dL (ref 30.0–36.0)
MCV: 100.5 fl — AB (ref 78.0–100.0)
Monocytes Absolute: 0.5 10*3/uL (ref 0.1–1.0)
Monocytes Relative: 8.1 % (ref 3.0–12.0)
NEUTROS PCT: 55.3 % (ref 43.0–77.0)
Neutro Abs: 3.2 10*3/uL (ref 1.4–7.7)
PLATELETS: 310 10*3/uL (ref 150.0–400.0)
RBC: 4.12 Mil/uL (ref 3.87–5.11)
RDW: 12.5 % (ref 11.5–15.5)
WBC: 5.7 10*3/uL (ref 4.0–10.5)

## 2016-07-14 LAB — HEMOGLOBIN A1C: Hgb A1c MFr Bld: 5.5 % (ref 4.6–6.5)

## 2016-07-14 LAB — TSH: TSH: 1.45 u[IU]/mL (ref 0.35–4.50)

## 2016-07-14 NOTE — Progress Notes (Signed)
Pre visit review using our clinic review tool, if applicable. No additional management support is needed unless otherwise documented below in the visit note. 

## 2016-07-14 NOTE — Patient Instructions (Signed)
Shannon Stephens , Thank you for taking time to come for your Medicare Wellness Visit. I appreciate your ongoing commitment to your health goals. Please review the following plan we discussed and let me know if I can assist you in the future.   These are the goals we discussed: Goals    . Increase water intake          Starting 07/14/2016, I will attempt to drink at least 6-8 glasses of water daily.        This is a list of the screening recommended for you and due dates:  Health Maintenance  Topic Date Due  . Flu Shot  10/30/2016*  .  Hepatitis C: One time screening is recommended by Center for Disease Control  (CDC) for  adults born from [birth year] through 1965.   07/14/2026*  . Mammogram  09/02/2017  . Colon Cancer Screening  05/25/2020  . Tetanus Vaccine  10/09/2022  . DEXA scan (bone density measurement)  Completed  . Shingles Vaccine  Addressed  . Pneumonia vaccines  Completed  *Topic was postponed. The date shown is not the original due date.   Preventive Care for Adults  A healthy lifestyle and preventive care can promote health and wellness. Preventive health guidelines for adults include the following key practices.  . A routine yearly physical is a good way to check with your health care provider about your health and preventive screening. It is a chance to share any concerns and updates on your health and to receive a thorough exam.  . Visit your dentist for a routine exam and preventive care every 6 months. Brush your teeth twice a day and floss once a day. Good oral hygiene prevents tooth decay and gum disease.  . The frequency of eye exams is based on your age, health, family medical history, use  of contact lenses, and other factors. Follow your health care provider's ecommendations for frequency of eye exams.  . Eat a healthy diet. Foods like vegetables, fruits, whole grains, low-fat dairy products, and lean protein foods contain the nutrients you need without too many  calories. Decrease your intake of foods high in solid fats, added sugars, and salt. Eat the right amount of calories for you. Get information about a proper diet from your health care provider, if necessary.  . Regular physical exercise is one of the most important things you can do for your health. Most adults should get at least 150 minutes of moderate-intensity exercise (any activity that increases your heart rate and causes you to sweat) each week. In addition, most adults need muscle-strengthening exercises on 2 or more days a week.  Silver Sneakers may be a benefit available to you. To determine eligibility, you may visit the website: www.silversneakers.com or contact program at 641-604-7617 Mon-Fri between 8AM-8PM.   . Maintain a healthy weight. The body mass index (BMI) is a screening tool to identify possible weight problems. It provides an estimate of body fat based on height and weight. Your health care provider can find your BMI and can help you achieve or maintain a healthy weight.   For adults 20 years and older: ? A BMI below 18.5 is considered underweight. ? A BMI of 18.5 to 24.9 is normal. ? A BMI of 25 to 29.9 is considered overweight. ? A BMI of 30 and above is considered obese.   . Maintain normal blood lipids and cholesterol levels by exercising and minimizing your intake of saturated fat. Eat a balanced  diet with plenty of fruit and vegetables. Blood tests for lipids and cholesterol should begin at age 57 and be repeated every 5 years. If your lipid or cholesterol levels are high, you are over 50, or you are at high risk for heart disease, you may need your cholesterol levels checked more frequently. Ongoing high lipid and cholesterol levels should be treated with medicines if diet and exercise are not working.  . If you smoke, find out from your health care provider how to quit. If you do not use tobacco, please do not start.  . If you choose to drink alcohol, please do  not consume more than 2 drinks per day. One drink is considered to be 12 ounces (355 mL) of beer, 5 ounces (148 mL) of wine, or 1.5 ounces (44 mL) of liquor.  . If you are 3-63 years old, ask your health care provider if you should take aspirin to prevent strokes.  . Use sunscreen. Apply sunscreen liberally and repeatedly throughout the day. You should seek shade when your shadow is shorter than you. Protect yourself by wearing long sleeves, pants, a wide-brimmed hat, and sunglasses year round, whenever you are outdoors.  . Once a month, do a whole body skin exam, using a mirror to look at the skin on your back. Tell your health care provider of new moles, moles that have irregular borders, moles that are larger than a pencil eraser, or moles that have changed in shape or color.

## 2016-07-14 NOTE — Progress Notes (Signed)
Subjective:   Shannon Stephens is a 70 y.o. female who presents for Medicare Annual (Subsequent) preventive examination.  Review of Systems:  N/A Cardiac Risk Factors include: advanced age (>74men, >79 women);sedentary lifestyle;dyslipidemia;hypertension     Objective:     Vitals: BP 118/80 (BP Location: Left Arm, Patient Position: Sitting, Cuff Size: Normal)   Pulse 73   Temp 98.6 F (37 C) (Oral)   Ht 5' 2.5" (1.588 m) Comment: no shoes  Wt 134 lb 4 oz (60.9 kg)   SpO2 95%   BMI 24.16 kg/m   Body mass index is 24.16 kg/m.   Tobacco History  Smoking Status  . Never Smoker  Smokeless Tobacco  . Never Used     Counseling given: No   Past Medical History:  Diagnosis Date  . Allergic rhinitis   . Anxiety   . Breast cancer of lower-inner quadrant of right female breast [facility]) November 2015   T1c, N0/ ER + ; PR+; Her 2 neu not overexpressed. Oncotype DX: 14 % recurrence risk w/ antiestrogen alone (low intermediate group).  . Burning sensation    chronic,  skin  . Chronic cervicitis    with squamous metaplasia  . History of ultrasound, pelvic 03/29/06   WNL  . Hyperlipidemia 2003  . Hypertension   . Menopause    Past Surgical History:  Procedure Laterality Date  . BREAST BIOPSY    . BREAST SURGERY Right 09/04/14   Mastectomy   . COLONOSCOPY  3/02   polyps  . COLONOSCOPY  05-26-15   Dr Deatra Ina, 3 mm tubular adenoma in the sigmoid. Repeat exam 2021.  Marland Kitchen DILATION AND CURETTAGE OF UTERUS  1987   for DQB  . MASTECTOMY Right 08/2014   +  . OTHER SURGICAL HISTORY     Neg renal ultrasound   Family History  Problem Relation Age of Onset  . Diabetes Father   . Hypertension Father   . Heart failure Father   . Coronary artery disease Father   . Hypertension Mother   . Colon cancer Neg Hx   . Colon polyps Neg Hx   . Pancreatic cancer Neg Hx   . Rectal cancer Neg Hx   . Stomach cancer Neg Hx   . Ulcerative colitis Neg Hx   . Esophageal cancer Neg Hx   . Crohn's  disease Neg Hx    History  Sexual Activity  . Sexual activity: Yes    Outpatient Encounter Prescriptions as of 07/14/2016  Medication Sig  . B Complex Vitamins (VITAMIN B COMPLEX PO) Take by mouth daily.  . benazepril (LOTENSIN) 40 MG tablet TAKE ONE TABLET BY MOUTH ONCE DAILY  . Calcium Carbonate-Vitamin D 600-400 MG-UNIT per tablet Take 2 tablets by mouth daily.    . carvedilol (COREG) 25 MG tablet TAKE ONE TABLET BY MOUTH TWICE DAILY  . clonazePAM (KLONOPIN) 1 MG tablet TAKE 1 TABLET 3 TIMES A DAY FOR ANXIETY  . hydrochlorothiazide (HYDRODIURIL) 25 MG tablet TAKE ONE TABLET BY MOUTH ONCE DAILY  . letrozole (FEMARA) 2.5 MG tablet TAKE 1 TABLET (2.5 MG TOTAL) BY MOUTH DAILY.  . mirtazapine (REMERON) 15 MG tablet Take 1 tablet (15 mg total) by mouth at bedtime.  . Multiple Vitamin (MULTIVITAMIN) tablet Take 1 tablet by mouth daily.  . potassium chloride (K-DUR,KLOR-CON) 10 MEQ tablet Take 1 tablet (10 mEq total) by mouth daily.  . simvastatin (ZOCOR) 20 MG tablet Take 1 tablet (20 mg total) by mouth at bedtime.  No facility-administered encounter medications on file as of 07/14/2016.     Activities of Daily Living In your present state of health, do you have any difficulty performing the following activities: 07/14/2016  Hearing? N  Vision? N  Difficulty concentrating or making decisions? N  Walking or climbing stairs? N  Dressing or bathing? N  Doing errands, shopping? N  Preparing Food and eating ? N  Using the Toilet? N  In the past six months, have you accidently leaked urine? N  Do you have problems with loss of bowel control? N  Managing your Medications? N  Managing your Finances? N  Housekeeping or managing your Housekeeping? N  Some recent data might be hidden    Patient Care Team: Abner Greenspan, MD as PCP - General Robert Bellow, MD (General Surgery) Abner Greenspan, MD as Consulting Physician (Family Medicine) Forest Gleason, MD as Consulting Physician  (Oncology)    Assessment:     Hearing Screening   125Hz  250Hz  500Hz  1000Hz  2000Hz  3000Hz  4000Hz  6000Hz  8000Hz   Right ear:   40 40 40  0    Left ear:   0 0 40  0      Visual Acuity Screening   Right eye Left eye Both eyes  Without correction:     With correction: 20/50 20/30 20/25-1    Exercise Activities and Dietary recommendations Current Exercise Habits: The patient does not participate in regular exercise at present, Exercise limited by: None identified  Goals    . Increase water intake          Starting 07/14/2016, I will attempt to drink at least 6-8 glasses of water daily.       Fall Risk Fall Risk  07/14/2016 12/30/2014 12/27/2013 10/09/2012  Falls in the past year? Yes No No No  Number falls in past yr: 1 - - -  Injury with Fall? No - - -  Follow up Falls evaluation completed - - -   Depression Screen PHQ 2/9 Scores 07/14/2016 12/30/2014 12/27/2013 10/09/2012  PHQ - 2 Score 0 0 0 0     Cognitive Testing MMSE - Mini Mental State Exam 07/14/2016  Orientation to time 5  Orientation to Place 5  Registration 3  Attention/ Calculation 0  Recall 3  Language- name 2 objects 0  Language- repeat 1  Language- follow 3 step command 3  Language- read & follow direction 0  Write a sentence 0  Copy design 0  Total score 20   PLEASE NOTE: A Mini-Cog screen was completed. Maximum score is 20. A value of 0 denotes this part of Folstein MMSE was not completed or the patient failed this part of the Mini-Cog screening.   Mini-Cog Screening Orientation to Time - Max 5 pts Orientation to Place - Max 5 pts Registration - Max 3 pts Recall - Max 3 pts Language Repeat - Max 1 pts Language Follow 3 Step Command - Max 3 pts  Immunization History  Administered Date(s) Administered  . Influenza Split 10/25/2012  . Influenza Whole 11/01/2003  . Influenza, High Dose Seasonal PF 09/28/2014, 08/27/2015  . Pneumococcal Conjugate-13 12/30/2014  . Pneumococcal Polysaccharide-23 12/27/2013   . Td 12/01/2002, 10/09/2012   Screening Tests Health Maintenance  Topic Date Due  . INFLUENZA VACCINE  10/30/2016 (Originally 05/31/2016)  . Hepatitis C Screening  07/14/2026 (Originally 1946/07/31)  . MAMMOGRAM  09/02/2017  . COLONOSCOPY  05/25/2020  . TETANUS/TDAP  10/09/2022  . DEXA SCAN  Completed  .  ZOSTAVAX  Addressed  . PNA vac Low Risk Adult  Completed      Plan:     I have personally reviewed and addressed the Medicare Annual Wellness questionnaire and have noted the following in the patient's chart:  A. Medical and social history B. Use of alcohol, tobacco or illicit drugs  C. Current medications and supplements D. Functional ability and status E.  Nutritional status F.  Physical activity G. Advance directives H. List of other physicians I.  Hospitalizations, surgeries, and ER visits in previous 12 months J.  [place] to include hearing, vision, cognitive, depression L. Referrals and appointments - none  In addition, I have reviewed and discussed with patient certain preventive protocols, quality metrics, and best practice recommendations. A written personalized care plan for preventive services as well as general preventive health recommendations were provided to patient.  See attached scanned questionnaire for additional information.   Signed,   Lindell Noe, MHA, BS, LPN Health Advisor

## 2016-07-14 NOTE — Progress Notes (Signed)
PCP notes:   Health maintenance:  Flu vaccine - pt will take vaccine at future date Hep C screening - pt declined  Abnormal screenings:   Hearing - failed Fall risk - hx of fall without injury  Patient concerns:   None  Nurse concerns:  None  Next PCP appt:   07/20/16 @ 1430

## 2016-07-19 NOTE — Progress Notes (Signed)
I reviewed health advisor's note, was available for consultation, and agree with documentation and plan.  

## 2016-07-20 ENCOUNTER — Ambulatory Visit (INDEPENDENT_AMBULATORY_CARE_PROVIDER_SITE_OTHER): Payer: Medicare Other | Admitting: Family Medicine

## 2016-07-20 ENCOUNTER — Encounter: Payer: Self-pay | Admitting: Family Medicine

## 2016-07-20 VITALS — BP 136/84 | HR 79 | Temp 98.4°F | Ht 62.5 in | Wt 132.5 lb

## 2016-07-20 DIAGNOSIS — R739 Hyperglycemia, unspecified: Secondary | ICD-10-CM

## 2016-07-20 DIAGNOSIS — Z23 Encounter for immunization: Secondary | ICD-10-CM

## 2016-07-20 DIAGNOSIS — I10 Essential (primary) hypertension: Secondary | ICD-10-CM | POA: Diagnosis not present

## 2016-07-20 DIAGNOSIS — E785 Hyperlipidemia, unspecified: Secondary | ICD-10-CM | POA: Diagnosis not present

## 2016-07-20 NOTE — Assessment & Plan Note (Signed)
Lab Results  Component Value Date   HGBA1C 5.5 07/14/2016   This is very well controlled with diet and good wt  Enc her to keep it up

## 2016-07-20 NOTE — Assessment & Plan Note (Signed)
bp in fair control at this time  BP Readings from Last 1 Encounters:  07/20/16 136/84   No changes needed Disc lifstyle change with low sodium diet and exercise  Labs reviewed  bp is generally lower at home Enc an exercise program

## 2016-07-20 NOTE — Patient Instructions (Signed)
Consider starting a regular exercise program  Labs are stable  Stay off the simvastatin for now  Flu shot today

## 2016-07-20 NOTE — Assessment & Plan Note (Signed)
Disc goals for lipids and reasons to control them Rev labs with pt Rev low sat fat diet in detail  Off simvastatin now since it caused muscle pain  Will continue to watch  LDL remains below 130

## 2016-07-20 NOTE — Progress Notes (Signed)
Subjective:    Patient ID: Shannon Stephens, female    DOB: 12/03/1945, 70 y.o.   MRN: [id number]  HPI Here for annual f/u of chronic medical problems   Wt Readings from Last 3 Encounters:  07/20/16 132 lb 8 oz (60.1 kg)  07/14/16 134 lb 4 oz (60.9 kg)  07/13/16 134 lb 13 oz (61.1 kg)   bmi is 23  AMW recent  Decreased hearing L ear - pt thinks she hears fine  Declined hep C screening   Had flu shot today   Mammogram 11/16-neg--has it set up for nov 6th (oncology)  Self exam-no lumps/was just checked  Personal hx of breast cancer  On femara now - she tolerates it well   Pap 3/16 nl  No gyn problems or bleeding  Oncologist did not tell her to get one   dexa 1/16-nl in spine and hip / a little lower in forearm  Is scheduled in jan for another one since since she is on femara  Had one fall/ tripped -edge of a rug  No fractures    colonoscopy 7/16-5 year recall   bp is well controlled (has some white coat syndrome)-very good at home  No cp or palpitations or headaches or edema  No side effects to medicines  BP Readings from Last 3 Encounters:  07/20/16 136/84  07/14/16 118/80  07/13/16 (!) 158/88       Chemistry      Component Value Date/Time   NA 141 07/14/2016 1220   K 3.6 07/14/2016 1220   K 3.7 09/01/2014 1509   CL 103 07/14/2016 1220   CO2 34 (H) 07/14/2016 1220   BUN 23 07/14/2016 1220   CREATININE 0.91 07/14/2016 1220      Component Value Date/Time   CALCIUM 10.0 07/14/2016 1220   ALKPHOS 58 07/14/2016 1220   AST 25 07/14/2016 1220   ALT 36 (H) 07/14/2016 1220   BILITOT 0.3 07/14/2016 1220     Hx of hyperglycemia Lab Results  Component Value Date   HGBA1C 5.5 07/14/2016  eats a fairly healthy diet /smaller portions   Hx of hyperlipidemia Lab Results  Component Value Date   CHOL 211 (H) 07/14/2016   CHOL 223 (H) 12/30/2014   CHOL 165 12/27/2013   Lab Results  Component Value Date   HDL 52.40 07/14/2016   HDL 60.60 12/30/2014   HDL  55.30 12/27/2013   Lab Results  Component Value Date   LDLCALC 127 (H) 07/14/2016   LDLCALC 125 (H) 12/30/2014   LDLCALC 71 12/27/2013   Lab Results  Component Value Date   TRIG 160.0 (H) 07/14/2016   TRIG 186.0 (H) 12/30/2014   TRIG 196.0 (H) 12/27/2013   Lab Results  Component Value Date   CHOLHDL 4 07/14/2016   CHOLHDL 4 12/30/2014   CHOLHDL 3 12/27/2013   Lab Results  Component Value Date   LDLDIRECT 157.1 05/09/2011   LDLDIRECT 154.7 02/29/2008   LDLDIRECT 151.3 02/16/2007    Stable with diet / she took simvastatin off and on - makes her muscles hurt  Not had it in a while   Patient Active Problem List   Diagnosis Date Noted  . Encounter for routine gynecological examination 12/30/2014  . Primary cancer of lower-inner quadrant of right female breast ([place]) 08/25/2014  . Encounter for Medicare annual wellness exam 12/27/2013  . Colon cancer screening 12/27/2013  . Abnormal EKG 01/17/2012  . Hyperglycemia 05/13/2011  . Macrocytosis 05/13/2011  . Gynecological examination  05/13/2011  . Routine general medical examination at a health care facility 05/08/2011  . [place] DISEASE, LUMBAR 02/28/2008  . LIVEDO RETICULARIS 02/28/2008  . Hyperlipidemia 01/12/2007  . Generalized anxiety disorder 01/12/2007  . Essential hypertension 01/12/2007  . ALLERGIC RHINITIS 01/12/2007  . SLEEP DISORDER 01/12/2007   Past Medical History:  Diagnosis Date  . Allergic rhinitis   . Anxiety   . Breast cancer of lower-inner quadrant of right female breast [facility]) November 2015   T1c, N0/ ER + ; PR+; Her 2 neu not overexpressed. Oncotype DX: 14 % recurrence risk w/ antiestrogen alone (low intermediate group).  . Burning sensation    chronic,  skin  . Chronic cervicitis    with squamous metaplasia  . History of ultrasound, pelvic 03/29/06   WNL  . Hyperlipidemia 2003  . Hypertension   . Menopause    Past Surgical History:  Procedure Laterality Date  . BREAST BIOPSY    . BREAST  SURGERY Right 09/04/14   Mastectomy   . COLONOSCOPY  3/02   polyps  . COLONOSCOPY  05-26-15   Dr Deatra Ina, 3 mm tubular adenoma in the sigmoid. Repeat exam 2021.  Marland Kitchen DILATION AND CURETTAGE OF UTERUS  1987   for DQB  . MASTECTOMY Right 08/2014   +  . OTHER SURGICAL HISTORY     Neg renal ultrasound   Social History  Substance Use Topics  . Smoking status: Never Smoker  . Smokeless tobacco: Never Used  . Alcohol use No   Family History  Problem Relation Age of Onset  . Diabetes Father   . Hypertension Father   . Heart failure Father   . Coronary artery disease Father   . Hypertension Mother   . Colon cancer Neg Hx   . Colon polyps Neg Hx   . Pancreatic cancer Neg Hx   . Rectal cancer Neg Hx   . Stomach cancer Neg Hx   . Ulcerative colitis Neg Hx   . Esophageal cancer Neg Hx   . Crohn's disease Neg Hx    Allergies  Allergen Reactions  . Alendronate Sodium     FOSAMAX REACTION: body pain- severe all over  . Simvastatin     Muscle pain    Current Outpatient Prescriptions on File Prior to Visit  Medication Sig Dispense Refill  . B Complex Vitamins (VITAMIN B COMPLEX PO) Take by mouth daily.    . benazepril (LOTENSIN) 40 MG tablet TAKE ONE TABLET BY MOUTH ONCE DAILY 90 tablet 0  . Calcium Carbonate-Vitamin D 600-400 MG-UNIT per tablet Take 2 tablets by mouth daily.      . carvedilol (COREG) 25 MG tablet TAKE ONE TABLET BY MOUTH TWICE DAILY 180 tablet 0  . clonazePAM (KLONOPIN) 1 MG tablet TAKE 1 TABLET 3 TIMES A DAY FOR ANXIETY 90 tablet 3  . hydrochlorothiazide (HYDRODIURIL) 25 MG tablet TAKE ONE TABLET BY MOUTH ONCE DAILY 90 tablet 0  . letrozole (FEMARA) 2.5 MG tablet TAKE 1 TABLET (2.5 MG TOTAL) BY MOUTH DAILY. 30 tablet 11  . mirtazapine (REMERON) 15 MG tablet Take 1 tablet (15 mg total) by mouth at bedtime. 30 tablet 11  . Multiple Vitamin (MULTIVITAMIN) tablet Take 1 tablet by mouth daily.    . potassium chloride (K-DUR,KLOR-CON) 10 MEQ tablet Take 1 tablet (10 mEq  total) by mouth daily. 90 tablet 3   No current facility-administered medications on file prior to visit.     Review of Systems Review of Systems  Constitutional: Negative  for fever, appetite change, fatigue and unexpected weight change.  Eyes: Negative for pain and visual disturbance.  Respiratory: Negative for cough and shortness of breath.   Cardiovascular: Negative for cp or palpitations    Gastrointestinal: Negative for nausea, diarrhea and constipation.  Genitourinary: Negative for urgency and frequency.  Skin: Negative for pallor or rash   Neurological: Negative for weakness, light-headedness, numbness and headaches.  Hematological: Negative for adenopathy. Does not bruise/bleed easily.  Psychiatric/Behavioral: Negative for dysphoric mood. The patient is not nervous/anxious.         Objective:   Physical Exam  Constitutional: She appears well-developed and well-nourished. No distress.  Well appearing   HENT:  Head: Normocephalic and atraumatic.  Right Ear: External ear normal.  Left Ear: External ear normal.  Nose: Nose normal.  Mouth/Throat: Oropharynx is clear and moist.  Eyes: Conjunctivae and EOM are normal. Pupils are equal, round, and reactive to light. Right eye exhibits no discharge. Left eye exhibits no discharge. No scleral icterus.  Neck: Normal range of motion. Neck supple. No JVD present. Carotid bruit is not present. No thyromegaly present.  Cardiovascular: Normal rate, regular rhythm, normal heart sounds and intact distal pulses.  Exam reveals no gallop.   Pulmonary/Chest: Effort normal and breath sounds normal. No respiratory distress. She has no wheezes. She has no rales.  Abdominal: Soft. Bowel sounds are normal. She exhibits no distension and no mass. There is no tenderness.  Genitourinary:  Genitourinary Comments: Breast exam not done-recently done by her surgeon  Musculoskeletal: She exhibits no edema or tenderness.  Lymphadenopathy:    She has no  cervical adenopathy.  Neurological: She is alert. She has normal reflexes. No cranial nerve deficit. She exhibits normal muscle tone. Coordination normal.  Mild stable head tremor   Skin: Skin is warm and dry. No rash noted. No erythema. No pallor.  Several comedones on back-expressed easily Some sks and lentigines  Psychiatric: She has a normal mood and affect.          Assessment & Plan:   Problem List Items Addressed This Visit      Cardiovascular and Mediastinum   Essential hypertension    bp in fair control at this time  BP Readings from Last 1 Encounters:  07/20/16 136/84   No changes needed Disc lifstyle change with low sodium diet and exercise  Labs reviewed  bp is generally lower at home Enc an exercise program         Other   Hyperlipidemia    Disc goals for lipids and reasons to control them Rev labs with pt Rev low sat fat diet in detail  Off simvastatin now since it caused muscle pain  Will continue to watch  LDL remains below 130      Hyperglycemia    Lab Results  Component Value Date   HGBA1C 5.5 07/14/2016   This is very well controlled with diet and good wt  Enc her to keep it up        Other Visit Diagnoses    Need for influenza vaccination    -  Primary   Relevant Orders   Flu Vaccine QUAD 36+ mos IM (Completed)

## 2016-07-20 NOTE — Progress Notes (Signed)
Pre visit review using our clinic review tool, if applicable. No additional management support is needed unless otherwise documented below in the visit note. 

## 2016-08-10 ENCOUNTER — Other Ambulatory Visit: Payer: Self-pay | Admitting: Family Medicine

## 2016-08-10 NOTE — Telephone Encounter (Signed)
Px has been called in for pt. Pharm will notify when ready. PC,cma

## 2016-08-10 NOTE — Telephone Encounter (Signed)
Px written for call in   

## 2016-08-10 NOTE — Telephone Encounter (Signed)
Last seen 07/20/16 Last filled #[id number] rf 01/19/16 Please advise, PC,cma

## 2016-08-22 ENCOUNTER — Other Ambulatory Visit: Payer: Self-pay | Admitting: Oncology

## 2016-09-05 ENCOUNTER — Ambulatory Visit
Admission: RE | Admit: 2016-09-05 | Discharge: 2016-09-05 | Disposition: A | Discharge status: Home / Self Care | Payer: Medicare Other | Source: Ambulatory Visit | Attending: Oncology | Admitting: Oncology

## 2016-09-05 ENCOUNTER — Other Ambulatory Visit: Payer: Self-pay | Admitting: Oncology

## 2016-09-05 DIAGNOSIS — Z1231 Encounter for screening mammogram for malignant neoplasm of breast: Secondary | ICD-10-CM

## 2016-09-05 HISTORY — DX: Malignant neoplasm of unspecified site of unspecified female breast: C50.919

## 2016-10-27 ENCOUNTER — Other Ambulatory Visit: Payer: Self-pay | Admitting: Family Medicine

## 2016-11-07 ENCOUNTER — Ambulatory Visit: Payer: PRIVATE HEALTH INSURANCE

## 2016-12-26 ENCOUNTER — Ambulatory Visit
Admission: RE | Admit: 2016-12-26 | Discharge: 2016-12-26 | Disposition: A | Discharge status: Home / Self Care | Payer: Medicare Other | Source: Ambulatory Visit | Attending: Oncology | Admitting: Oncology

## 2016-12-26 DIAGNOSIS — Z780 Asymptomatic menopausal state: Secondary | ICD-10-CM | POA: Diagnosis not present

## 2017-01-11 NOTE — Progress Notes (Signed)
[place]  [phone]  [phone]     Shannon Stephens DOB: 1946/03/30  MR#: [id number]  QIO#:[id number]  Patient Care Team: Abner Greenspan, MD as PCP - General Robert Bellow, MD (General Surgery) Abner Greenspan, MD as Consulting Physician (Family Medicine) Forest Gleason, MD as Consulting Physician (Oncology)  CHIEF COMPLAINT: Pathologic stage Ia adenocarcinoma of the lower inner quadrant of the right breast.  INTERVAL HISTORY:   Patient returns to clinic today for routine six-month follow-up. She continues to tolerate letrozole well without significant side effects. She does not complain of weakness or fatigue today. She has no neurologic complaints. She denies any recent fevers or illnesses. She has no chest pain or shortness of breath. She denies any nausea, vomiting, constipation, or diarrhea. She has no urinary complaints. Patient feels at her baseline and offers no specific complaints today.  REVIEW OF SYSTEMS:   Review of Systems  Constitutional: Negative.  Negative for fever, malaise/fatigue and weight loss.  Respiratory: Negative.  Negative for cough and shortness of breath.   Cardiovascular: Negative.  Negative for chest pain and leg swelling.  Gastrointestinal: Negative.  Negative for abdominal pain.  Genitourinary: Negative.   Musculoskeletal: Negative.   Neurological: Negative.  Negative for sensory change and weakness.  Psychiatric/Behavioral: Negative.  The patient is not nervous/anxious.     As per HPI. Otherwise, a complete review of systems is negative.  ONCOLOGY HISTORY: Oncology History   Carcinoma of breast at 4:00 position at the right breast status post simple mastectomy and anterior lymph node evaluation  Right simple mastectomy done on September 04, 2014 T1c N0 M0 tumor. estrogen receptor positive progesterone receptor positive HER-2 receptor not overexpressed.  Oncotype DX score 22. 10 year distant recurrence rate 14%  with tamoxifen alone.  Patient started on letrozole, 2015.     Primary cancer of lower-inner quadrant of right female breast ([place])   08/25/2014 Initial Diagnosis    Malignant neoplasm of right breast [facility])       PAST MEDICAL HISTORY: Past Medical History:  Diagnosis Date  . Allergic rhinitis   . Anxiety   . Breast cancer ([place]) 2015   RT MASTECTOMY  . Breast cancer of lower-inner quadrant of right female breast [facility]) November 2015   T1c, N0/ ER + ; PR+; Her 2 neu not overexpressed. Oncotype DX: 14 % recurrence risk w/ antiestrogen alone (low intermediate group).  . Burning sensation    chronic,  skin  . Chronic cervicitis    with squamous metaplasia  . History of ultrasound, pelvic 03/29/06   WNL  . Hyperlipidemia 2003  . Hypertension   . Menopause     PAST SURGICAL HISTORY: Past Surgical History:  Procedure Laterality Date  . BREAST BIOPSY Right 2015   POS  . BREAST SURGERY Right 09/04/14   Mastectomy   . COLONOSCOPY  3/02   polyps  . COLONOSCOPY  05-26-15   Dr Deatra Ina, 3 mm tubular adenoma in the sigmoid. Repeat exam 2021.  Marland Kitchen DILATION AND CURETTAGE OF UTERUS  1987   for DQB  . MASTECTOMY Right 08/2014   BREAST CA  . OTHER SURGICAL HISTORY     Neg renal ultrasound    FAMILY HISTORY Family History  Problem Relation Age of Onset  . Diabetes Father   . Hypertension Father   . Heart failure Father   . Coronary artery disease Father   . Hypertension Mother   . Colon cancer Neg Hx   .  Colon polyps Neg Hx   . Pancreatic cancer Neg Hx   . Rectal cancer Neg Hx   . Stomach cancer Neg Hx   . Ulcerative colitis Neg Hx   . Esophageal cancer Neg Hx   . Crohn's disease Neg Hx   . Breast cancer Neg Hx     GYNECOLOGIC HISTORY:  No LMP recorded. Patient is postmenopausal.     ADVANCED DIRECTIVES:    HEALTH MAINTENANCE: Social History  Substance Use Topics  . Smoking status: Never Smoker  . Smokeless tobacco: Never Used  . Alcohol use No      Colonoscopy:  PAP:  Bone density:  Mammogram:  Allergies  Allergen Reactions  . Alendronate Sodium     FOSAMAX REACTION: body pain- severe all over  . Simvastatin     Muscle pain     Current Outpatient Prescriptions  Medication Sig Dispense Refill  . B Complex Vitamins (VITAMIN B COMPLEX PO) Take by mouth daily.    . benazepril (LOTENSIN) 40 MG tablet TAKE ONE TABLET BY MOUTH ONCE DAILY 90 tablet 0  . Calcium Carbonate-Vitamin D 600-400 MG-UNIT per tablet Take 2 tablets by mouth daily.      . carvedilol (COREG) 25 MG tablet TAKE ONE TABLET BY MOUTH TWICE DAILY 180 tablet 1  . clonazePAM (KLONOPIN) 1 MG tablet TAKE 1 TABLET BY MOUTH 3 TIMES A DAY FOR ANXIETY 90 tablet 3  . hydrochlorothiazide (HYDRODIURIL) 25 MG tablet TAKE ONE TABLET BY MOUTH ONCE DAILY 90 tablet 0  . letrozole (FEMARA) 2.5 MG tablet TAKE 1 TABLET (2.5 MG TOTAL) BY MOUTH DAILY. 30 tablet 11  . mirtazapine (REMERON) 15 MG tablet Take 1 tablet (15 mg total) by mouth at bedtime. 30 tablet 11  . Multiple Vitamin (MULTIVITAMIN) tablet Take 1 tablet by mouth daily.    . potassium chloride (K-DUR,KLOR-CON) 10 MEQ tablet Take 1 tablet (10 mEq total) by mouth daily. 90 tablet 3   No current facility-administered medications for this visit.     OBJECTIVE: BP (!) 156/82 (BP Location: Left Arm, Patient Position: Sitting)   Pulse 73   Temp 97.3 F (36.3 C) (Tympanic)   Wt 138 lb 5.4 oz (62.8 kg)   BMI 24.90 kg/m    Body mass index is 24.9 kg/m.    ECOG FS:0 - Asymptomatic  General: Well-developed, well-nourished, no acute distress. Eyes: Pink conjunctiva, anicteric sclera. Lungs: Clear to auscultation bilaterally. Heart: Regular rate and rhythm. No rubs, murmurs, or gallops. Abdomen: Soft, nontender, nondistended. No organomegaly noted, normoactive bowel sounds. Breast: Right chest wall without evidence of recurrence. Left breast and axilla without lumps or masses.    Musculoskeletal: No edema, cyanosis, or  clubbing. Neuro: Alert, answering all questions appropriately. Cranial nerves grossly intact. Skin: No rashes or petechiae noted. Psych: Normal affect.   LAB RESULTS:  No visits with results within 3 Day(s) from this visit.  Latest known visit with results is:  Lab on 07/14/2016  Component Date Value Ref Range Status  . TSH 07/14/2016 1.45  0.35 - 4.50 uIU/mL Final  . Cholesterol 07/14/2016 211* 0 - 200 mg/dL Final  . Triglycerides 07/14/2016 160.0* 0.0 - 149.0 mg/dL Final  . HDL 07/14/2016 52.40  >39.00 mg/dL Final  . VLDL 07/14/2016 32.0  0.0 - 40.0 mg/dL Final  . LDL Cholesterol 07/14/2016 127* 0 - 99 mg/dL Final  . Total CHOL/HDL Ratio 07/14/2016 4   Final  . NonHDL 07/14/2016 158.88   Final  . Hgb A1c MFr Bld  07/14/2016 5.5  4.6 - 6.5 % Final  . Sodium 07/14/2016 141  135 - 145 mEq/L Final  . Potassium 07/14/2016 3.6  3.5 - 5.1 mEq/L Final  . Chloride 07/14/2016 103  96 - 112 mEq/L Final  . CO2 07/14/2016 34* 19 - 32 mEq/L Final  . Glucose, Bld 07/14/2016 122* 70 - 99 mg/dL Final  . BUN 07/14/2016 23  6 - 23 mg/dL Final  . Creatinine, Ser 07/14/2016 0.91  0.40 - 1.20 mg/dL Final  . Total Bilirubin 07/14/2016 0.3  0.2 - 1.2 mg/dL Final  . Alkaline Phosphatase 07/14/2016 58  39 - 117 U/L Final  . AST 07/14/2016 25  0 - 37 U/L Final  . ALT 07/14/2016 36* 0 - 35 U/L Final  . Total Protein 07/14/2016 7.9  6.0 - 8.3 g/dL Final  . Albumin 07/14/2016 4.4  3.5 - 5.2 g/dL Final  . Calcium 07/14/2016 10.0  8.4 - 10.5 mg/dL Final  . GFR 07/14/2016 64.98  >60.00 mL/min Final  . WBC 07/14/2016 5.7  4.0 - 10.5 K/uL Final  . RBC 07/14/2016 4.12  3.87 - 5.11 Mil/uL Final  . Hemoglobin 07/14/2016 14.1  12.0 - 15.0 g/dL Final  . HCT 07/14/2016 41.4  36.0 - 46.0 % Final  . MCV 07/14/2016 100.5* 78.0 - 100.0 fl Final  . MCHC 07/14/2016 34.1  30.0 - 36.0 g/dL Final  . RDW 07/14/2016 12.5  11.5 - 15.5 % Final  . Platelets 07/14/2016 310.0  150.0 - 400.0 K/uL Final  . Neutrophils Relative %  07/14/2016 55.3  43.0 - 77.0 % Final  . Lymphocytes Relative 07/14/2016 32.1  12.0 - 46.0 % Final  . Monocytes Relative 07/14/2016 8.1  3.0 - 12.0 % Final  . Eosinophils Relative 07/14/2016 3.7  0.0 - 5.0 % Final  . Basophils Relative 07/14/2016 0.8  0.0 - 3.0 % Final  . Neutro Abs 07/14/2016 3.2  1.4 - 7.7 K/uL Final  . Lymphs Abs 07/14/2016 1.8  0.7 - 4.0 K/uL Final  . Monocytes Absolute 07/14/2016 0.5  0.1 - 1.0 K/uL Final  . Eosinophils Absolute 07/14/2016 0.2  0.0 - 0.7 K/uL Final  . Basophils Absolute 07/14/2016 0.0  0.0 - 0.1 K/uL Final    STUDIES: No results found.  ASSESSMENT: Pathologic stage Ia adenocarcinoma of the lower inner quadrant of the right breast.  PLAN:    1. Pathologic stage Ia adenocarcinoma of the lower inner quadrant of the right breast: Patient is status post right mastectomy in November 2015. Her Oncotype score of 22 was intermediate risk, but patient did not receive chemotherapy. Her most recent mammogram On September 05, 2016 was reported as BI-RADS 1. Repeat in November 2018. Continue letrozole completing 5 years of treatment in December 2020. Return to clinic in 6 months for routine evaluation. 2. Osteopenia: Patient's most recent bone mineral density on December 26, 2016 reported a T score of -1.2, this is improved from one year prior. Continue calcium and vitamin D. Repeat in January 2020.    Patient expressed understanding and was in agreement with this plan. She also understands that She can call clinic at any time with any questions, concerns, or complaints.     Lloyd Huger, MD   01/15/2017 10:50 AM

## 2017-01-12 ENCOUNTER — Inpatient Hospital Stay: Payer: Medicare Other | Attending: Oncology | Admitting: Oncology

## 2017-01-12 VITALS — BP 156/82 | HR 73 | Temp 97.3°F | Wt 138.3 lb

## 2017-01-12 DIAGNOSIS — F419 Anxiety disorder, unspecified: Secondary | ICD-10-CM | POA: Insufficient documentation

## 2017-01-12 DIAGNOSIS — Z79811 Long term (current) use of aromatase inhibitors: Secondary | ICD-10-CM | POA: Diagnosis not present

## 2017-01-12 DIAGNOSIS — I10 Essential (primary) hypertension: Secondary | ICD-10-CM | POA: Diagnosis not present

## 2017-01-12 DIAGNOSIS — Z9011 Acquired absence of right breast and nipple: Secondary | ICD-10-CM | POA: Diagnosis not present

## 2017-01-12 DIAGNOSIS — C50311 Malignant neoplasm of lower-inner quadrant of right female breast: Secondary | ICD-10-CM | POA: Diagnosis not present

## 2017-01-12 DIAGNOSIS — E785 Hyperlipidemia, unspecified: Secondary | ICD-10-CM | POA: Diagnosis not present

## 2017-01-12 DIAGNOSIS — Z170 Estrogen receptor positive status [ER+]: Secondary | ICD-10-CM | POA: Insufficient documentation

## 2017-01-12 DIAGNOSIS — M8580 Other specified disorders of bone density and structure, unspecified site: Secondary | ICD-10-CM | POA: Diagnosis not present

## 2017-01-12 NOTE — Progress Notes (Signed)
Patient here today for follow up.  Patient c/o "tenderness" at incision area from mastectomy

## 2017-03-06 ENCOUNTER — Other Ambulatory Visit: Payer: Self-pay | Admitting: Family Medicine

## 2017-03-07 NOTE — Telephone Encounter (Signed)
Px written for call in   

## 2017-03-07 NOTE — Telephone Encounter (Signed)
Pt had CPE on 07/20/16, last filled on 08/10/16 #90 tabs with 3 additional refills, please advise

## 2017-03-07 NOTE — Telephone Encounter (Signed)
Rx called in as prescribed 

## 2017-03-22 ENCOUNTER — Ambulatory Visit (INDEPENDENT_AMBULATORY_CARE_PROVIDER_SITE_OTHER): Payer: Medicare Other | Admitting: General Surgery

## 2017-03-22 VITALS — BP 120/68 | HR 80 | Resp 14 | Ht 62.0 in | Wt 141.0 lb

## 2017-03-22 DIAGNOSIS — C50311 Malignant neoplasm of lower-inner quadrant of right female breast: Secondary | ICD-10-CM | POA: Diagnosis not present

## 2017-03-22 DIAGNOSIS — Z170 Estrogen receptor positive status [ER+]: Secondary | ICD-10-CM | POA: Diagnosis not present

## 2017-03-22 NOTE — Progress Notes (Signed)
Patient ID: Shannon Stephens, female   DOB: Jan 12, 1946, 71 y.o.   MRN: [id number]  Chief Complaint  Patient presents with  . Breast Cancer    HPI Shannon Stephens is a 71 y.o. female who presents for a breast evaluation. The most recent mammogram was done on 09/05/2016 and bone density done on 12/26/2016. Marland Kitchen  Patient does perform regular self breast checks and gets regular mammograms done.  She is having some tenderness in the right chest described as an "awareness". She does states she fell back in January and landed on that right side.  HPI  Past Medical History:  Diagnosis Date  . Allergic rhinitis   . Anxiety   . Breast cancer ([place]) 2015   RT MASTECTOMY  . Breast cancer of lower-inner quadrant of right female breast [facility]) November 2015   T1c, N0/ ER + ; PR+; Her 2 neu not overexpressed. Oncotype DX: 14 % recurrence risk w/ antiestrogen alone (low intermediate group).  . Burning sensation    chronic,  skin  . Chronic cervicitis    with squamous metaplasia  . History of ultrasound, pelvic 03/29/06   WNL  . Hyperlipidemia 2003  . Hypertension   . Menopause     Past Surgical History:  Procedure Laterality Date  . BREAST BIOPSY Right 2015   POS  . BREAST SURGERY Right 09/04/14   Mastectomy   . COLONOSCOPY  3/02   polyps  . COLONOSCOPY  05-26-15   Dr Deatra Ina, 3 mm tubular adenoma in the sigmoid. Repeat exam 2021.  Marland Kitchen DILATION AND CURETTAGE OF UTERUS  1987   for DQB  . MASTECTOMY Right 08/2014   BREAST CA  . OTHER SURGICAL HISTORY     Neg renal ultrasound    Family History  Problem Relation Age of Onset  . Diabetes Father   . Hypertension Father   . Heart failure Father   . Coronary artery disease Father   . Hypertension Mother   . Colon cancer Neg Hx   . Colon polyps Neg Hx   . Pancreatic cancer Neg Hx   . Rectal cancer Neg Hx   . Stomach cancer Neg Hx   . Ulcerative colitis Neg Hx   . Esophageal cancer Neg Hx   . Crohn's disease Neg Hx   . Breast cancer Neg Hx      Social History Social History  Substance Use Topics  . Smoking status: Never Smoker  . Smokeless tobacco: Never Used  . Alcohol use No    Allergies  Allergen Reactions  . Alendronate Sodium     FOSAMAX REACTION: body pain- severe all over  . Simvastatin     Muscle pain     Current Outpatient Prescriptions  Medication Sig Dispense Refill  . B Complex Vitamins (VITAMIN B COMPLEX PO) Take by mouth daily.    . benazepril (LOTENSIN) 40 MG tablet TAKE ONE TABLET BY MOUTH ONCE DAILY 90 tablet 0  . Calcium Carbonate-Vitamin D 600-400 MG-UNIT per tablet Take 2 tablets by mouth daily.      . carvedilol (COREG) 25 MG tablet TAKE ONE TABLET BY MOUTH TWICE DAILY 180 tablet 1  . clonazePAM (KLONOPIN) 1 MG tablet TAKE 1 TABLET BY MOUTH 3 TIMES A DAY 90 tablet 3  . hydrochlorothiazide (HYDRODIURIL) 25 MG tablet TAKE ONE TABLET BY MOUTH ONCE DAILY 90 tablet 0  . letrozole (FEMARA) 2.5 MG tablet TAKE 1 TABLET (2.5 MG TOTAL) BY MOUTH DAILY. 30 tablet 11  . mirtazapine (  REMERON) 15 MG tablet Take 1 tablet (15 mg total) by mouth at bedtime. 30 tablet 11  . Multiple Vitamin (MULTIVITAMIN) tablet Take 1 tablet by mouth daily.    . potassium chloride (K-DUR,KLOR-CON) 10 MEQ tablet Take 1 tablet (10 mEq total) by mouth daily. 90 tablet 3   No current facility-administered medications for this visit.     Review of Systems Review of Systems  Constitutional: Negative.   Respiratory: Negative.   Cardiovascular: Negative.     Blood pressure 120/68, pulse 80, resp. rate 14, height 5\' 2"  (1.575 m), weight 141 lb (64 kg).  Physical Exam Physical Exam  Constitutional: She is oriented to person, place, and time. She appears well-developed and well-nourished.  HENT:  Mouth/Throat: Oropharynx is clear and moist.  Eyes: Conjunctivae are normal. No scleral icterus.  Neck: Neck supple.  Cardiovascular: Normal rate, regular rhythm and normal heart sounds.   Pulmonary/Chest: Effort normal and breath  sounds normal. Left breast exhibits no inverted nipple, no mass, no nipple discharge, no skin change and no tenderness.    Right mastectomy site well healed.   Lymphadenopathy:    She has no cervical adenopathy.    She has no axillary adenopathy.       Right: No supraclavicular adenopathy present.  Neurological: She is alert and oriented to person, place, and time.  Skin: Skin is warm and dry.  Psychiatric: Her behavior is normal.    Data Reviewed   September 05, 2016 left breast screening mammogram reviewed. No interval change. BI-RADS-1.  Assessment    No evidence of recurrent cancer.      Plan    Screening mammograms will be ordered in fall 2017 with Dr. Grayland Ormond.    Follow up in one year, no mammograms. Follow up with Dr Grayland Ormond as scheduled.   HPI, Physical Exam, Assessment and Plan have been scribed under the direction and in the presence of Robert Bellow, MD.  Karie Fetch, RN   Robert Bellow 03/22/2017, 7:06 PM

## 2017-03-22 NOTE — Patient Instructions (Signed)
The patient is aware to call back for any questions or concerns.  

## 2017-06-25 ENCOUNTER — Other Ambulatory Visit: Payer: Self-pay | Admitting: Family Medicine

## 2017-06-26 NOTE — Telephone Encounter (Signed)
Please refill to get by until seen  Thanks

## 2017-06-26 NOTE — Telephone Encounter (Signed)
CPE scheduled for 08/02/17, last filled on 10/06/15 #30 tabs with 11 additional refills, please advise

## 2017-06-26 NOTE — Telephone Encounter (Signed)
done

## 2017-07-18 NOTE — Progress Notes (Signed)
[place]  [phone]) [phone]  [phone]     Shannon Stephens DOB: 1946-10-02  MR#: [id number]  ZJI#:[id number]  Patient Care Team: Abner Greenspan, MD as PCP - General Byrnett, Forest Gleason, MD (General Surgery) Tower, Wynelle Fanny, MD as Consulting Physician (Family Medicine) Forest Gleason, MD as Consulting Physician (Oncology)  CHIEF COMPLAINT: Pathologic stage Ia adenocarcinoma of the lower inner quadrant of the right breast.  INTERVAL HISTORY:   Patient returns to clinic today for routine six-month follow-up. She continues to tolerate letrozole well without significant side effects. She does not complain of weakness or fatigue today. She has no neurologic complaints. She denies any recent fevers or illnesses. She has no chest pain or shortness of breath. She denies any nausea, vomiting, constipation, or diarrhea. She has no urinary complaints. Patient feels at her baseline and offers no specific complaints today.  REVIEW OF SYSTEMS:   Review of Systems  Constitutional: Negative.  Negative for fever, malaise/fatigue and weight loss.  Respiratory: Negative.  Negative for cough and shortness of breath.   Cardiovascular: Negative.  Negative for chest pain and leg swelling.  Gastrointestinal: Negative.  Negative for abdominal pain.  Genitourinary: Negative.   Musculoskeletal: Negative.   Skin: Negative.  Negative for rash.  Neurological: Negative.  Negative for sensory change and weakness.  Psychiatric/Behavioral: Negative.  The patient is not nervous/anxious.     As per HPI. Otherwise, a complete review of systems is negative.  ONCOLOGY HISTORY: Oncology History   Carcinoma of breast at 4:00 position at the right breast status post simple mastectomy and anterior lymph node evaluation  Right simple mastectomy done on September 04, 2014 T1c N0 M0 tumor. estrogen receptor positive progesterone receptor positive HER-2 receptor not overexpressed.  Oncotype DX score  22. 10 year distant recurrence rate 14% with tamoxifen alone.  Patient started on letrozole, 2015.     Primary cancer of lower-inner quadrant of right female breast ([place])   08/25/2014 Initial Diagnosis    Malignant neoplasm of right breast [facility])       PAST MEDICAL HISTORY: Past Medical History:  Diagnosis Date  . Allergic rhinitis   . Anxiety   . Breast cancer ([place]) 2015   RT MASTECTOMY  . Breast cancer of lower-inner quadrant of right female breast [facility]) November 2015   T1c, N0/ ER + ; PR+; Her 2 neu not overexpressed. Oncotype DX: 14 % recurrence risk w/ antiestrogen alone (low intermediate group).  . Burning sensation    chronic,  skin  . Chronic cervicitis    with squamous metaplasia  . History of ultrasound, pelvic 03/29/06   WNL  . Hyperlipidemia 2003  . Hypertension   . Menopause     PAST SURGICAL HISTORY: Past Surgical History:  Procedure Laterality Date  . BREAST BIOPSY Right 2015   POS  . BREAST SURGERY Right 09/04/14   Mastectomy   . COLONOSCOPY  3/02   polyps  . COLONOSCOPY  05-26-15   Dr Deatra Ina, 3 mm tubular adenoma in the sigmoid. Repeat exam 2021.  Marland Kitchen DILATION AND CURETTAGE OF UTERUS  1987   for DQB  . MASTECTOMY Right 08/2014   BREAST CA  . OTHER SURGICAL HISTORY     Neg renal ultrasound    FAMILY HISTORY Family History  Problem Relation Age of Onset  . Diabetes Father   . Hypertension Father   . Heart failure Father   . Coronary artery disease Father   . Hypertension Mother   .  Colon cancer Neg Hx   . Colon polyps Neg Hx   . Pancreatic cancer Neg Hx   . Rectal cancer Neg Hx   . Stomach cancer Neg Hx   . Ulcerative colitis Neg Hx   . Esophageal cancer Neg Hx   . Crohn's disease Neg Hx   . Breast cancer Neg Hx     GYNECOLOGIC HISTORY:  No LMP recorded. Patient is postmenopausal.     ADVANCED DIRECTIVES:    HEALTH MAINTENANCE: Social History  Substance Use Topics  . Smoking status: Never Smoker  . Smokeless tobacco: Never Used   . Alcohol use No     Colonoscopy:  PAP:  Bone density:  Mammogram:  Allergies  Allergen Reactions  . Alendronate Sodium     FOSAMAX REACTION: body pain- severe all over  . Simvastatin     Muscle pain     Current Outpatient Prescriptions  Medication Sig Dispense Refill  . B Complex Vitamins (VITAMIN B COMPLEX PO) Take by mouth daily.    . benazepril (LOTENSIN) 40 MG tablet TAKE ONE TABLET BY MOUTH ONCE DAILY 90 tablet 0  . Calcium Carbonate-Vitamin D 600-400 MG-UNIT per tablet Take 2 tablets by mouth daily.      . carvedilol (COREG) 25 MG tablet TAKE ONE TABLET BY MOUTH TWICE DAILY 180 tablet 1  . clonazePAM (KLONOPIN) 1 MG tablet TAKE 1 TABLET BY MOUTH 3 TIMES A DAY 90 tablet 3  . hydrochlorothiazide (HYDRODIURIL) 25 MG tablet TAKE ONE TABLET BY MOUTH ONCE DAILY 90 tablet 0  . letrozole (FEMARA) 2.5 MG tablet TAKE 1 TABLET (2.5 MG TOTAL) BY MOUTH DAILY. 30 tablet 11  . mirtazapine (REMERON) 15 MG tablet TAKE 1 TABLET BY MOUTH AT BEDTIME 30 tablet 1  . Multiple Vitamin (MULTIVITAMIN) tablet Take 1 tablet by mouth daily.    . potassium chloride (K-DUR,KLOR-CON) 10 MEQ tablet Take 1 tablet (10 mEq total) by mouth daily. 90 tablet 3   No current facility-administered medications for this visit.     OBJECTIVE: BP (!) 181/104 (BP Location: Left Arm, Patient Position: Sitting)   Pulse 80   Temp (!) 97.2 F (36.2 C) (Tympanic)   Resp 18   Wt 135 lb 6.4 oz (61.4 kg)   BMI 24.76 kg/m    Body mass index is 24.76 kg/m.    ECOG FS:0 - Asymptomatic  General: Well-developed, well-nourished, no acute distress. Eyes: Pink conjunctiva, anicteric sclera. Lungs: Clear to auscultation bilaterally. Heart: Regular rate and rhythm. No rubs, murmurs, or gallops. Abdomen: Soft, nontender, nondistended. No organomegaly noted, normoactive bowel sounds. Breast: Right chest wall without evidence of recurrence. Left breast and axilla without lumps or masses.    Musculoskeletal: No edema,  cyanosis, or clubbing. Neuro: Alert, answering all questions appropriately. Cranial nerves grossly intact. Skin: No rashes or petechiae noted. Psych: Normal affect.   LAB RESULTS:  No visits with results within 3 Day(s) from this visit.  Latest known visit with results is:  Lab on 07/14/2016  Component Date Value Ref Range Status  . TSH 07/14/2016 1.45  0.35 - 4.50 uIU/mL Final  . Cholesterol 07/14/2016 211* 0 - 200 mg/dL Final   ATP III Classification       Desirable:  < 200 mg/dL               Borderline High:  200 - 239 mg/dL          High:  > = 240 mg/dL  . Triglycerides 07/14/2016 160.0* 0.0 -  149.0 mg/dL Final   Normal:  <150 mg/dLBorderline High:  150 - 199 mg/dL  . HDL 07/14/2016 52.40  >39.00 mg/dL Final  . VLDL 07/14/2016 32.0  0.0 - 40.0 mg/dL Final  . LDL Cholesterol 07/14/2016 127* 0 - 99 mg/dL Final  . Total CHOL/HDL Ratio 07/14/2016 4   Final                  Men          Women1/2 Average Risk     3.4          3.3Average Risk          5.0          4.42X Average Risk          9.6          7.13X Average Risk          15.0          11.0                      . NonHDL 07/14/2016 158.88   Final   NOTE:  Non-HDL goal should be 30 mg/dL higher than patient's LDL goal (i.e. LDL goal of < 70 mg/dL, would have non-HDL goal of < 100 mg/dL)  . Hgb A1c MFr Bld 07/14/2016 5.5  4.6 - 6.5 % Final   Glycemic Control Guidelines for People with Diabetes:Non Diabetic:  <6%Goal of Therapy: <7%Additional Action Suggested:  >8%   . Sodium 07/14/2016 141  135 - 145 mEq/L Final  . Potassium 07/14/2016 3.6  3.5 - 5.1 mEq/L Final  . Chloride 07/14/2016 103  96 - 112 mEq/L Final  . CO2 07/14/2016 34* 19 - 32 mEq/L Final  . Glucose, Bld 07/14/2016 122* 70 - 99 mg/dL Final  . BUN 07/14/2016 23  6 - 23 mg/dL Final  . Creatinine, Ser 07/14/2016 0.91  0.40 - 1.20 mg/dL Final  . Total Bilirubin 07/14/2016 0.3  0.2 - 1.2 mg/dL Final  . Alkaline Phosphatase 07/14/2016 58  39 - 117 U/L Final  . AST  07/14/2016 25  0 - 37 U/L Final  . ALT 07/14/2016 36* 0 - 35 U/L Final  . Total Protein 07/14/2016 7.9  6.0 - 8.3 g/dL Final  . Albumin 07/14/2016 4.4  3.5 - 5.2 g/dL Final  . Calcium 07/14/2016 10.0  8.4 - 10.5 mg/dL Final  . GFR 07/14/2016 64.98  >60.00 mL/min Final  . WBC 07/14/2016 5.7  4.0 - 10.5 K/uL Final  . RBC 07/14/2016 4.12  3.87 - 5.11 Mil/uL Final  . Hemoglobin 07/14/2016 14.1  12.0 - 15.0 g/dL Final  . HCT 07/14/2016 41.4  36.0 - 46.0 % Final  . MCV 07/14/2016 100.5* 78.0 - 100.0 fl Final  . MCHC 07/14/2016 34.1  30.0 - 36.0 g/dL Final  . RDW 07/14/2016 12.5  11.5 - 15.5 % Final  . Platelets 07/14/2016 310.0  150.0 - 400.0 K/uL Final  . Neutrophils Relative % 07/14/2016 55.3  43.0 - 77.0 % Final  . Lymphocytes Relative 07/14/2016 32.1  12.0 - 46.0 % Final  . Monocytes Relative 07/14/2016 8.1  3.0 - 12.0 % Final  . Eosinophils Relative 07/14/2016 3.7  0.0 - 5.0 % Final  . Basophils Relative 07/14/2016 0.8  0.0 - 3.0 % Final  . Neutro Abs 07/14/2016 3.2  1.4 - 7.7 K/uL Final  . Lymphs Abs 07/14/2016 1.8  0.7 - 4.0 K/uL Final  . Monocytes Absolute  07/14/2016 0.5  0.1 - 1.0 K/uL Final  . Eosinophils Absolute 07/14/2016 0.2  0.0 - 0.7 K/uL Final  . Basophils Absolute 07/14/2016 0.0  0.0 - 0.1 K/uL Final    STUDIES: No results found.  ASSESSMENT: Pathologic stage Ia adenocarcinoma of the lower inner quadrant of the right breast.  PLAN:    1. Pathologic stage Ia adenocarcinoma of the lower inner quadrant of the right breast: Patient is status post right mastectomy in November 2015. Her Oncotype score of 22 was intermediate risk, but patient did not receive chemotherapy. Her most recent mammogram on September 05, 2016 was reported as BI-RADS 1. Repeat in November 2018. Continue letrozole completing 5 years of treatment in December 2020. Return to clinic in 6 months for routine evaluation. 2. Osteopenia: Patient's most recent bone mineral density on December 26, 2016 reported a T  score of -1.2, this is improved from one year prior. Continue calcium and vitamin D. Repeat in January 2020.    Approximately 20 minutes was spent in discussion of which greater than 50% was consultation.  Patient expressed understanding and was in agreement with this plan. She also understands that She can call clinic at any time with any questions, concerns, or complaints.     Lloyd Huger, MD   07/26/2017 10:07 AM

## 2017-07-20 ENCOUNTER — Inpatient Hospital Stay: Payer: Medicare Other | Attending: Oncology | Admitting: Oncology

## 2017-07-20 VITALS — BP 181/104 | HR 80 | Temp 97.2°F | Resp 18 | Wt 135.4 lb

## 2017-07-20 DIAGNOSIS — Z9011 Acquired absence of right breast and nipple: Secondary | ICD-10-CM

## 2017-07-20 DIAGNOSIS — Z79811 Long term (current) use of aromatase inhibitors: Secondary | ICD-10-CM | POA: Diagnosis not present

## 2017-07-20 DIAGNOSIS — F419 Anxiety disorder, unspecified: Secondary | ICD-10-CM | POA: Diagnosis not present

## 2017-07-20 DIAGNOSIS — Z79899 Other long term (current) drug therapy: Secondary | ICD-10-CM | POA: Diagnosis not present

## 2017-07-20 DIAGNOSIS — C50311 Malignant neoplasm of lower-inner quadrant of right female breast: Secondary | ICD-10-CM | POA: Diagnosis not present

## 2017-07-20 DIAGNOSIS — E785 Hyperlipidemia, unspecified: Secondary | ICD-10-CM | POA: Diagnosis not present

## 2017-07-20 DIAGNOSIS — M8580 Other specified disorders of bone density and structure, unspecified site: Secondary | ICD-10-CM

## 2017-07-20 DIAGNOSIS — Z170 Estrogen receptor positive status [ER+]: Secondary | ICD-10-CM

## 2017-07-20 DIAGNOSIS — I10 Essential (primary) hypertension: Secondary | ICD-10-CM | POA: Insufficient documentation

## 2017-07-25 ENCOUNTER — Telehealth: Payer: Self-pay | Admitting: Family Medicine

## 2017-07-25 DIAGNOSIS — E7800 Pure hypercholesterolemia, unspecified: Secondary | ICD-10-CM

## 2017-07-25 DIAGNOSIS — I10 Essential (primary) hypertension: Secondary | ICD-10-CM

## 2017-07-25 DIAGNOSIS — R739 Hyperglycemia, unspecified: Secondary | ICD-10-CM

## 2017-07-25 NOTE — Telephone Encounter (Signed)
-----   Message from Eustace Pen, LPN sent at 05/06/2375  9:43 PM EDT ----- Regarding: Labs 9/26 Lab orders needed. Thank you.  Medicare

## 2017-07-26 ENCOUNTER — Ambulatory Visit (INDEPENDENT_AMBULATORY_CARE_PROVIDER_SITE_OTHER): Payer: Medicare Other

## 2017-07-26 VITALS — BP 140/88 | HR 73 | Temp 97.8°F | Ht 62.5 in | Wt 133.5 lb

## 2017-07-26 DIAGNOSIS — Z0000 Encounter for general adult medical examination without abnormal findings: Secondary | ICD-10-CM

## 2017-07-26 DIAGNOSIS — R739 Hyperglycemia, unspecified: Secondary | ICD-10-CM | POA: Diagnosis not present

## 2017-07-26 DIAGNOSIS — E7800 Pure hypercholesterolemia, unspecified: Secondary | ICD-10-CM | POA: Diagnosis not present

## 2017-07-26 DIAGNOSIS — I10 Essential (primary) hypertension: Secondary | ICD-10-CM | POA: Diagnosis not present

## 2017-07-26 DIAGNOSIS — Z1159 Encounter for screening for other viral diseases: Secondary | ICD-10-CM

## 2017-07-26 LAB — CBC WITH DIFFERENTIAL/PLATELET
Basophils Absolute: 0 10*3/uL (ref 0.0–0.1)
Basophils Relative: 0.8 % (ref 0.0–3.0)
EOS PCT: 2.5 % (ref 0.0–5.0)
Eosinophils Absolute: 0.1 10*3/uL (ref 0.0–0.7)
HCT: 43.1 % (ref 36.0–46.0)
HEMOGLOBIN: 14.4 g/dL (ref 12.0–15.0)
LYMPHS ABS: 1.6 10*3/uL (ref 0.7–4.0)
Lymphocytes Relative: 34.3 % (ref 12.0–46.0)
MCHC: 33.5 g/dL (ref 30.0–36.0)
MCV: 101.7 fl — AB (ref 78.0–100.0)
MONOS PCT: 8.8 % (ref 3.0–12.0)
Monocytes Absolute: 0.4 10*3/uL (ref 0.1–1.0)
NEUTROS PCT: 53.6 % (ref 43.0–77.0)
Neutro Abs: 2.5 10*3/uL (ref 1.4–7.7)
Platelets: 251 10*3/uL (ref 150.0–400.0)
RBC: 4.24 Mil/uL (ref 3.87–5.11)
RDW: 13.4 % (ref 11.5–15.5)
WBC: 4.7 10*3/uL (ref 4.0–10.5)

## 2017-07-26 LAB — COMPREHENSIVE METABOLIC PANEL
ALBUMIN: 4.6 g/dL (ref 3.5–5.2)
ALK PHOS: 60 U/L (ref 39–117)
ALT: 18 U/L (ref 0–35)
AST: 20 U/L (ref 0–37)
BUN: 17 mg/dL (ref 6–23)
CO2: 34 mEq/L — ABNORMAL HIGH (ref 19–32)
Calcium: 11.2 mg/dL — ABNORMAL HIGH (ref 8.4–10.5)
Chloride: 99 mEq/L (ref 96–112)
Creatinine, Ser: 0.81 mg/dL (ref 0.40–1.20)
GFR: 74.11 mL/min (ref >60.00)
Glucose, Bld: 120 mg/dL — ABNORMAL HIGH (ref 70–99)
POTASSIUM: 4.1 meq/L (ref 3.5–5.1)
Sodium: 139 mEq/L (ref 135–145)
TOTAL PROTEIN: 8 g/dL (ref 6.0–8.3)
Total Bilirubin: 0.7 mg/dL (ref 0.2–1.2)

## 2017-07-26 LAB — LIPID PANEL
CHOL/HDL RATIO: 4
Cholesterol: 272 mg/dL — ABNORMAL HIGH (ref 0–200)
HDL: 62.8 mg/dL (ref >39.00)
NonHDL: 208.93
TRIGLYCERIDES: 297 mg/dL — AB (ref 0.0–149.0)
VLDL: 59.4 mg/dL — ABNORMAL HIGH (ref 0.0–40.0)

## 2017-07-26 LAB — HEMOGLOBIN A1C: HEMOGLOBIN A1C: 5.5 % (ref 4.6–6.5)

## 2017-07-26 LAB — LDL CHOLESTEROL, DIRECT: Direct LDL: 150 mg/dL

## 2017-07-26 LAB — TSH: TSH: 1.36 u[IU]/mL (ref 0.35–4.50)

## 2017-07-26 NOTE — Progress Notes (Signed)
PCP notes:   Health maintenance:  Flu vaccine - addressed/pt requested vaccine at CPE Hep C screening - completed   Abnormal screenings:   Hearing - failed  Hearing Screening   125Hz  250Hz  500Hz  1000Hz  2000Hz  3000Hz  4000Hz  6000Hz  8000Hz   Right ear:   40 40 40  0    Left ear:   40 40 40  0      Patient concerns:   None  Nurse concerns:  None  Next PCP appt:   08/02/17 @ 1430  I reviewed health advisor's note, was available for consultation, and agree with documentation and plan. Loura Pardon MD

## 2017-07-26 NOTE — Progress Notes (Signed)
Subjective:   Shannon Stephens is a 71 y.o. female who presents for Medicare Annual (Subsequent) preventive examination.  Review of Systems:  N/A Cardiac Risk Factors include: advanced age (>40men, >106 women);dyslipidemia;hypertension     Objective:     Vitals: BP 140/88 (BP Location: Right Arm, Patient Position: Sitting, Cuff Size: Normal)   Pulse 73   Temp 97.8 F (36.6 C) (Oral)   Ht 5' 2.5" (1.588 m) Comment: no shoes  Wt 133 lb 8 oz (60.6 kg)   SpO2 97%   BMI 24.03 kg/m   Body mass index is 24.03 kg/m.   Tobacco History  Smoking Status  . Never Smoker  Smokeless Tobacco  . Never Used     Counseling given: No   Past Medical History:  Diagnosis Date  . Allergic rhinitis   . Anxiety   . Breast cancer ([place]) 2015   RT MASTECTOMY  . Breast cancer of lower-inner quadrant of right female breast [facility]) November 2015   T1c, N0/ ER + ; PR+; Her 2 neu not overexpressed. Oncotype DX: 14 % recurrence risk w/ antiestrogen alone (low intermediate group).  . Burning sensation    chronic,  skin  . Chronic cervicitis    with squamous metaplasia  . History of ultrasound, pelvic 03/29/06   WNL  . Hyperlipidemia 2003  . Hypertension   . Menopause    Past Surgical History:  Procedure Laterality Date  . BREAST BIOPSY Right 2015   POS  . BREAST SURGERY Right 09/04/14   Mastectomy   . COLONOSCOPY  3/02   polyps  . COLONOSCOPY  05-26-15   Dr Deatra Ina, 3 mm tubular adenoma in the sigmoid. Repeat exam 2021.  Marland Kitchen DILATION AND CURETTAGE OF UTERUS  1987   for DQB  . MASTECTOMY Right 08/2014   BREAST CA  . OTHER SURGICAL HISTORY     Neg renal ultrasound   Family History  Problem Relation Age of Onset  . Diabetes Father   . Hypertension Father   . Heart failure Father   . Coronary artery disease Father   . Hypertension Mother   . Colon cancer Neg Hx   . Colon polyps Neg Hx   . Pancreatic cancer Neg Hx   . Rectal cancer Neg Hx   . Stomach cancer Neg Hx   . Ulcerative  colitis Neg Hx   . Esophageal cancer Neg Hx   . Crohn's disease Neg Hx   . Breast cancer Neg Hx    History  Sexual Activity  . Sexual activity: Yes    Outpatient Encounter Prescriptions as of 07/26/2017  Medication Sig  . B Complex Vitamins (VITAMIN B COMPLEX PO) Take by mouth daily.  . benazepril (LOTENSIN) 40 MG tablet TAKE ONE TABLET BY MOUTH ONCE DAILY  . Calcium Carbonate-Vitamin D 600-400 MG-UNIT per tablet Take 2 tablets by mouth daily.    . carvedilol (COREG) 25 MG tablet TAKE ONE TABLET BY MOUTH TWICE DAILY  . clonazePAM (KLONOPIN) 1 MG tablet TAKE 1 TABLET BY MOUTH 3 TIMES A DAY  . hydrochlorothiazide (HYDRODIURIL) 25 MG tablet TAKE ONE TABLET BY MOUTH ONCE DAILY  . letrozole (FEMARA) 2.5 MG tablet TAKE 1 TABLET (2.5 MG TOTAL) BY MOUTH DAILY.  . mirtazapine (REMERON) 15 MG tablet TAKE 1 TABLET BY MOUTH AT BEDTIME  . Multiple Vitamin (MULTIVITAMIN) tablet Take 1 tablet by mouth daily.  . potassium chloride (K-DUR,KLOR-CON) 10 MEQ tablet Take 1 tablet (10 mEq total) by mouth daily.  No facility-administered encounter medications on file as of 07/26/2017.     Activities of Daily Living In your present state of health, do you have any difficulty performing the following activities: 07/26/2017  Hearing? N  Vision? N  Difficulty concentrating or making decisions? N  Walking or climbing stairs? N  Dressing or bathing? N  Doing errands, shopping? N  Preparing Food and eating ? N  Using the Toilet? N  In the past six months, have you accidently leaked urine? N  Do you have problems with loss of bowel control? N  Managing your Medications? N  Managing your Finances? N  Housekeeping or managing your Housekeeping? N  Some recent data might be hidden    Patient Care Team: Tower, Wynelle Fanny, MD as PCP - General Byrnett, Forest Gleason, MD (General Surgery) Tower, Wynelle Fanny, MD as Consulting Physician (Family Medicine) Forest Gleason, MD as Consulting Physician (Oncology)      Assessment:     Hearing Screening   125Hz  250Hz  500Hz  1000Hz  2000Hz  3000Hz  4000Hz  6000Hz  8000Hz   Right ear:   40 40 40  0    Left ear:   40 40 40  0      Visual Acuity Screening   Right eye Left eye Both eyes  Without correction:     With correction: 20/30-1 20/25-1 20/25-1    Exercise Activities and Dietary recommendations Current Exercise Habits: The patient does not participate in regular exercise at present, Exercise limited by: None identified  Goals    . Increase water intake          Starting 07/26/2017, I will attempt to drink at least 6-8 glasses of water daily.       Fall Risk Fall Risk  07/26/2017 07/14/2016 12/30/2014 12/27/2013 10/09/2012  Falls in the past year? No Yes No No No  Comment - accident fall without injury caused by trip on throw rug - - -  Number falls in past yr: - 1 - - -  Injury with Fall? - No - - -  Follow up - Falls evaluation completed - - -   Depression Screen PHQ 2/9 Scores 07/26/2017 07/14/2016 12/30/2014 12/27/2013  PHQ - 2 Score 0 0 0 0  PHQ- 9 Score 0 - - -     Cognitive Function MMSE - Mini Mental State Exam 07/26/2017 07/14/2016  Orientation to time 5 5  Orientation to Place 5 5  Registration 3 3  Attention/ Calculation 0 0  Recall 3 3  Language- name 2 objects 0 0  Language- repeat 1 1  Language- follow 3 step command 3 3  Language- read & follow direction 0 0  Write a sentence 0 0  Copy design 0 0  Total score 20 20     PLEASE NOTE: A Mini-Cog screen was completed. Maximum score is 20. A value of 0 denotes this part of Folstein MMSE was not completed or the patient failed this part of the Mini-Cog screening.   Mini-Cog Screening Orientation to Time - Max 5 pts Orientation to Place - Max 5 pts Registration - Max 3 pts Recall - Max 3 pts Language Repeat - Max 1 pts Language Follow 3 Step Command - Max 3 pts     Immunization History  Administered Date(s) Administered  . Influenza Split 10/25/2012  . Influenza Whole  11/01/2003  . Influenza, High Dose Seasonal PF 09/28/2014, 08/27/2015  . Influenza,inj,Quad PF,6+ Mos 07/20/2016  . Pneumococcal Conjugate-13 12/30/2014  . Pneumococcal Polysaccharide-23 12/27/2013  .  Td 12/01/2002, 10/09/2012   Screening Tests Health Maintenance  Topic Date Due  . INFLUENZA VACCINE  01/28/2018 (Originally 05/31/2017)  . MAMMOGRAM  09/05/2018  . COLONOSCOPY  05/25/2020  . TETANUS/TDAP  10/09/2022  . DEXA SCAN  Completed  . Hepatitis C Screening  Completed  . PNA vac Low Risk Adult  Completed      Plan:     I have personally reviewed and addressed the Medicare Annual Wellness questionnaire and have noted the following in the patient's chart:  A. Medical and social history B. Use of alcohol, tobacco or illicit drugs  C. Current medications and supplements D. Functional ability and status E.  Nutritional status F.  Physical activity G. Advance directives H. List of other physicians I.  Hospitalizations, surgeries, and ER visits in previous 12 months J.  [place] to include hearing, vision, cognitive, depression L. Referrals and appointments - none  In addition, I have reviewed and discussed with patient certain preventive protocols, quality metrics, and best practice recommendations. A written personalized care plan for preventive services as well as general preventive health recommendations were provided to patient.  See attached scanned questionnaire for additional information.   Signed,   Lindell Noe, MHA, BS, LPN Health Coach

## 2017-07-26 NOTE — Patient Instructions (Signed)
Shannon Stephens , Thank you for taking time to come for your Medicare Wellness Visit. I appreciate your ongoing commitment to your health goals. Please review the following plan we discussed and let me know if I can assist you in the future.   These are the goals we discussed: Goals    . Increase water intake          Starting 07/26/2017, I will attempt to drink at least 6-8 glasses of water daily.        This is a list of the screening recommended for you and due dates:  Health Maintenance  Topic Date Due  . Flu Shot  01/28/2018*  . Mammogram  09/05/2018  . Colon Cancer Screening  05/25/2020  . Tetanus Vaccine  10/09/2022  . DEXA scan (bone density measurement)  Completed  .  Hepatitis C: One time screening is recommended by Center for Disease Control  (CDC) for  adults born from [birth year] through 1965.   Completed  . Pneumonia vaccines  Completed  *Topic was postponed. The date shown is not the original due date.   Preventive Care for Adults  A healthy lifestyle and preventive care can promote health and wellness. Preventive health guidelines for adults include the following key practices.  . A routine yearly physical is a good way to check with your health care provider about your health and preventive screening. It is a chance to share any concerns and updates on your health and to receive a thorough exam.  . Visit your dentist for a routine exam and preventive care every 6 months. Brush your teeth twice a day and floss once a day. Good oral hygiene prevents tooth decay and gum disease.  . The frequency of eye exams is based on your age, health, family medical history, use  of contact lenses, and other factors. Follow your health care provider's ecommendations for frequency of eye exams.  . Eat a healthy diet. Foods like vegetables, fruits, whole grains, low-fat dairy products, and lean protein foods contain the nutrients you need without too many calories. Decrease your intake of foods  high in solid fats, added sugars, and salt. Eat the right amount of calories for you. Get information about a proper diet from your health care provider, if necessary.  . Regular physical exercise is one of the most important things you can do for your health. Most adults should get at least 150 minutes of moderate-intensity exercise (any activity that increases your heart rate and causes you to sweat) each week. In addition, most adults need muscle-strengthening exercises on 2 or more days a week.  Silver Sneakers may be a benefit available to you. To determine eligibility, you may visit the website: www.silversneakers.com or contact program at (226) 546-7262 Mon-Fri between 8AM-8PM.   . Maintain a healthy weight. The body mass index (BMI) is a screening tool to identify possible weight problems. It provides an estimate of body fat based on height and weight. Your health care provider can find your BMI and can help you achieve or maintain a healthy weight.   For adults 20 years and older: ? A BMI below 18.5 is considered underweight. ? A BMI of 18.5 to 24.9 is normal. ? A BMI of 25 to 29.9 is considered overweight. ? A BMI of 30 and above is considered obese.   . Maintain normal blood lipids and cholesterol levels by exercising and minimizing your intake of saturated fat. Eat a balanced diet with plenty of fruit and  vegetables. Blood tests for lipids and cholesterol should begin at age 16 and be repeated every 5 years. If your lipid or cholesterol levels are high, you are over 50, or you are at high risk for heart disease, you may need your cholesterol levels checked more frequently. Ongoing high lipid and cholesterol levels should be treated with medicines if diet and exercise are not working.  . If you smoke, find out from your health care provider how to quit. If you do not use tobacco, please do not start.  . If you choose to drink alcohol, please do not consume more than 2 drinks per day.  One drink is considered to be 12 ounces (355 mL) of beer, 5 ounces (148 mL) of wine, or 1.5 ounces (44 mL) of liquor.  . If you are 101-53 years old, ask your health care provider if you should take aspirin to prevent strokes.  . Use sunscreen. Apply sunscreen liberally and repeatedly throughout the day. You should seek shade when your shadow is shorter than you. Protect yourself by wearing long sleeves, pants, a wide-brimmed hat, and sunglasses year round, whenever you are outdoors.  . Once a month, do a whole body skin exam, using a mirror to look at the skin on your back. Tell your health care provider of new moles, moles that have irregular borders, moles that are larger than a pencil eraser, or moles that have changed in shape or color.

## 2017-07-27 LAB — HEPATITIS C ANTIBODY
Hepatitis C Ab: NONREACTIVE
SIGNAL TO CUT-OFF: 0.01 (ref <1.00)

## 2017-08-02 ENCOUNTER — Ambulatory Visit (INDEPENDENT_AMBULATORY_CARE_PROVIDER_SITE_OTHER): Payer: Medicare Other | Admitting: Family Medicine

## 2017-08-02 ENCOUNTER — Encounter: Payer: Self-pay | Admitting: Family Medicine

## 2017-08-02 VITALS — BP 124/70 | HR 77 | Temp 98.5°F | Ht 62.5 in | Wt 134.2 lb

## 2017-08-02 DIAGNOSIS — Z23 Encounter for immunization: Secondary | ICD-10-CM

## 2017-08-02 DIAGNOSIS — E7800 Pure hypercholesterolemia, unspecified: Secondary | ICD-10-CM

## 2017-08-02 DIAGNOSIS — R739 Hyperglycemia, unspecified: Secondary | ICD-10-CM | POA: Diagnosis not present

## 2017-08-02 DIAGNOSIS — D7589 Other specified diseases of blood and blood-forming organs: Secondary | ICD-10-CM

## 2017-08-02 DIAGNOSIS — C50311 Malignant neoplasm of lower-inner quadrant of right female breast: Secondary | ICD-10-CM

## 2017-08-02 DIAGNOSIS — I10 Essential (primary) hypertension: Secondary | ICD-10-CM

## 2017-08-02 MED ORDER — HYDROCHLOROTHIAZIDE 25 MG PO TABS: 25.0000 mg | ORAL_TABLET | Freq: Every day | ORAL | 3 refills | Status: DC

## 2017-08-02 MED ORDER — BENAZEPRIL HCL 40 MG PO TABS: 40.0000 mg | ORAL_TABLET | Freq: Every day | ORAL | 3 refills | Status: DC

## 2017-08-02 MED ORDER — MIRTAZAPINE 15 MG PO TABS: 15.0000 mg | ORAL_TABLET | Freq: Every day | ORAL | 3 refills | Status: DC

## 2017-08-02 MED ORDER — CARVEDILOL 25 MG PO TABS: 25.0000 mg | ORAL_TABLET | Freq: Two times a day (BID) | ORAL | 3 refills | Status: DC

## 2017-08-02 NOTE — Assessment & Plan Note (Signed)
bp in fair control at this time  BP Readings from Last 1 Encounters:  08/02/17 124/70   No changes needed Disc lifstyle change with low sodium diet and exercise  Labs reviewed

## 2017-08-02 NOTE — Assessment & Plan Note (Signed)
Disc goals for lipids and reasons to control them Rev labs with pt Rev low sat fat diet in detail Good control / trig are up however  Disc role of hyperglycemia and watching carbs

## 2017-08-02 NOTE — Progress Notes (Signed)
Subjective:    Patient ID: Shannon Stephens, female    DOB: [birth year], 71 y.o.   MRN: [id number]  HPI Here for annual f/u of chronic medical problems  Feels fairly good overall   A little bit tired -side eff of femara  Mows-no extra exercise    Wt Readings from Last 3 Encounters:  08/02/17 134 lb 4 oz (60.9 kg)  07/26/17 133 lb 8 oz (60.6 kg)  07/20/17 135 lb 6.4 oz (61.4 kg)  great weight - does not eat as many sweets as she used to  24.16 kg/m  Had amw 9/26  Flu shot - today  Had hep C screen-neg  Hearing screen- missed high freq bilat  Doing fine/ thinks her hearing is good   Has eye doctor upcoming    Mammogram 11/17- has her next one planned  Personal hx of breast cancer with R mastectomy Self breast exam -no lumps/ had a check from oncol last wweek  femara- year 3 of 5   Colonoscopy 7/16- 5 y recall   dexa 2/18 -in the normal range and slt improved  femara  Taking ca and D  No falls or fractures this year   Gyn health -no problems   bp is stable today  No cp or palpitations or headaches or edema  No side effects to medicines  BP Readings from Last 3 Encounters:  08/02/17 124/70  07/26/17 140/88  07/20/17 (!) 181/104     No longer takes K dur-too $   otc K has her level fine Lab Results  Component Value Date   K 4.1 07/26/2017    Hyperlipidemia  Lab Results  Component Value Date   CHOL 272 (H) 07/26/2017   CHOL 211 (H) 07/14/2016   CHOL 223 (H) 12/30/2014   Lab Results  Component Value Date   HDL 62.80 07/26/2017   HDL 52.40 07/14/2016   HDL 60.60 12/30/2014   Lab Results  Component Value Date   LDLCALC 127 (H) 07/14/2016   LDLCALC 125 (H) 12/30/2014   LDLCALC 71 12/27/2013   Lab Results  Component Value Date   TRIG 297.0 (H) 07/26/2017   TRIG 160.0 (H) 07/14/2016   TRIG 186.0 (H) 12/30/2014   Lab Results  Component Value Date   CHOLHDL 4 07/26/2017   CHOLHDL 4 07/14/2016   CHOLHDL 4 12/30/2014   Lab Results  Component  Value Date   LDLDIRECT 150.0 07/26/2017   LDLDIRECT 157.1 05/09/2011   LDLDIRECT 154.7 02/29/2008  trig up a bit   Hyperglycemia Lab Results  Component Value Date   HGBA1C 5.5 07/26/2017  good control with diet   Lab Results  Component Value Date   WBC 4.7 07/26/2017   HGB 14.4 07/26/2017   HCT 43.1 07/26/2017   MCV 101.7 (H) 07/26/2017   PLT 251.0 07/26/2017   Lab Results  Component Value Date   CREATININE 0.81 07/26/2017   BUN 17 07/26/2017   NA 139 07/26/2017   K 4.1 07/26/2017   CL 99 07/26/2017   CO2 34 (H) 07/26/2017   Lab Results  Component Value Date   ALT 18 07/26/2017   AST 20 07/26/2017   ALKPHOS 60 07/26/2017   BILITOT 0.7 07/26/2017   Lab Results  Component Value Date   TSH 1.36 07/26/2017    Ca level was slt high ? Why  No symptoms  No hx of parathyroid problems   Patient Active Problem List   Diagnosis Date Noted  . Encounter for  routine gynecological examination 12/30/2014  . Primary cancer of lower-inner quadrant of right female breast ([place]) 08/25/2014  . Encounter for Medicare annual wellness exam 12/27/2013  . Colon cancer screening 12/27/2013  . Abnormal EKG 01/17/2012  . Hyperglycemia 05/13/2011  . Macrocytosis 05/13/2011  . Gynecological examination 05/13/2011  . Routine general medical examination at a health care facility 05/08/2011  . [place] DISEASE, LUMBAR 02/28/2008  . LIVEDO RETICULARIS 02/28/2008  . Hyperlipidemia 01/12/2007  . Generalized anxiety disorder 01/12/2007  . Essential hypertension 01/12/2007  . ALLERGIC RHINITIS 01/12/2007  . SLEEP DISORDER 01/12/2007   Past Medical History:  Diagnosis Date  . Allergic rhinitis   . Anxiety   . Breast cancer ([place]) 2015   RT MASTECTOMY  . Breast cancer of lower-inner quadrant of right female breast [facility]) November 2015   T1c, N0/ ER + ; PR+; Her 2 neu not overexpressed. Oncotype DX: 14 % recurrence risk w/ antiestrogen alone (low intermediate group).  . Burning sensation     chronic,  skin  . Chronic cervicitis    with squamous metaplasia  . History of ultrasound, pelvic 03/29/06   WNL  . Hyperlipidemia 2003  . Hypertension   . Menopause    Past Surgical History:  Procedure Laterality Date  . BREAST BIOPSY Right 2015   POS  . BREAST SURGERY Right 09/04/14   Mastectomy   . COLONOSCOPY  3/02   polyps  . COLONOSCOPY  05-26-15   Dr Deatra Ina, 3 mm tubular adenoma in the sigmoid. Repeat exam 2021.  Marland Kitchen DILATION AND CURETTAGE OF UTERUS  1987   for DQB  . MASTECTOMY Right 08/2014   BREAST CA  . OTHER SURGICAL HISTORY     Neg renal ultrasound   Social History  Substance Use Topics  . Smoking status: Never Smoker  . Smokeless tobacco: Never Used  . Alcohol use No   Family History  Problem Relation Age of Onset  . Diabetes Father   . Hypertension Father   . Heart failure Father   . Coronary artery disease Father   . Hypertension Mother   . Colon cancer Neg Hx   . Colon polyps Neg Hx   . Pancreatic cancer Neg Hx   . Rectal cancer Neg Hx   . Stomach cancer Neg Hx   . Ulcerative colitis Neg Hx   . Esophageal cancer Neg Hx   . Crohn's disease Neg Hx   . Breast cancer Neg Hx    Allergies  Allergen Reactions  . Alendronate Sodium     FOSAMAX REACTION: body pain- severe all over  . Simvastatin     Muscle pain    Current Outpatient Prescriptions on File Prior to Visit  Medication Sig Dispense Refill  . B Complex Vitamins (VITAMIN B COMPLEX PO) Take by mouth daily.    . Calcium Carbonate-Vitamin D 600-400 MG-UNIT per tablet Take 2 tablets by mouth daily.      . clonazePAM (KLONOPIN) 1 MG tablet TAKE 1 TABLET BY MOUTH 3 TIMES A DAY 90 tablet 3  . letrozole (FEMARA) 2.5 MG tablet TAKE 1 TABLET (2.5 MG TOTAL) BY MOUTH DAILY. 30 tablet 11  . Multiple Vitamin (MULTIVITAMIN) tablet Take 1 tablet by mouth daily.     No current facility-administered medications on file prior to visit.      Review of Systems  Constitutional: Positive for fatigue. Negative  for activity change, appetite change, fever and unexpected weight change.  HENT: Negative for congestion, ear pain, rhinorrhea, sinus pressure  and sore throat.   Eyes: Negative for pain, redness and visual disturbance.  Respiratory: Negative for cough, shortness of breath and wheezing.   Cardiovascular: Negative for chest pain and palpitations.  Gastrointestinal: Negative for abdominal pain, blood in stool, constipation and diarrhea.  Endocrine: Negative for polydipsia and polyuria.  Genitourinary: Negative for dysuria, frequency and urgency.  Musculoskeletal: Negative for arthralgias, back pain and myalgias.  Skin: Negative for pallor and rash.  Allergic/Immunologic: Negative for environmental allergies.  Neurological: Negative for dizziness, syncope and headaches.  Hematological: Negative for adenopathy. Does not bruise/bleed easily.  Psychiatric/Behavioral: Negative for decreased concentration and dysphoric mood. The patient is not nervous/anxious.        Objective:   Physical Exam  Constitutional: She appears well-developed and well-nourished. No distress.  HENT:  Head: Normocephalic and atraumatic.  Right Ear: External ear normal.  Left Ear: External ear normal.  Mouth/Throat: Oropharynx is clear and moist.  Eyes: Pupils are equal, round, and reactive to light. Conjunctivae and EOM are normal. No scleral icterus.  Neck: Normal range of motion. Neck supple. No JVD present. Carotid bruit is not present. No thyromegaly present.  Cardiovascular: Normal rate, regular rhythm, normal heart sounds and intact distal pulses.  Exam reveals no gallop.   Pulmonary/Chest: Effort normal and breath sounds normal. No respiratory distress. She has no wheezes. She exhibits no tenderness.  Abdominal: Soft. Bowel sounds are normal. She exhibits no distension, no abdominal bruit and no mass. There is no tenderness.  Genitourinary:  Genitourinary Comments: S/p R mastectomy  Musculoskeletal: Normal  range of motion. She exhibits no edema or tenderness.  Lymphadenopathy:    She has no cervical adenopathy.  Neurological: She is alert. She has normal reflexes. No cranial nerve deficit. She exhibits normal muscle tone. Coordination normal.  Mild chin tremor  Skin: Skin is warm and dry. No rash noted. No erythema. No pallor.  sks and lentigines diffusely  Psychiatric: She has a normal mood and affect.          Assessment & Plan:   Problem List Items Addressed This Visit      Cardiovascular and Mediastinum   Essential hypertension - Primary    bp in fair control at this time  BP Readings from Last 1 Encounters:  08/02/17 124/70   No changes needed Disc lifstyle change with low sodium diet and exercise  Labs reviewed       Relevant Medications   hydrochlorothiazide (HYDRODIURIL) 25 MG tablet   carvedilol (COREG) 25 MG tablet   benazepril (LOTENSIN) 40 MG tablet     Other   Hyperglycemia    Lab Results  Component Value Date   HGBA1C 5.5 07/26/2017   Stable disc imp of low glycemic diet and wt loss to prevent DM2       Hyperlipidemia    Disc goals for lipids and reasons to control them Rev labs with pt Rev low sat fat diet in detail Good control / trig are up however  Disc role of hyperglycemia and watching carbs      Relevant Medications   hydrochlorothiazide (HYDRODIURIL) 25 MG tablet   carvedilol (COREG) 25 MG tablet   benazepril (LOTENSIN) 40 MG tablet   Macrocytosis    Mild  Drinks one glass of wine per night Continue to follow      Primary cancer of lower-inner quadrant of right female breast ([place])    Pt is doing well with femara year 3 of 5  Reassuring dexa result  Seeing  oncology

## 2017-08-02 NOTE — Assessment & Plan Note (Signed)
Lab Results  Component Value Date   HGBA1C 5.5 07/26/2017   Stable disc imp of low glycemic diet and wt loss to prevent DM2

## 2017-08-02 NOTE — Patient Instructions (Addendum)
Aim for 30 minutes of exercise daily  Yoga is good to supplement it also   We will continue to watch labs   Take care of yourself  Flu shot today

## 2017-08-02 NOTE — Assessment & Plan Note (Signed)
Pt is doing well with femara year 3 of 5  Reassuring dexa result  Seeing oncology

## 2017-08-02 NOTE — Assessment & Plan Note (Signed)
Mild  Drinks one glass of wine per night Continue to follow

## 2017-08-18 ENCOUNTER — Other Ambulatory Visit: Payer: Self-pay | Admitting: *Deleted

## 2017-08-18 MED ORDER — LETROZOLE 2.5 MG PO TABS: ORAL_TABLET | ORAL | 1 refills | Status: DC

## 2017-08-19 ENCOUNTER — Other Ambulatory Visit: Payer: Self-pay | Admitting: Family Medicine

## 2017-09-06 ENCOUNTER — Ambulatory Visit
Admission: RE | Admit: 2017-09-06 | Discharge: 2017-09-06 | Disposition: A | Discharge status: Home / Self Care | Payer: Medicare Other | Source: Ambulatory Visit | Attending: Oncology | Admitting: Oncology

## 2017-09-06 DIAGNOSIS — Z1231 Encounter for screening mammogram for malignant neoplasm of breast: Secondary | ICD-10-CM | POA: Insufficient documentation

## 2017-09-06 DIAGNOSIS — C50311 Malignant neoplasm of lower-inner quadrant of right female breast: Secondary | ICD-10-CM

## 2017-10-16 ENCOUNTER — Other Ambulatory Visit: Payer: Self-pay | Admitting: Family Medicine

## 2017-10-16 NOTE — Telephone Encounter (Signed)
Px written for call in   

## 2017-10-16 NOTE — Telephone Encounter (Signed)
CPE was on 08/02/17, last filled on 03/07/17 #90 tabs with 3 additional refill, please advise

## 2017-10-17 NOTE — Telephone Encounter (Signed)
Rx called in as prescribed 

## 2018-01-14 NOTE — Progress Notes (Signed)
[place]  [phone]  [phone]     Shannon Stephens DOB: May 23, 1946  MR#: [id number]  RCB#:[id number]  Patient Care Team: Abner Greenspan, MD as PCP - General Byrnett, Forest Gleason, MD (General Surgery) Tower, Wynelle Fanny, MD as Consulting Physician (Family Medicine) Forest Gleason, MD as Consulting Physician (Oncology)  CHIEF COMPLAINT: Pathologic stage Ia adenocarcinoma of the lower inner quadrant of the right breast.  INTERVAL HISTORY:   Patient returns to clinic today for routine six-month follow-up. She continues to tolerate letrozole well without significant side effects. She has periodic episodes of fatigue, but otherwise feels well. She has no neurologic complaints. She denies any recent fevers or illnesses. She has no chest pain or shortness of breath. She denies any nausea, vomiting, constipation, or diarrhea. She has no urinary complaints. Patient offers no further specific complaints today.  REVIEW OF SYSTEMS:   Review of Systems  Constitutional: Positive for malaise/fatigue. Negative for fever and weight loss.  Respiratory: Negative.  Negative for cough and shortness of breath.   Cardiovascular: Negative.  Negative for chest pain and leg swelling.  Gastrointestinal: Negative.  Negative for abdominal pain.  Genitourinary: Negative.   Musculoskeletal: Negative.   Skin: Negative.  Negative for rash.  Neurological: Negative.  Negative for sensory change and weakness.  Psychiatric/Behavioral: Negative.  The patient is not nervous/anxious.     As per HPI. Otherwise, a complete review of systems is negative.  ONCOLOGY HISTORY: Oncology History   Carcinoma of breast at 4:00 position at the right breast status post simple mastectomy and anterior lymph node evaluation  Right simple mastectomy done on September 04, 2014 T1c N0 M0 tumor. estrogen receptor positive progesterone receptor positive HER-2 receptor not overexpressed.  Oncotype DX score  22. 10 year distant recurrence rate 14% with tamoxifen alone.  Patient started on letrozole, 2015.     Primary cancer of lower-inner quadrant of right female breast ([place])   08/25/2014 Initial Diagnosis    Malignant neoplasm of right breast [facility])       PAST MEDICAL HISTORY: Past Medical History:  Diagnosis Date  . Allergic rhinitis   . Anxiety   . Breast cancer ([place]) 2015   RT MASTECTOMY  . Breast cancer of lower-inner quadrant of right female breast [facility]) November 2015   T1c, N0/ ER + ; PR+; Her 2 neu not overexpressed. Oncotype DX: 14 % recurrence risk w/ antiestrogen alone (low intermediate group).  . Burning sensation    chronic,  skin  . Chronic cervicitis    with squamous metaplasia  . History of ultrasound, pelvic 03/29/06   WNL  . Hyperlipidemia 2003  . Hypertension   . Menopause     PAST SURGICAL HISTORY: Past Surgical History:  Procedure Laterality Date  . BREAST BIOPSY Right 2015   POS  . BREAST SURGERY Right 09/04/14   Mastectomy   . COLONOSCOPY  3/02   polyps  . COLONOSCOPY  05-26-15   Dr Deatra Ina, 3 mm tubular adenoma in the sigmoid. Repeat exam 2021.  Marland Kitchen DILATION AND CURETTAGE OF UTERUS  1987   for DQB  . MASTECTOMY Right 08/2014   BREAST CA  . OTHER SURGICAL HISTORY     Neg renal ultrasound    FAMILY HISTORY Family History  Problem Relation Age of Onset  . Diabetes Father   . Hypertension Father   . Heart failure Father   . Coronary artery disease Father   . Hypertension Mother   . Colon cancer  Neg Hx   . Colon polyps Neg Hx   . Pancreatic cancer Neg Hx   . Rectal cancer Neg Hx   . Stomach cancer Neg Hx   . Ulcerative colitis Neg Hx   . Esophageal cancer Neg Hx   . Crohn's disease Neg Hx   . Breast cancer Neg Hx     GYNECOLOGIC HISTORY:  No LMP recorded. Patient is postmenopausal.     ADVANCED DIRECTIVES:    HEALTH MAINTENANCE: Social History   Tobacco Use  . Smoking status: Never Smoker  . Smokeless tobacco: Never Used    Substance Use Topics  . Alcohol use: No    Alcohol/week: 0.0 oz  . Drug use: No     Colonoscopy:  PAP:  Bone density:  Mammogram:  Allergies  Allergen Reactions  . Alendronate Sodium     FOSAMAX REACTION: body pain- severe all over  . Simvastatin     Muscle pain     Current Outpatient Medications  Medication Sig Dispense Refill  . B Complex Vitamins (VITAMIN B COMPLEX PO) Take by mouth daily.    . benazepril (LOTENSIN) 40 MG tablet Take 1 tablet (40 mg total) by mouth daily. 90 tablet 3  . Calcium Carbonate-Vitamin D 600-400 MG-UNIT per tablet Take 2 tablets by mouth daily.      . carvedilol (COREG) 25 MG tablet Take 1 tablet (25 mg total) by mouth 2 (two) times daily. 180 tablet 3  . clonazePAM (KLONOPIN) 1 MG tablet TAKE 1 TABLET BY MOUTH 3 TIMES A DAY 90 tablet 3  . hydrochlorothiazide (HYDRODIURIL) 25 MG tablet Take 1 tablet (25 mg total) by mouth daily. 90 tablet 3  . letrozole (FEMARA) 2.5 MG tablet TAKE 1 TABLET (2.5 MG TOTAL) BY MOUTH DAILY. 90 tablet 1  . mirtazapine (REMERON) 15 MG tablet Take 1 tablet (15 mg total) by mouth at bedtime. 90 tablet 3  . Multiple Vitamin (MULTIVITAMIN) tablet Take 1 tablet by mouth daily.     No current facility-administered medications for this visit.     OBJECTIVE: BP (!) 191/105 (BP Location: Left Arm, Patient Position: Sitting)   Pulse 74   Temp (!) 96.5 F (35.8 C) (Tympanic)   Resp 18   Wt 134 lb 9.6 oz (61.1 kg)   BMI 24.23 kg/m    Body mass index is 24.23 kg/m.    ECOG FS:0 - Asymptomatic  General: Well-developed, well-nourished, no acute distress. Eyes: Pink conjunctiva, anicteric sclera. Lungs: Clear to auscultation bilaterally. Heart: Regular rate and rhythm. No rubs, murmurs, or gallops. Abdomen: Soft, nontender, nondistended. No organomegaly noted, normoactive bowel sounds. Breast: Right chest wall without evidence of recurrence. Left breast and axilla without lumps or masses.    Musculoskeletal: No edema,  cyanosis, or clubbing. Neuro: Alert, answering all questions appropriately. Cranial nerves grossly intact. Skin: No rashes or petechiae noted. Psych: Normal affect.   LAB RESULTS:  No visits with results within 3 Day(s) from this visit.  Latest known visit with results is:  Clinical Support on 07/26/2017  Component Date Value Ref Range Status  . WBC 07/26/2017 4.7  4.0 - 10.5 K/uL Final  . RBC 07/26/2017 4.24  3.87 - 5.11 Mil/uL Final  . Hemoglobin 07/26/2017 14.4  12.0 - 15.0 g/dL Final  . HCT 07/26/2017 43.1  36.0 - 46.0 % Final  . MCV 07/26/2017 101.7* 78.0 - 100.0 fl Final  . MCHC 07/26/2017 33.5  30.0 - 36.0 g/dL Final  . RDW 07/26/2017 13.4  11.5 -  15.5 % Final  . Platelets 07/26/2017 251.0  150.0 - 400.0 K/uL Final  . Neutrophils Relative % 07/26/2017 53.6  43.0 - 77.0 % Final  . Lymphocytes Relative 07/26/2017 34.3  12.0 - 46.0 % Final  . Monocytes Relative 07/26/2017 8.8  3.0 - 12.0 % Final  . Eosinophils Relative 07/26/2017 2.5  0.0 - 5.0 % Final  . Basophils Relative 07/26/2017 0.8  0.0 - 3.0 % Final  . Neutro Abs 07/26/2017 2.5  1.4 - 7.7 K/uL Final  . Lymphs Abs 07/26/2017 1.6  0.7 - 4.0 K/uL Final  . Monocytes Absolute 07/26/2017 0.4  0.1 - 1.0 K/uL Final  . Eosinophils Absolute 07/26/2017 0.1  0.0 - 0.7 K/uL Final  . Basophils Absolute 07/26/2017 0.0  0.0 - 0.1 K/uL Final  . Sodium 07/26/2017 139  135 - 145 mEq/L Final  . Potassium 07/26/2017 4.1  3.5 - 5.1 mEq/L Final  . Chloride 07/26/2017 99  96 - 112 mEq/L Final  . CO2 07/26/2017 34* 19 - 32 mEq/L Final  . Glucose, Bld 07/26/2017 120* 70 - 99 mg/dL Final  . BUN 07/26/2017 17  6 - 23 mg/dL Final  . Creatinine, Ser 07/26/2017 0.81  0.40 - 1.20 mg/dL Final  . Total Bilirubin 07/26/2017 0.7  0.2 - 1.2 mg/dL Final  . Alkaline Phosphatase 07/26/2017 60  39 - 117 U/L Final  . AST 07/26/2017 20  0 - 37 U/L Final  . ALT 07/26/2017 18  0 - 35 U/L Final  . Total Protein 07/26/2017 8.0  6.0 - 8.3 g/dL Final  . Albumin  07/26/2017 4.6  3.5 - 5.2 g/dL Final  . Calcium 07/26/2017 11.2* 8.4 - 10.5 mg/dL Final  . GFR 07/26/2017 74.11  >60.00 mL/min Final  . Hgb A1c MFr Bld 07/26/2017 5.5  4.6 - 6.5 % Final   Glycemic Control Guidelines for People with Diabetes:Non Diabetic:  <6%Goal of Therapy: <7%Additional Action Suggested:  >8%   . Cholesterol 07/26/2017 272* 0 - 200 mg/dL Final   ATP III Classification       Desirable:  < 200 mg/dL               Borderline High:  200 - 239 mg/dL          High:  > = 240 mg/dL  . Triglycerides 07/26/2017 297.0* 0.0 - 149.0 mg/dL Final   Normal:  <150 mg/dLBorderline High:  150 - 199 mg/dL  . HDL 07/26/2017 62.80  >39.00 mg/dL Final  . VLDL 07/26/2017 59.4* 0.0 - 40.0 mg/dL Final  . Total CHOL/HDL Ratio 07/26/2017 4   Final                  Men          Women1/2 Average Risk     3.4          3.3Average Risk          5.0          4.42X Average Risk          9.6          7.13X Average Risk          15.0          11.0                      . NonHDL 07/26/2017 208.93   Final   NOTE:  Non-HDL goal should be 30 mg/dL higher than patient's LDL  goal (i.e. LDL goal of < 70 mg/dL, would have non-HDL goal of < 100 mg/dL)  . TSH 07/26/2017 1.36  0.35 - 4.50 uIU/mL Final  . Hepatitis C Ab 07/26/2017 NON-REACTIVE  NON-REACTI Final  . SIGNAL TO CUT-OFF 07/26/2017 0.01  <1.00 Final  . Direct LDL 07/26/2017 150.0  mg/dL Final   Optimal:  <100 mg/dLNear or Above Optimal:  100-129 mg/dLBorderline High:  130-159 mg/dLHigh:  160-189 mg/dLVery High:  >190 mg/dL    STUDIES: No results found.  ASSESSMENT: Pathologic stage Ia adenocarcinoma of the lower inner quadrant of the right breast.  PLAN:    1. Pathologic stage Ia adenocarcinoma of the lower inner quadrant of the right breast: Patient is status post right mastectomy in November 2015. Her Oncotype score of 22 was intermediate risk, but patient did not receive chemotherapy. Her most recent mammogram on September 06, 2017 was reported as BI-RADS  1. Repeat in November 2019. Continue letrozole completing 5 years of treatment in December 2020. Return to clinic in 6 months for routine evaluation. 2. Osteopenia: Patient's most recent bone mineral density on December 26, 2016 reported a T score of -1.2, this is improved from one year prior. Continue calcium and vitamin D. Repeat in January 2020.   3.  Hypertension: Patient's blood pressure significantly elevated today.  Continue monitoring and treatment per primary care.  Approximately 20 minutes was spent in discussion of which greater than 50% was consultation.  Patient expressed understanding and was in agreement with this plan. She also understands that She can call clinic at any time with any questions, concerns, or complaints.     Lloyd Huger, MD   01/19/2018 4:28 PM

## 2018-01-17 ENCOUNTER — Other Ambulatory Visit: Payer: Self-pay

## 2018-01-17 ENCOUNTER — Inpatient Hospital Stay: Payer: Medicare Other | Attending: Oncology | Admitting: Oncology

## 2018-01-17 VITALS — BP 191/105 | HR 74 | Temp 96.5°F | Resp 18 | Wt 134.6 lb

## 2018-01-17 DIAGNOSIS — M8580 Other specified disorders of bone density and structure, unspecified site: Secondary | ICD-10-CM | POA: Diagnosis not present

## 2018-01-17 DIAGNOSIS — Z170 Estrogen receptor positive status [ER+]: Secondary | ICD-10-CM | POA: Diagnosis not present

## 2018-01-17 DIAGNOSIS — Z79811 Long term (current) use of aromatase inhibitors: Secondary | ICD-10-CM

## 2018-01-17 DIAGNOSIS — I10 Essential (primary) hypertension: Secondary | ICD-10-CM | POA: Diagnosis not present

## 2018-01-17 DIAGNOSIS — C50311 Malignant neoplasm of lower-inner quadrant of right female breast: Secondary | ICD-10-CM

## 2018-01-17 NOTE — Progress Notes (Signed)
Here for follow up. Stated overall doing well w episodes of fatigue. BP 191/105  Will repeat  .  Repeat bp @1500  188/121,p 78 pt asymptomatic-no headache c/o or lightheadedness. Pt stated her bp is always high when she goes to the Dr.  Dr Grayland Ormond informed

## 2018-02-14 ENCOUNTER — Other Ambulatory Visit: Payer: Self-pay | Admitting: Oncology

## 2018-03-27 ENCOUNTER — Ambulatory Visit: Payer: Medicare Other | Admitting: General Surgery

## 2018-04-06 ENCOUNTER — Other Ambulatory Visit: Payer: Self-pay | Admitting: *Deleted

## 2018-04-06 MED ORDER — CLONAZEPAM 1 MG PO TABS: 1.0000 mg | ORAL_TABLET | Freq: Three times a day (TID) | ORAL | 3 refills | Status: DC

## 2018-04-06 NOTE — Telephone Encounter (Signed)
CPE scheduled for 08/07/18, last filled on 10/16/17 #90 tabs with 3 additional refills

## 2018-04-24 ENCOUNTER — Encounter: Payer: Self-pay | Admitting: General Surgery

## 2018-04-24 ENCOUNTER — Ambulatory Visit (INDEPENDENT_AMBULATORY_CARE_PROVIDER_SITE_OTHER): Payer: Medicare Other | Admitting: General Surgery

## 2018-04-24 VITALS — BP 148/84 | HR 90 | Resp 14 | Ht 65.0 in | Wt 131.0 lb

## 2018-04-24 DIAGNOSIS — C50311 Malignant neoplasm of lower-inner quadrant of right female breast: Secondary | ICD-10-CM

## 2018-04-24 DIAGNOSIS — Z170 Estrogen receptor positive status [ER+]: Secondary | ICD-10-CM

## 2018-04-24 NOTE — Patient Instructions (Addendum)
  The patient is aware to call back for any questions or new concerns.  Mammograms due November (Dr Grayland Ormond) Follow up in office in one

## 2018-04-24 NOTE — Progress Notes (Signed)
Patient ID: Shannon Stephens, female   DOB: 18-Apr-1946, 72 y.o.   MRN: [id number]  Chief Complaint  Patient presents with  . Follow-up    HPI Shannon Stephens is a 72 y.o. female here today for her follow up right breast cancer check up.  Occasional tenderness right chest wall area lifting groceries or anything heavy.  HPI  Past Medical History:  Diagnosis Date  . Allergic rhinitis   . Anxiety   . Breast cancer ([place]) 2015   RT MASTECTOMY  . Breast cancer of lower-inner quadrant of right female breast [facility]) November 2015   T1c, N0/ ER + ; PR+; Her 2 neu not overexpressed. Oncotype DX: 14 % recurrence risk w/ antiestrogen alone (low intermediate group).  . Burning sensation    chronic,  skin  . Chronic cervicitis    with squamous metaplasia  . History of ultrasound, pelvic 03/29/06   WNL  . Hyperlipidemia 2003  . Hypertension   . Menopause     Past Surgical History:  Procedure Laterality Date  . BREAST BIOPSY Right 2015   POS  . BREAST SURGERY Right 09/04/14   Mastectomy   . COLONOSCOPY  3/02   polyps  . COLONOSCOPY  05-26-15   Shannon Deatra Ina, 3 mm tubular adenoma in the sigmoid. Repeat exam 2021.  Marland Kitchen DILATION AND CURETTAGE OF UTERUS  1987   for DQB  . MASTECTOMY Right 08/2014   BREAST CA  . OTHER SURGICAL HISTORY     Neg renal ultrasound    Family History  Problem Relation Age of Onset  . Diabetes Father   . Hypertension Father   . Heart failure Father   . Coronary artery disease Father   . Hypertension Mother   . Colon cancer Neg Hx   . Colon polyps Neg Hx   . Pancreatic cancer Neg Hx   . Rectal cancer Neg Hx   . Stomach cancer Neg Hx   . Ulcerative colitis Neg Hx   . Esophageal cancer Neg Hx   . Crohn's disease Neg Hx   . Breast cancer Neg Hx     Social History Social History   Tobacco Use  . Smoking status: Never Smoker  . Smokeless tobacco: Never Used  Substance Use Topics  . Alcohol use: No    Alcohol/week: 0.0 oz  . Drug use: No    Allergies   Allergen Reactions  . Alendronate Sodium     FOSAMAX REACTION: body pain- severe all over  . Simvastatin     Muscle pain     Current Outpatient Medications  Medication Sig Dispense Refill  . B Complex Vitamins (VITAMIN B COMPLEX PO) Take by mouth daily.    . benazepril (LOTENSIN) 40 MG tablet Take 1 tablet (40 mg total) by mouth daily. 90 tablet 3  . Calcium Carbonate-Vitamin D 600-400 MG-UNIT per tablet Take 2 tablets by mouth daily.      . carvedilol (COREG) 25 MG tablet Take 1 tablet (25 mg total) by mouth 2 (two) times daily. 180 tablet 3  . clonazePAM (KLONOPIN) 1 MG tablet Take 1 tablet (1 mg total) by mouth 3 (three) times daily. 90 tablet 3  . hydrochlorothiazide (HYDRODIURIL) 25 MG tablet Take 1 tablet (25 mg total) by mouth daily. 90 tablet 3  . letrozole (FEMARA) 2.5 MG tablet TAKE 1 TABLET BY MOUTH EVERY DAY 90 tablet 1  . mirtazapine (REMERON) 15 MG tablet Take 1 tablet (15 mg total) by mouth at bedtime. [place]  tablet 3  . Multiple Vitamin (MULTIVITAMIN) tablet Take 1 tablet by mouth daily.    . Potassium 95 MG TABS Take by mouth daily.     No current facility-administered medications for this visit.     Review of Systems Review of Systems  Constitutional: Negative.   Respiratory: Negative.   Cardiovascular: Negative.     Blood pressure (!) 148/84, pulse 90, resp. rate 14, height 5\' 5"  (1.651 m), weight 131 lb (59.4 kg), SpO2 98 %.  Physical Exam Physical Exam  Constitutional: She is oriented to person, place, and time. She appears well-developed and well-nourished.  HENT:  Mouth/Throat: Oropharynx is clear and moist.  Eyes: Conjunctivae are normal. No scleral icterus.  Neck: Neck supple.  Cardiovascular: Normal rate, regular rhythm and normal heart sounds.  Pulmonary/Chest: Effort normal and breath sounds normal. Left breast exhibits no inverted nipple, no mass, no nipple discharge, no skin change and no tenderness.  Right mastectomy site clean     Lymphadenopathy:    She has no cervical adenopathy.    She has no axillary adenopathy.  Neurological: She is alert and oriented to person, place, and time.  Skin: Skin is warm and dry.  Psychiatric: Her behavior is normal.    Data Reviewed Left breast screening mammogram dated September 06, 2017 was reviewed.  No interval change.  BI-RADS-1.  Assessment    Stable breast, chest wall exam now nearly 4 years after management of a T1CN0 carcinoma.  Mild muscle tenderness with activity.    Plan  Mammograms due November  (Shannon Stephens) Follow up in office in one year  HPI, Physical Exam, Assessment and Plan have been scribed under the direction and in the presence of Shannon Bellow, MD. Shannon Fetch, RN  I have completed the exam and reviewed the above documentation for accuracy and completeness.  I agree with the above.  Shannon Stephens.  Shannon Stephens, M.D., F.A.C.S.   Shannon Stephens 04/25/2018, 6:54 AM

## 2018-07-09 NOTE — Progress Notes (Signed)
[place]  [phone]) [phone]  Fax:(336) [phone]     Shannon Stephens DOB: Apr 15, 1946  MR#: [id number]  HUO#:[id number]  Patient Care Team: Abner Greenspan, MD as PCP - General Byrnett, Forest Gleason, MD (General Surgery) Tower, Wynelle Fanny, MD as Consulting Physician (Family Medicine) Forest Gleason, MD (Inactive) as Consulting Physician (Oncology)  CHIEF COMPLAINT: Pathologic stage Ia adenocarcinoma of the lower inner quadrant of the right breast.  INTERVAL HISTORY: Patient returns to clinic today for routine six-month evaluation.  She currently feels well and is asymptomatic.  She continues to tolerate letrozole well without significant side effects. She has no neurologic complaints. She denies any recent fevers or illnesses. She has no chest pain or shortness of breath. She denies any nausea, vomiting, constipation, or diarrhea. She has no urinary complaints.  Patient feels at her baseline offers no specific complaints today.  REVIEW OF SYSTEMS:   Review of Systems  Constitutional: Negative for fever, malaise/fatigue and weight loss.  Respiratory: Negative.  Negative for cough and shortness of breath.   Cardiovascular: Negative.  Negative for chest pain and leg swelling.  Gastrointestinal: Negative.  Negative for abdominal pain.  Genitourinary: Negative.  Negative for dysuria.  Musculoskeletal: Negative.  Negative for back pain.  Skin: Negative.  Negative for rash.  Neurological: Negative.  Negative for sensory change, weakness and headaches.  Psychiatric/Behavioral: Negative.  The patient is not nervous/anxious.     As per HPI. Otherwise, a complete review of systems is negative.  ONCOLOGY HISTORY: Oncology History   Carcinoma of breast at 4:00 position at the right breast status post simple mastectomy and anterior lymph node evaluation  Right simple mastectomy done on September 04, 2014 T1c N0 M0 tumor. estrogen receptor positive progesterone receptor positive HER-2  receptor not overexpressed.  Oncotype DX score 22. 10 year distant recurrence rate 14% with tamoxifen alone.  Patient started on letrozole, 2015.     Malignant neoplasm of lower-inner quadrant of right breast of female, estrogen receptor positive ([place])   08/25/2014 Initial Diagnosis    Malignant neoplasm of right breast [facility])     PAST MEDICAL HISTORY: Past Medical History:  Diagnosis Date  . Allergic rhinitis   . Anxiety   . Breast cancer ([place]) 2015   RT MASTECTOMY  . Breast cancer of lower-inner quadrant of right female breast [facility]) November 2015   T1c, N0/ ER + ; PR+; Her 2 neu not overexpressed. Oncotype DX: 14 % recurrence risk w/ antiestrogen alone (low intermediate group).  . Burning sensation    chronic,  skin  . Chronic cervicitis    with squamous metaplasia  . History of ultrasound, pelvic 03/29/06   WNL  . Hyperlipidemia 2003  . Hypertension   . Menopause     PAST SURGICAL HISTORY: Past Surgical History:  Procedure Laterality Date  . BREAST BIOPSY Right 2015   POS  . BREAST SURGERY Right 09/04/14   Mastectomy   . COLONOSCOPY  3/02   polyps  . COLONOSCOPY  05-26-15   Dr Deatra Ina, 3 mm tubular adenoma in the sigmoid. Repeat exam 2021.  Marland Kitchen DILATION AND CURETTAGE OF UTERUS  1987   for DQB  . MASTECTOMY Right 08/2014   BREAST CA  . OTHER SURGICAL HISTORY     Neg renal ultrasound    FAMILY HISTORY Family History  Problem Relation Age of Onset  . Diabetes Father   . Hypertension Father   . Heart failure Father   . Coronary artery disease Father   .  Hypertension Mother   . Colon cancer Neg Hx   . Colon polyps Neg Hx   . Pancreatic cancer Neg Hx   . Rectal cancer Neg Hx   . Stomach cancer Neg Hx   . Ulcerative colitis Neg Hx   . Esophageal cancer Neg Hx   . Crohn's disease Neg Hx   . Breast cancer Neg Hx     GYNECOLOGIC HISTORY:  No LMP recorded. Patient is postmenopausal.     ADVANCED DIRECTIVES:    HEALTH MAINTENANCE: Social History    Tobacco Use  . Smoking status: Never Smoker  . Smokeless tobacco: Never Used  Substance Use Topics  . Alcohol use: No    Alcohol/week: 0.0 standard drinks  . Drug use: No     Colonoscopy:  PAP:  Bone density:  Mammogram:  Allergies  Allergen Reactions  . Alendronate Sodium     FOSAMAX REACTION: body pain- severe all over  . Simvastatin     Muscle pain     Current Outpatient Medications  Medication Sig Dispense Refill  . B Complex Vitamins (VITAMIN B COMPLEX PO) Take by mouth daily.    . benazepril (LOTENSIN) 40 MG tablet Take 1 tablet (40 mg total) by mouth daily. 90 tablet 3  . Calcium Carbonate-Vitamin D 600-400 MG-UNIT per tablet Take 2 tablets by mouth daily.      . carvedilol (COREG) 25 MG tablet Take 1 tablet (25 mg total) by mouth 2 (two) times daily. 180 tablet 3  . clonazePAM (KLONOPIN) 1 MG tablet Take 1 tablet (1 mg total) by mouth 3 (three) times daily. 90 tablet 3  . hydrochlorothiazide (HYDRODIURIL) 25 MG tablet Take 1 tablet (25 mg total) by mouth daily. 90 tablet 3  . letrozole (FEMARA) 2.5 MG tablet TAKE 1 TABLET BY MOUTH EVERY DAY 90 tablet 1  . mirtazapine (REMERON) 15 MG tablet Take 1 tablet (15 mg total) by mouth at bedtime. 90 tablet 3  . Multiple Vitamin (MULTIVITAMIN) tablet Take 1 tablet by mouth daily.    . Potassium 95 MG TABS Take by mouth daily.     No current facility-administered medications for this visit.     OBJECTIVE: BP (!) 166/92 (BP Location: Left Arm, Patient Position: Sitting)   Pulse 79   Temp 98.1 F (36.7 C) (Tympanic)   Resp 16   Wt 130 lb (59 kg)   BMI 21.63 kg/m    Body mass index is 21.63 kg/m.    ECOG FS:0 - Asymptomatic  General: Well-developed, well-nourished, no acute distress. Eyes: Pink conjunctiva, anicteric sclera. HEENT: Normocephalic, moist mucous membranes. Breast: Right chest wall without evidence of recurrence.  Left breast and axilla without lumps or masses. Lungs: Clear to auscultation  bilaterally. Heart: Regular rate and rhythm. No rubs, murmurs, or gallops. Abdomen: Soft, nontender, nondistended. No organomegaly noted, normoactive bowel sounds. Musculoskeletal: No edema, cyanosis, or clubbing. Neuro: Alert, answering all questions appropriately. Cranial nerves grossly intact. Skin: No rashes or petechiae noted. Psych: Normal affect.  LAB RESULTS:  No visits with results within 3 Day(s) from this visit.  Latest known visit with results is:  Clinical Support on 07/26/2017  Component Date Value Ref Range Status  . WBC 07/26/2017 4.7  4.0 - 10.5 K/uL Final  . RBC 07/26/2017 4.24  3.87 - 5.11 Mil/uL Final  . Hemoglobin 07/26/2017 14.4  12.0 - 15.0 g/dL Final  . HCT 07/26/2017 43.1  36.0 - 46.0 % Final  . MCV 07/26/2017 101.7* 78.0 - 100.0 fl  Final  . MCHC 07/26/2017 33.5  30.0 - 36.0 g/dL Final  . RDW 07/26/2017 13.4  11.5 - 15.5 % Final  . Platelets 07/26/2017 251.0  150.0 - 400.0 K/uL Final  . Neutrophils Relative % 07/26/2017 53.6  43.0 - 77.0 % Final  . Lymphocytes Relative 07/26/2017 34.3  12.0 - 46.0 % Final  . Monocytes Relative 07/26/2017 8.8  3.0 - 12.0 % Final  . Eosinophils Relative 07/26/2017 2.5  0.0 - 5.0 % Final  . Basophils Relative 07/26/2017 0.8  0.0 - 3.0 % Final  . Neutro Abs 07/26/2017 2.5  1.4 - 7.7 K/uL Final  . Lymphs Abs 07/26/2017 1.6  0.7 - 4.0 K/uL Final  . Monocytes Absolute 07/26/2017 0.4  0.1 - 1.0 K/uL Final  . Eosinophils Absolute 07/26/2017 0.1  0.0 - 0.7 K/uL Final  . Basophils Absolute 07/26/2017 0.0  0.0 - 0.1 K/uL Final  . Sodium 07/26/2017 139  135 - 145 mEq/L Final  . Potassium 07/26/2017 4.1  3.5 - 5.1 mEq/L Final  . Chloride 07/26/2017 99  96 - 112 mEq/L Final  . CO2 07/26/2017 34* 19 - 32 mEq/L Final  . Glucose, Bld 07/26/2017 120* 70 - 99 mg/dL Final  . BUN 07/26/2017 17  6 - 23 mg/dL Final  . Creatinine, Ser 07/26/2017 0.81  0.40 - 1.20 mg/dL Final  . Total Bilirubin 07/26/2017 0.7  0.2 - 1.2 mg/dL Final  . Alkaline  Phosphatase 07/26/2017 60  39 - 117 U/L Final  . AST 07/26/2017 20  0 - 37 U/L Final  . ALT 07/26/2017 18  0 - 35 U/L Final  . Total Protein 07/26/2017 8.0  6.0 - 8.3 g/dL Final  . Albumin 07/26/2017 4.6  3.5 - 5.2 g/dL Final  . Calcium 07/26/2017 11.2* 8.4 - 10.5 mg/dL Final  . GFR 07/26/2017 74.11  >60.00 mL/min Final  . Hgb A1c MFr Bld 07/26/2017 5.5  4.6 - 6.5 % Final   Glycemic Control Guidelines for People with Diabetes:Non Diabetic:  <6%Goal of Therapy: <7%Additional Action Suggested:  >8%   . Cholesterol 07/26/2017 272* 0 - 200 mg/dL Final   ATP III Classification       Desirable:  < 200 mg/dL               Borderline High:  200 - 239 mg/dL          High:  > = 240 mg/dL  . Triglycerides 07/26/2017 297.0* 0.0 - 149.0 mg/dL Final   Normal:  <150 mg/dLBorderline High:  150 - 199 mg/dL  . HDL 07/26/2017 62.80  >39.00 mg/dL Final  . VLDL 07/26/2017 59.4* 0.0 - 40.0 mg/dL Final  . Total CHOL/HDL Ratio 07/26/2017 4   Final                  Men          Women1/2 Average Risk     3.4          3.3Average Risk          5.0          4.42X Average Risk          9.6          7.13X Average Risk          15.0          11.0                      .  NonHDL 07/26/2017 208.93   Final   NOTE:  Non-HDL goal should be 30 mg/dL higher than patient's LDL goal (i.e. LDL goal of < 70 mg/dL, would have non-HDL goal of < 100 mg/dL)  . TSH 07/26/2017 1.36  0.35 - 4.50 uIU/mL Final  . Hepatitis C Ab 07/26/2017 NON-REACTIVE  NON-REACTI Final  . SIGNAL TO CUT-OFF 07/26/2017 0.01  <1.00 Final  . Direct LDL 07/26/2017 150.0  mg/dL Final   Optimal:  <100 mg/dLNear or Above Optimal:  100-129 mg/dLBorderline High:  130-159 mg/dLHigh:  160-189 mg/dLVery High:  >190 mg/dL    STUDIES: No results found.  ASSESSMENT: Pathologic stage Ia adenocarcinoma of the lower inner quadrant of the right breast.  PLAN:    1. Pathologic stage Ia adenocarcinoma of the lower inner quadrant of the right breast: Patient is status post  right mastectomy in November 2015. Her Oncotype score of 22 was intermediate risk, but patient did not receive chemotherapy.  Continue letrozole for a total of 5 years completing treatment in December 2020.  Her most recent mammogram on September 06, 2017 was reported as BI-RADS 1. Repeat in November 2019.  Return to clinic in 6 months for routine evaluation. 2. Osteopenia: Patient's most recent bone mineral density on December 26, 2016 reported a T score of -1.2, this is improved from one year prior. Continue calcium and vitamin D.  Repeat in February 2020.  I spent a total of 20 minutes face-to-face with the patient of which greater than 50% of the visit was spent in counseling and coordination of care as detailed above.   Patient expressed understanding and was in agreement with this plan. She also understands that She can call clinic at any time with any questions, concerns, or complaints.     Lloyd Huger, MD   07/14/2018 10:44 AM

## 2018-07-12 ENCOUNTER — Inpatient Hospital Stay: Payer: Medicare Other | Attending: Oncology | Admitting: Oncology

## 2018-07-12 ENCOUNTER — Encounter: Payer: Self-pay | Admitting: Oncology

## 2018-07-12 VITALS — BP 166/92 | HR 79 | Temp 98.1°F | Resp 16 | Wt 130.0 lb

## 2018-07-12 DIAGNOSIS — C50311 Malignant neoplasm of lower-inner quadrant of right female breast: Secondary | ICD-10-CM | POA: Diagnosis not present

## 2018-07-12 DIAGNOSIS — M8580 Other specified disorders of bone density and structure, unspecified site: Secondary | ICD-10-CM | POA: Diagnosis not present

## 2018-07-12 DIAGNOSIS — Z79811 Long term (current) use of aromatase inhibitors: Secondary | ICD-10-CM | POA: Diagnosis not present

## 2018-07-12 DIAGNOSIS — Z170 Estrogen receptor positive status [ER+]: Secondary | ICD-10-CM | POA: Diagnosis not present

## 2018-07-29 ENCOUNTER — Telehealth: Payer: Self-pay | Admitting: Family Medicine

## 2018-07-29 DIAGNOSIS — R739 Hyperglycemia, unspecified: Secondary | ICD-10-CM

## 2018-07-29 DIAGNOSIS — E7800 Pure hypercholesterolemia, unspecified: Secondary | ICD-10-CM

## 2018-07-29 DIAGNOSIS — I10 Essential (primary) hypertension: Secondary | ICD-10-CM

## 2018-07-29 NOTE — Telephone Encounter (Signed)
-----   Message from Eustace Pen, LPN sent at 3/53/9122  4:25 PM EDT ----- Regarding: Labs 10/2 Lab orders needed. Thank you.  Insurance:  [employer]

## 2018-08-01 ENCOUNTER — Ambulatory Visit (INDEPENDENT_AMBULATORY_CARE_PROVIDER_SITE_OTHER): Payer: Medicare Other

## 2018-08-01 VITALS — BP 142/82 | HR 82 | Temp 98.6°F | Ht 62.5 in | Wt 129.5 lb

## 2018-08-01 DIAGNOSIS — I10 Essential (primary) hypertension: Secondary | ICD-10-CM

## 2018-08-01 DIAGNOSIS — E7800 Pure hypercholesterolemia, unspecified: Secondary | ICD-10-CM

## 2018-08-01 DIAGNOSIS — Z23 Encounter for immunization: Secondary | ICD-10-CM

## 2018-08-01 DIAGNOSIS — R739 Hyperglycemia, unspecified: Secondary | ICD-10-CM

## 2018-08-01 DIAGNOSIS — Z0000 Encounter for general adult medical examination without abnormal findings: Secondary | ICD-10-CM

## 2018-08-01 LAB — TSH: TSH: 1.02 u[IU]/mL (ref 0.35–4.50)

## 2018-08-01 LAB — LIPID PANEL
CHOLESTEROL: 237 mg/dL — AB (ref 0–200)
HDL: 70.5 mg/dL (ref >39.00)
LDL CALC: 133 mg/dL — AB (ref 0–99)
NonHDL: 166.47
Total CHOL/HDL Ratio: 3
Triglycerides: 166 mg/dL — ABNORMAL HIGH (ref 0.0–149.0)
VLDL: 33.2 mg/dL (ref 0.0–40.0)

## 2018-08-01 LAB — COMPREHENSIVE METABOLIC PANEL
ALBUMIN: 4.5 g/dL (ref 3.5–5.2)
ALT: 17 U/L (ref 0–35)
AST: 19 U/L (ref 0–37)
Alkaline Phosphatase: 55 U/L (ref 39–117)
BILIRUBIN TOTAL: 0.5 mg/dL (ref 0.2–1.2)
BUN: 16 mg/dL (ref 6–23)
CALCIUM: 10.4 mg/dL (ref 8.4–10.5)
CO2: 30 mEq/L (ref 19–32)
Chloride: 102 mEq/L (ref 96–112)
Creatinine, Ser: 0.91 mg/dL (ref 0.40–1.20)
GFR: 64.6 mL/min (ref >60.00)
GLUCOSE: 106 mg/dL — AB (ref 70–99)
Potassium: 4 mEq/L (ref 3.5–5.1)
Sodium: 141 mEq/L (ref 135–145)
Total Protein: 8 g/dL (ref 6.0–8.3)

## 2018-08-01 LAB — CBC WITH DIFFERENTIAL/PLATELET
BASOS ABS: 0 10*3/uL (ref 0.0–0.1)
Basophils Relative: 0.6 % (ref 0.0–3.0)
Eosinophils Absolute: 0.2 10*3/uL (ref 0.0–0.7)
Eosinophils Relative: 3.5 % (ref 0.0–5.0)
HCT: 41.1 % (ref 36.0–46.0)
HEMOGLOBIN: 14.1 g/dL (ref 12.0–15.0)
LYMPHS ABS: 1.5 10*3/uL (ref 0.7–4.0)
Lymphocytes Relative: 31.3 % (ref 12.0–46.0)
MCHC: 34.2 g/dL (ref 30.0–36.0)
MCV: 99 fl (ref 78.0–100.0)
MONOS PCT: 8.4 % (ref 3.0–12.0)
Monocytes Absolute: 0.4 10*3/uL (ref 0.1–1.0)
NEUTROS PCT: 56.2 % (ref 43.0–77.0)
Neutro Abs: 2.7 10*3/uL (ref 1.4–7.7)
Platelets: 283 10*3/uL (ref 150.0–400.0)
RBC: 4.16 Mil/uL (ref 3.87–5.11)
RDW: 12.7 % (ref 11.5–15.5)
WBC: 4.8 10*3/uL (ref 4.0–10.5)

## 2018-08-01 LAB — HEMOGLOBIN A1C: HEMOGLOBIN A1C: 5.4 % (ref 4.6–6.5)

## 2018-08-01 NOTE — Patient Instructions (Signed)
Shannon Stephens , Thank you for taking time to come for your Medicare Wellness Visit. I appreciate your ongoing commitment to your health goals. Please review the following plan we discussed and let me know if I can assist you in the future.   These are the goals we discussed: Goals    . Increase water intake     Starting 08/01/2018, I will attempt to drink at least 6-8 glasses of water daily.        This is a list of the screening recommended for you and due dates:  Health Maintenance  Topic Date Due  . Mammogram  09/07/2019  . Colon Cancer Screening  05/25/2020  . Tetanus Vaccine  10/09/2022  . Flu Shot  Completed  . DEXA scan (bone density measurement)  Completed  .  Hepatitis C: One time screening is recommended by Center for Disease Control  (CDC) for  adults born from [birth year] through 1965.   Completed  . Pneumonia vaccines  Completed   Preventive Care for Adults  A healthy lifestyle and preventive care can promote health and wellness. Preventive health guidelines for adults include the following key practices.  . A routine yearly physical is a good way to check with your health care provider about your health and preventive screening. It is a chance to share any concerns and updates on your health and to receive a thorough exam.  . Visit your dentist for a routine exam and preventive care every 6 months. Brush your teeth twice a day and floss once a day. Good oral hygiene prevents tooth decay and gum disease.  . The frequency of eye exams is based on your age, health, family medical history, use  of contact lenses, and other factors. Follow your health care provider's recommendations for frequency of eye exams.  . Eat a healthy diet. Foods like vegetables, fruits, whole grains, low-fat dairy products, and lean protein foods contain the nutrients you need without too many calories. Decrease your intake of foods high in solid fats, added sugars, and salt. Eat the right amount of calories  for you. Get information about a proper diet from your health care provider, if necessary.  . Regular physical exercise is one of the most important things you can do for your health. Most adults should get at least 150 minutes of moderate-intensity exercise (any activity that increases your heart rate and causes you to sweat) each week. In addition, most adults need muscle-strengthening exercises on 2 or more days a week.  Silver Sneakers may be a benefit available to you. To determine eligibility, you may visit the website: www.silversneakers.com or contact program at 337-218-2862 Mon-Fri between 8AM-8PM.   . Maintain a healthy weight. The body mass index (BMI) is a screening tool to identify possible weight problems. It provides an estimate of body fat based on height and weight. Your health care provider can find your BMI and can help you achieve or maintain a healthy weight.   For adults 20 years and older: ? A BMI below 18.5 is considered underweight. ? A BMI of 18.5 to 24.9 is normal. ? A BMI of 25 to 29.9 is considered overweight. ? A BMI of 30 and above is considered obese.   . Maintain normal blood lipids and cholesterol levels by exercising and minimizing your intake of saturated fat. Eat a balanced diet with plenty of fruit and vegetables. Blood tests for lipids and cholesterol should begin at age 77 and be repeated every 5 years.  If your lipid or cholesterol levels are high, you are over 50, or you are at high risk for heart disease, you may need your cholesterol levels checked more frequently. Ongoing high lipid and cholesterol levels should be treated with medicines if diet and exercise are not working.  . If you smoke, find out from your health care provider how to quit. If you do not use tobacco, please do not start.  . If you choose to drink alcohol, please do not consume more than 2 drinks per day. One drink is considered to be 12 ounces (355 mL) of beer, 5 ounces (148 mL) of  wine, or 1.5 ounces (44 mL) of liquor.  . If you are 12-62 years old, ask your health care provider if you should take aspirin to prevent strokes.  . Use sunscreen. Apply sunscreen liberally and repeatedly throughout the day. You should seek shade when your shadow is shorter than you. Protect yourself by wearing long sleeves, pants, a wide-brimmed hat, and sunglasses year round, whenever you are outdoors.  . Once a month, do a whole body skin exam, using a mirror to look at the skin on your back. Tell your health care provider of new moles, moles that have irregular borders, moles that are larger than a pencil eraser, or moles that have changed in shape or color.

## 2018-08-01 NOTE — Progress Notes (Signed)
PCP notes:   Health maintenance:  Flu vaccine - administered  Abnormal screenings:   None  Patient concerns:   None  Nurse concerns:  None  Next PCP appt:   08/07/18 @ 1130  I reviewed health advisor's note, was available for consultation, and agree with documentation and plan. Loura Pardon MD

## 2018-08-01 NOTE — Progress Notes (Signed)
Subjective:   JENAVIEVE FREDA is a 72 y.o. female who presents for Medicare Annual (Subsequent) preventive examination.  Review of Systems:  N/A Cardiac Risk Factors include: advanced age (>42men, >49 women);dyslipidemia;hypertension     Objective:     Vitals: BP (!) 142/82 (BP Location: Left Arm, Patient Position: Sitting, Cuff Size: Normal)   Pulse 82   Temp 98.6 F (37 C) (Oral)   Ht 5' 2.5" (1.588 m) Comment: no shoes  Wt 129 lb 8 oz (58.7 kg)   SpO2 98%   BMI 23.31 kg/m   Body mass index is 23.31 kg/m.  Advanced Directives 08/01/2018 07/12/2018 01/17/2018 07/26/2017 07/20/2017 01/12/2017 07/14/2016  Does Patient Have a Medical Advance Directive? Yes Yes Yes Yes Yes Yes Yes  Type of Paramedic of [place];Living will Living will Living will Living will Living will;Healthcare Power of [place] will Living will  Does patient want to make changes to medical advance directive? - - - - - Yes (MAU/Ambulatory/Procedural Areas - Information given) No - Patient declined  Copy of [place] in Chart? No - copy requested - - - - - No - copy requested    Tobacco Social History   Tobacco Use  Smoking Status Never Smoker  Smokeless Tobacco Never Used     Counseling given: No   Clinical Intake:  Pre-visit preparation completed: Yes  Pain : No/denies pain Pain Score: 0-No pain     Nutritional Risks: None Diabetes: No  How often do you need to have someone help you when you read instructions, pamphlets, or other written materials from your doctor or pharmacy?: 1 - Never What is the last grade level you completed in school?: 12th grade  Interpreter Needed?: No  Comments: pt is a widow and lives alone Information entered by :: LPinson, LPN  Past Medical History:  Diagnosis Date  . Allergic rhinitis   . Anxiety   . Breast cancer ([place]) 2015   RT MASTECTOMY  . Breast cancer of lower-inner quadrant of right female breast [facility])  November 2015   T1c, N0/ ER + ; PR+; Her 2 neu not overexpressed. Oncotype DX: 14 % recurrence risk w/ antiestrogen alone (low intermediate group).  . Burning sensation    chronic,  skin  . Chronic cervicitis    with squamous metaplasia  . History of ultrasound, pelvic 03/29/06   WNL  . Hyperlipidemia 2003  . Hypertension   . Menopause    Past Surgical History:  Procedure Laterality Date  . BREAST BIOPSY Right 2015   POS  . BREAST SURGERY Right 09/04/14   Mastectomy   . COLONOSCOPY  3/02   polyps  . COLONOSCOPY  05-26-15   Dr Deatra Ina, 3 mm tubular adenoma in the sigmoid. Repeat exam 2021.  Marland Kitchen DILATION AND CURETTAGE OF UTERUS  1987   for DQB  . MASTECTOMY Right 08/2014   BREAST CA  . OTHER SURGICAL HISTORY     Neg renal ultrasound   Family History  Problem Relation Age of Onset  . Diabetes Father   . Hypertension Father   . Heart failure Father   . Coronary artery disease Father   . Hypertension Mother   . Colon cancer Neg Hx   . Colon polyps Neg Hx   . Pancreatic cancer Neg Hx   . Rectal cancer Neg Hx   . Stomach cancer Neg Hx   . Ulcerative colitis Neg Hx   . Esophageal cancer Neg Hx   .  Crohn's disease Neg Hx   . Breast cancer Neg Hx    Social History   Socioeconomic History  . Marital status: Widowed    Spouse name: Not on file  . Number of children: 0  . Years of education: Not on file  . Highest education level: Not on file  Occupational History  . Occupation: Herbalist  Social Needs  . Financial resource strain: Not on file  . Food insecurity:    Worry: Not on file    Inability: Not on file  . Transportation needs:    Medical: Not on file    Non-medical: Not on file  Tobacco Use  . Smoking status: Never Smoker  . Smokeless tobacco: Never Used  Substance and Sexual Activity  . Alcohol use: No    Alcohol/week: 0.0 standard drinks  . Drug use: No  . Sexual activity: Yes  Lifestyle  . Physical activity:    Days per week: Not on file     Minutes per session: Not on file  . Stress: Not on file  Relationships  . Social connections:    Talks on phone: Not on file    Gets together: Not on file    Attends religious service: Not on file    Active member of club or organization: Not on file    Attends meetings of clubs or organizations: Not on file    Relationship status: Not on file  Other Topics Concern  . Not on file  Social History Narrative   Husband had MI and surgery, lots of stress, husband has prostate ca   Has 2 sisters    Outpatient Encounter Medications as of 08/01/2018  Medication Sig  . B Complex Vitamins (VITAMIN B COMPLEX PO) Take by mouth daily.  . benazepril (LOTENSIN) 40 MG tablet Take 1 tablet (40 mg total) by mouth daily.  . Calcium Carbonate-Vitamin D 600-400 MG-UNIT per tablet Take 2 tablets by mouth daily.    . carvedilol (COREG) 25 MG tablet Take 1 tablet (25 mg total) by mouth 2 (two) times daily.  . clonazePAM (KLONOPIN) 1 MG tablet Take 1 tablet (1 mg total) by mouth 3 (three) times daily.  . hydrochlorothiazide (HYDRODIURIL) 25 MG tablet Take 1 tablet (25 mg total) by mouth daily.  Marland Kitchen letrozole (FEMARA) 2.5 MG tablet TAKE 1 TABLET BY MOUTH EVERY DAY  . mirtazapine (REMERON) 15 MG tablet Take 1 tablet (15 mg total) by mouth at bedtime.  . Multiple Vitamin (MULTIVITAMIN) tablet Take 1 tablet by mouth daily.  . Potassium 95 MG TABS Take by mouth daily.   No facility-administered encounter medications on file as of 08/01/2018.     Activities of Daily Living In your present state of health, do you have any difficulty performing the following activities: 08/01/2018  Hearing? N  Vision? N  Difficulty concentrating or making decisions? N  Walking or climbing stairs? N  Dressing or bathing? N  Doing errands, shopping? N  Preparing Food and eating ? N  Using the Toilet? N  In the past six months, have you accidently leaked urine? N  Do you have problems with loss of bowel control? N  Managing your  Medications? N  Managing your Finances? N  Housekeeping or managing your Housekeeping? N  Some recent data might be hidden    Patient Care Team: Tower, Wynelle Fanny, MD as PCP - General Byrnett, Forest Gleason, MD (General Surgery) Tower, Wynelle Fanny, MD as Consulting Physician (Family Medicine) Forest Gleason, MD (  Inactive) as Consulting Physician (Oncology)    Assessment:   This is a routine wellness examination for [place].  Exercise Activities and Dietary recommendations Current Exercise Habits: The patient does not participate in regular exercise at present, Exercise limited by: None identified  Goals    . Increase water intake     Starting 08/01/2018, I will attempt to drink at least 6-8 glasses of water daily.        Fall Risk Fall Risk  08/01/2018 07/26/2017 07/14/2016 12/30/2014 12/27/2013  Falls in the past year? No No Yes No No  Comment - - accident fall without injury caused by trip on throw rug - -  Number falls in past yr: - - 1 - -  Injury with Fall? - - No - -  Follow up - - Falls evaluation completed - -   Depression Screen PHQ 2/9 Scores 08/01/2018 07/26/2017 07/14/2016 12/30/2014  PHQ - 2 Score 0 0 0 0  PHQ- 9 Score 0 0 - -     Cognitive Function MMSE - Mini Mental State Exam 08/01/2018 07/26/2017 07/14/2016  Orientation to time 5 5 5   Orientation to Place 5 5 5   Registration 3 3 3   Attention/ Calculation 0 0 0  Recall 3 3 3   Language- name 2 objects 0 0 0  Language- repeat 1 1 1   Language- follow 3 step command 3 3 3   Language- read & follow direction 0 0 0  Write a sentence 0 0 0  Copy design 0 0 0  Total score 20 20 20      PLEASE NOTE: A Mini-Cog screen was completed. Maximum score is 20. A value of 0 denotes this part of [place] MMSE was not completed or the patient failed this part of the Mini-Cog screening.   Mini-Cog Screening Orientation to Time - Max 5 pts Orientation to Place - Max 5 pts Registration - Max 3 pts Recall - Max 3 pts Language Repeat - Max 1  pts Language Follow 3 Step Command - Max 3 pts     Immunization History  Administered Date(s) Administered  . Influenza Split 10/25/2012  . Influenza Whole 11/01/2003  . Influenza, High Dose Seasonal PF 09/28/2014, 08/27/2015  . Influenza,inj,Quad PF,6+ Mos 07/20/2016, 08/02/2017, 08/01/2018  . Pneumococcal Conjugate-13 12/30/2014  . Pneumococcal Polysaccharide-23 12/27/2013  . Td 12/01/2002, 10/09/2012   Screening Tests Health Maintenance  Topic Date Due  . MAMMOGRAM  09/07/2019  . COLONOSCOPY  05/25/2020  . TETANUS/TDAP  10/09/2022  . INFLUENZA VACCINE  Completed  . DEXA SCAN  Completed  . Hepatitis C Screening  Completed  . PNA vac Low Risk Adult  Completed      Plan:     I have personally reviewed, addressed, and noted the following in the patient's chart:  A. Medical and social history B. Use of alcohol, tobacco or illicit drugs  C. Current medications and supplements D. Functional ability and status E.  Nutritional status F.  Physical activity G. Advance directives H. List of other physicians I.  Hospitalizations, surgeries, and ER visits in previous 12 months J.  [place] to include hearing, vision, cognitive, depression L. Referrals and appointments - none  In addition, I have reviewed and discussed with patient certain preventive protocols, quality metrics, and best practice recommendations. A written personalized care plan for preventive services as well as general preventive health recommendations were provided to patient.  See attached scanned questionnaire for additional information.   Signed,   Lindell Noe,  MHA, BS, LPN Health Coach

## 2018-08-03 ENCOUNTER — Other Ambulatory Visit: Payer: Self-pay | Admitting: Oncology

## 2018-08-07 ENCOUNTER — Ambulatory Visit (INDEPENDENT_AMBULATORY_CARE_PROVIDER_SITE_OTHER): Payer: Medicare Other | Admitting: Family Medicine

## 2018-08-07 ENCOUNTER — Encounter: Payer: Self-pay | Admitting: Family Medicine

## 2018-08-07 VITALS — BP 128/65 | HR 79 | Temp 98.1°F | Ht 62.5 in | Wt 130.5 lb

## 2018-08-07 DIAGNOSIS — C50311 Malignant neoplasm of lower-inner quadrant of right female breast: Secondary | ICD-10-CM

## 2018-08-07 DIAGNOSIS — M79605 Pain in left leg: Secondary | ICD-10-CM

## 2018-08-07 DIAGNOSIS — I10 Essential (primary) hypertension: Secondary | ICD-10-CM | POA: Diagnosis not present

## 2018-08-07 DIAGNOSIS — R739 Hyperglycemia, unspecified: Secondary | ICD-10-CM | POA: Diagnosis not present

## 2018-08-07 DIAGNOSIS — Z170 Estrogen receptor positive status [ER+]: Secondary | ICD-10-CM

## 2018-08-07 DIAGNOSIS — E7800 Pure hypercholesterolemia, unspecified: Secondary | ICD-10-CM

## 2018-08-07 DIAGNOSIS — M79604 Pain in right leg: Secondary | ICD-10-CM | POA: Diagnosis not present

## 2018-08-07 DIAGNOSIS — M79606 Pain in leg, unspecified: Secondary | ICD-10-CM | POA: Insufficient documentation

## 2018-08-07 MED ORDER — BENAZEPRIL HCL 40 MG PO TABS: 40.0000 mg | ORAL_TABLET | Freq: Every day | ORAL | 3 refills | Status: DC

## 2018-08-07 MED ORDER — MIRTAZAPINE 15 MG PO TABS: 15.0000 mg | ORAL_TABLET | Freq: Every day | ORAL | 3 refills | Status: DC

## 2018-08-07 MED ORDER — CARVEDILOL 25 MG PO TABS: 25.0000 mg | ORAL_TABLET | Freq: Two times a day (BID) | ORAL | 3 refills | Status: DC

## 2018-08-07 MED ORDER — HYDROCHLOROTHIAZIDE 25 MG PO TABS: 25.0000 mg | ORAL_TABLET | Freq: Every day | ORAL | 3 refills | Status: DC

## 2018-08-07 NOTE — Assessment & Plan Note (Signed)
Disc goals for lipids and reasons to control them Rev last labs with pt Rev low sat fat diet in detail Excellent HDL

## 2018-08-07 NOTE — Assessment & Plan Note (Signed)
Lab Results  Component Value Date   HGBA1C 5.4 08/01/2018   Stable/well controlled  disc imp of low glycemic diet and wt loss to prevent DM2

## 2018-08-07 NOTE — Assessment & Plan Note (Signed)
Disc poss of hip OA (pain starts in the groin)  Also poss knee OA  Very inflexible on exam - especially hamstrings  Recommend stretching program /or yoga If no imp -would do imaging

## 2018-08-07 NOTE — Assessment & Plan Note (Signed)
Doing well s/p mastectomy  femara year 4 of 5-tolerating well  Keeping up with bone density screening No vaginal bleeding or gyn problems  Continue oncology and surgical f/u

## 2018-08-07 NOTE — Assessment & Plan Note (Signed)
bp in fair control at this time  BP Readings from Last 1 Encounters:  08/07/18 128/65   No changes needed Most recent labs reviewed  Disc lifstyle change with low sodium diet and exercise

## 2018-08-07 NOTE — Progress Notes (Signed)
Subjective:    Patient ID: Shannon Stephens, female    DOB: Jun 13, 1946, 72 y.o.   MRN: [id number]  HPI  Here for annual f/u of chronic health problems   Pt states her legs hurt  On and off for 3-4 mo  All the way from upper thigh/hip to knees  Achy pain  She is on femara (she told Dr Grayland Ormond and he told her to take analgesic)  Worse to walk  Heating pad helps     Wt Readings from Last 3 Encounters:  08/07/18 130 lb 8 oz (59.2 kg)  08/01/18 129 lb 8 oz (58.7 kg)  07/12/18 130 lb (59 kg)  stable wt -good  She has less appetite during the day than she used to  Eats more at night  Picky eater (not a lot of sweets)  23.49 kg/m   Had amw on 10/2 Given flu vaccine No concerns  Mammogram 11/18 -has it scheduled form 09/07/18 Self breast exam-no changes  Dr Fleet Contras did exam at the end of May Personal hx of breast cancer with R mastectomy  femara year 4 of 5  colonosocpy 7/16 with 5 y recall   dexa 2/18 - BMD in the normal range  Scheduled for on 12/27/18 Takes ca and D  No falls or fx   Zoster status - she had zostavax - 2016 at [facility]   Gyn-nl pap 3/16 No new sexual partners  No gyn symptoms  No bleeding   bp is stable today  At home 622 systolic (tends to run high at the doctor)  At home always below 297 systolic /good  No cp or palpitations or headaches or edema  No side effects to medicines  BP Readings from Last 3 Encounters:  08/07/18 (!) 144/68  08/01/18 (!) 142/82  07/12/18 (!) 166/92   benazapril 40 mg  Carvedilol 25 mg bid  hctz 25 mg qd  re check BP: 128/65    Pulse Readings from Last 3 Encounters:  08/07/18 79  08/01/18 82  07/12/18 79     Lab Results  Component Value Date   CREATININE 0.91 08/01/2018   BUN 16 08/01/2018   NA 141 08/01/2018   K 4.0 08/01/2018   CL 102 08/01/2018   CO2 30 08/01/2018   Lab Results  Component Value Date   ALT 17 08/01/2018   AST 19 08/01/2018   ALKPHOS 55 08/01/2018   BILITOT 0.5 08/01/2018   glucose 106  Hyperglycemia Lab Results  Component Value Date   HGBA1C 5.4 08/01/2018     Hyperlipidemia Lab Results  Component Value Date   CHOL 237 (H) 08/01/2018   CHOL 272 (H) 07/26/2017   CHOL 211 (H) 07/14/2016   Lab Results  Component Value Date   HDL 70.50 08/01/2018   HDL 62.80 07/26/2017   HDL 52.40 07/14/2016   Lab Results  Component Value Date   LDLCALC 133 (H) 08/01/2018   LDLCALC 127 (H) 07/14/2016   LDLCALC 125 (H) 12/30/2014   Lab Results  Component Value Date   TRIG 166.0 (H) 08/01/2018   TRIG 297.0 (H) 07/26/2017   TRIG 160.0 (H) 07/14/2016   Lab Results  Component Value Date   CHOLHDL 3 08/01/2018   CHOLHDL 4 07/26/2017   CHOLHDL 4 07/14/2016   Lab Results  Component Value Date   LDLDIRECT 150.0 07/26/2017   LDLDIRECT 157.1 05/09/2011   LDLDIRECT 154.7 02/29/2008  improved a bit from last year   Not exercising much due to  her leg pain  Was prior to now  Lab Results  Component Value Date   TSH 1.02 08/01/2018    Lab Results  Component Value Date   WBC 4.8 08/01/2018   HGB 14.1 08/01/2018   HCT 41.1 08/01/2018   MCV 99.0 08/01/2018   PLT 283.0 08/01/2018    Patient Active Problem List   Diagnosis Date Noted  . Leg pain 08/07/2018  . Encounter for routine gynecological examination 12/30/2014  . Malignant neoplasm of lower-inner quadrant of right breast of female, estrogen receptor positive ([place]) 08/25/2014  . Encounter for Medicare annual wellness exam 12/27/2013  . Colon cancer screening 12/27/2013  . Abnormal EKG 01/17/2012  . Hyperglycemia 05/13/2011  . Macrocytosis 05/13/2011  . Gynecological examination 05/13/2011  . Routine general medical examination at a health care facility 05/08/2011  . [place] DISEASE, LUMBAR 02/28/2008  . LIVEDO RETICULARIS 02/28/2008  . Hyperlipidemia 01/12/2007  . Generalized anxiety disorder 01/12/2007  . Essential hypertension 01/12/2007  . ALLERGIC RHINITIS 01/12/2007  . SLEEP DISORDER  01/12/2007   Past Medical History:  Diagnosis Date  . Allergic rhinitis   . Anxiety   . Breast cancer ([place]) 2015   RT MASTECTOMY  . Breast cancer of lower-inner quadrant of right female breast [facility]) November 2015   T1c, N0/ ER + ; PR+; Her 2 neu not overexpressed. Oncotype DX: 14 % recurrence risk w/ antiestrogen alone (low intermediate group).  . Burning sensation    chronic,  skin  . Chronic cervicitis    with squamous metaplasia  . History of ultrasound, pelvic 03/29/06   WNL  . Hyperlipidemia 2003  . Hypertension   . Menopause    Past Surgical History:  Procedure Laterality Date  . BREAST BIOPSY Right 2015   POS  . BREAST SURGERY Right 09/04/14   Mastectomy   . COLONOSCOPY  3/02   polyps  . COLONOSCOPY  05-26-15   Dr Deatra Ina, 3 mm tubular adenoma in the sigmoid. Repeat exam 2021.  Marland Kitchen DILATION AND CURETTAGE OF UTERUS  1987   for DQB  . MASTECTOMY Right 08/2014   BREAST CA  . OTHER SURGICAL HISTORY     Neg renal ultrasound   Social History   Tobacco Use  . Smoking status: Never Smoker  . Smokeless tobacco: Never Used  Substance Use Topics  . Alcohol use: No    Alcohol/week: 0.0 standard drinks  . Drug use: No   Family History  Problem Relation Age of Onset  . Diabetes Father   . Hypertension Father   . Heart failure Father   . Coronary artery disease Father   . Hypertension Mother   . Colon cancer Neg Hx   . Colon polyps Neg Hx   . Pancreatic cancer Neg Hx   . Rectal cancer Neg Hx   . Stomach cancer Neg Hx   . Ulcerative colitis Neg Hx   . Esophageal cancer Neg Hx   . Crohn's disease Neg Hx   . Breast cancer Neg Hx    Allergies  Allergen Reactions  . Alendronate Sodium     FOSAMAX REACTION: body pain- severe all over  . Simvastatin     Muscle pain    Current Outpatient Medications on File Prior to Visit  Medication Sig Dispense Refill  . B Complex Vitamins (VITAMIN B COMPLEX PO) Take by mouth daily.    . Calcium Carbonate-Vitamin D 600-400  MG-UNIT per tablet Take 2 tablets by mouth daily.      Marland Kitchen  clonazePAM (KLONOPIN) 1 MG tablet Take 1 tablet (1 mg total) by mouth 3 (three) times daily. 90 tablet 3  . letrozole (FEMARA) 2.5 MG tablet TAKE 1 TABLET BY MOUTH EVERY DAY 90 tablet 1  . Multiple Vitamin (MULTIVITAMIN) tablet Take 1 tablet by mouth daily.    . Potassium 95 MG TABS Take by mouth daily.     No current facility-administered medications on file prior to visit.      Review of Systems  Constitutional: Negative for activity change, appetite change, fatigue, fever and unexpected weight change.  HENT: Negative for congestion, ear pain, rhinorrhea, sinus pressure and sore throat.   Eyes: Negative for pain, redness and visual disturbance.  Respiratory: Negative for cough, shortness of breath and wheezing.   Cardiovascular: Negative for chest pain and palpitations.  Gastrointestinal: Negative for abdominal pain, blood in stool, constipation and diarrhea.  Endocrine: Negative for polydipsia and polyuria.  Genitourinary: Negative for dysuria, frequency and urgency.  Musculoskeletal: Negative for arthralgias, back pain and myalgias.       Pain in upper legs from groin to knee   Skin: Negative for pallor and rash.  Allergic/Immunologic: Negative for environmental allergies.  Neurological: Negative for dizziness, syncope and headaches.  Hematological: Negative for adenopathy. Does not bruise/bleed easily.  Psychiatric/Behavioral: Negative for decreased concentration and dysphoric mood. The patient is not nervous/anxious.        Objective:   Physical Exam  Constitutional: She appears well-developed and well-nourished. No distress.  Well appearing   HENT:  Head: Normocephalic and atraumatic.  Right Ear: External ear normal.  Left Ear: External ear normal.  Mouth/Throat: Oropharynx is clear and moist.  Eyes: Pupils are equal, round, and reactive to light. Conjunctivae and EOM are normal. No scleral icterus.  Neck: Normal  range of motion. Neck supple. No JVD present. Carotid bruit is not present. No thyromegaly present.  Cardiovascular: Normal rate, regular rhythm, normal heart sounds and intact distal pulses. Exam reveals no gallop.  Pulmonary/Chest: Effort normal and breath sounds normal. No respiratory distress. She has no wheezes. She exhibits no tenderness. No breast tenderness, discharge or bleeding.  Abdominal: Soft. Bowel sounds are normal. She exhibits no distension, no abdominal bruit and no mass. There is no tenderness.  Genitourinary: No breast tenderness, discharge or bleeding.  Genitourinary Comments: Breast exam: No mass, nodules, thickening, tenderness, bulging, retraction, inflamation, nipple discharge or skin changes noted.  No axillary or clavicular LA.      Musculoskeletal: Normal range of motion. She exhibits no edema, tenderness or deformity.  Pain with int/ext rot of R hip  Less in the L hip  Nl flexion of hips  Nl gait    Lymphadenopathy:    She has no cervical adenopathy.  Neurological: She is alert. She has normal reflexes. She displays normal reflexes. No cranial nerve deficit. She exhibits normal muscle tone. Coordination normal.  Skin: Skin is warm and dry. No rash noted. No erythema. No pallor.  Solar lentigines diffusely   Psychiatric: Her mood appears anxious. Her affect is not blunt and not labile.  Pleasant Very mildly anxious           Assessment & Plan:   Problem List Items Addressed This Visit      Cardiovascular and Mediastinum   Essential hypertension - Primary    bp in fair control at this time  BP Readings from Last 1 Encounters:  08/07/18 128/65   No changes needed Most recent labs reviewed  Disc lifstyle change with  low sodium diet and exercise        Relevant Medications   benazepril (LOTENSIN) 40 MG tablet   carvedilol (COREG) 25 MG tablet   hydrochlorothiazide (HYDRODIURIL) 25 MG tablet     Other   Hyperglycemia    Lab Results  Component  Value Date   HGBA1C 5.4 08/01/2018   Stable/well controlled  disc imp of low glycemic diet and wt loss to prevent DM2       Hyperlipidemia    Disc goals for lipids and reasons to control them Rev last labs with pt Rev low sat fat diet in detail Excellent HDL      Relevant Medications   benazepril (LOTENSIN) 40 MG tablet   carvedilol (COREG) 25 MG tablet   hydrochlorothiazide (HYDRODIURIL) 25 MG tablet   Leg pain    Disc poss of hip OA (pain starts in the groin)  Also poss knee OA  Very inflexible on exam - especially hamstrings  Recommend stretching program /or yoga If no imp -would do imaging       Malignant neoplasm of lower-inner quadrant of right breast of female, estrogen receptor positive ([place])    Doing well s/p mastectomy  femara year 4 of 5-tolerating well  Keeping up with bone density screening No vaginal bleeding or gyn problems  Continue oncology and surgical f/u

## 2018-08-07 NOTE — Patient Instructions (Signed)
You need to exercise  Start with stretching - think about a beginning yoga dvd and a mat  Hamstrings are tight -this can can worsen leg and knee pain   Do exercise that does not hurt   If no improvement - we will get some xrays   Labs are stable   Try and eat a healthy diet   Get bone density and mammogram as planned

## 2018-09-11 ENCOUNTER — Ambulatory Visit
Admission: RE | Admit: 2018-09-11 | Discharge: 2018-09-11 | Disposition: A | Discharge status: Home / Self Care | Payer: Medicare Other | Source: Ambulatory Visit | Attending: Oncology | Admitting: Oncology

## 2018-09-11 DIAGNOSIS — Z853 Personal history of malignant neoplasm of breast: Secondary | ICD-10-CM | POA: Diagnosis not present

## 2018-09-11 DIAGNOSIS — C50311 Malignant neoplasm of lower-inner quadrant of right female breast: Secondary | ICD-10-CM

## 2018-09-11 DIAGNOSIS — Z1231 Encounter for screening mammogram for malignant neoplasm of breast: Secondary | ICD-10-CM | POA: Diagnosis not present

## 2018-09-11 DIAGNOSIS — Z170 Estrogen receptor positive status [ER+]: Secondary | ICD-10-CM

## 2018-10-19 ENCOUNTER — Other Ambulatory Visit: Payer: Self-pay | Admitting: *Deleted

## 2018-10-19 MED ORDER — CLONAZEPAM 1 MG PO TABS: 1.0000 mg | ORAL_TABLET | Freq: Three times a day (TID) | ORAL | 3 refills | Status: DC | PRN

## 2018-10-19 NOTE — Telephone Encounter (Signed)
Name of Medication: [place] Name of Pharmacy: [facility] [address] [address]. Last [place] at [place] or Written Date and Quantity:  04/06/18 #90 tab with 3 refills  Next Office Visit and Type: CPE on 08/14/19 Last Controlled Substance Agreement Date: never done

## 2018-12-27 ENCOUNTER — Ambulatory Visit
Admission: RE | Admit: 2018-12-27 | Discharge: 2018-12-27 | Disposition: A | Discharge status: Home / Self Care | Payer: Medicare Other | Source: Ambulatory Visit | Attending: Oncology | Admitting: Oncology

## 2018-12-27 DIAGNOSIS — C50311 Malignant neoplasm of lower-inner quadrant of right female breast: Secondary | ICD-10-CM | POA: Diagnosis not present

## 2018-12-27 DIAGNOSIS — Z170 Estrogen receptor positive status [ER+]: Secondary | ICD-10-CM | POA: Diagnosis not present

## 2018-12-27 DIAGNOSIS — M810 Age-related osteoporosis without current pathological fracture: Secondary | ICD-10-CM | POA: Diagnosis not present

## 2019-01-04 ENCOUNTER — Telehealth: Payer: Self-pay | Admitting: *Deleted

## 2019-01-04 ENCOUNTER — Other Ambulatory Visit: Payer: Self-pay | Admitting: *Deleted

## 2019-01-04 MED ORDER — TAMOXIFEN CITRATE 20 MG PO TABS: 20.0000 mg | ORAL_TABLET | Freq: Every day | ORAL | 6 refills | Status: DC

## 2019-01-04 NOTE — Telephone Encounter (Signed)
Patient notified of results for bone density scan. Patient to stop letrozole and start on Tamoxifen per Dr. Grayland Ormond. It was also recommended that patient start Fosamax but patient cannot tolerate drug. Patient agrees to start prolia injections every 6 months. She will get her first injection with scheduled appointment on 3/19.

## 2019-01-14 ENCOUNTER — Other Ambulatory Visit: Payer: Self-pay | Admitting: *Deleted

## 2019-01-14 DIAGNOSIS — Z170 Estrogen receptor positive status [ER+]: Principal | ICD-10-CM

## 2019-01-14 DIAGNOSIS — C50311 Malignant neoplasm of lower-inner quadrant of right female breast: Secondary | ICD-10-CM

## 2019-01-14 NOTE — Progress Notes (Signed)
m °

## 2019-01-15 ENCOUNTER — Other Ambulatory Visit: Payer: Self-pay | Admitting: Oncology

## 2019-01-15 DIAGNOSIS — M810 Age-related osteoporosis without current pathological fracture: Secondary | ICD-10-CM

## 2019-01-16 ENCOUNTER — Ambulatory Visit: Payer: PRIVATE HEALTH INSURANCE | Admitting: Oncology

## 2019-01-16 NOTE — Progress Notes (Signed)
[place]  [phone]  Fax:(336) [phone]     DOHA BOLING DOB: November 11, 1945  MR#: [id number]  GXQ#:[id number]  Patient Care Team: Abner Greenspan, MD as PCP - General Byrnett, Forest Gleason, MD (General Surgery) Tower, Wynelle Fanny, MD as Consulting Physician (Family Medicine) Forest Gleason, MD (Inactive) as Consulting Physician (Oncology)  CHIEF COMPLAINT: Pathologic stage Ia adenocarcinoma of the lower inner quadrant of the right breast.  INTERVAL HISTORY: Patient returns to clinic today for routine 61-monthevaluation and initiation of Prolia.  Letrozole was discontinued secondary to osteoporosis and she is now tolerating tamoxifen without significant side effects. She has no neurologic complaints. She denies any recent fevers or illnesses. She has no chest pain or shortness of breath. She denies any nausea, vomiting, constipation, or diarrhea. She has no urinary complaints.  Patient offers no specific complaints today.  REVIEW OF SYSTEMS:   Review of Systems  Constitutional: Negative.  Negative for fever, malaise/fatigue and weight loss.  Respiratory: Negative.  Negative for cough and shortness of breath.   Cardiovascular: Negative.  Negative for chest pain and leg swelling.  Gastrointestinal: Negative.  Negative for abdominal pain.  Genitourinary: Negative.  Negative for dysuria.  Musculoskeletal: Negative.  Negative for back pain.  Skin: Negative.  Negative for rash.  Neurological: Negative.  Negative for dizziness, sensory change, focal weakness, weakness and headaches.  Psychiatric/Behavioral: Negative.  The patient is not nervous/anxious.     As per HPI. Otherwise, a complete review of systems is negative.  ONCOLOGY HISTORY: Oncology History   Carcinoma of breast at 4:00 position at the right breast status post simple mastectomy and anterior lymph node evaluation  Right simple mastectomy done on September 04, 2014 T1c N0 M0 tumor. estrogen receptor  positive progesterone receptor positive HER-2 receptor not overexpressed.  Oncotype DX score 22. 10 year distant recurrence rate 14% with tamoxifen alone.  Patient started on letrozole, 2015.     Malignant neoplasm of lower-inner quadrant of right breast of female, estrogen receptor positive ([place]   08/25/2014 Initial Diagnosis    Malignant [facility]oplasm of right breast ([facility]     PAST MEDICAL HISTORY: Past Medical History:  Diagnosis Date  . Allergic rhinitis   . Anxiety   . Breast cancer [place]   RT MASTECTOMY  . Breast cancer of lower-inner quadrant of right female breast ([facility] November 2015   T1c, N0/ ER + ; PR+; Her 2 neu not overexpressed. Oncotype DX: 14 % recurrence risk w/ antiestrogen alone (low intermediate group).  . Burning sensation    chronic,  skin  . Chronic cervicitis    with squamous metaplasia  . History of ultrasound, pelvic 03/29/06   WNL  . Hyperlipidemia 2003  . Hypertension   . Menopause     PAST SURGICAL HISTORY: Past Surgical History:  Procedure Laterality Date  . BREAST BIOPSY Right 2015   POS  . BREAST SURGERY Right 09/04/14   Mastectomy   . COLONOSCOPY  3/02   polyps  . COLONOSCOPY  05-26-15   Dr KDeatra Ina 3 mm tubular adenoma in the sigmoid. Repeat exam 2021.  .Marland KitchenDILATION AND CURETTAGE OF UTERUS  1987   for DQB  . MASTECTOMY Right 08/2014   BREAST CA  . OTHER SURGICAL HISTORY     Neg renal ultrasound    FAMILY HISTORY Family History  Problem Relation Age of Onset  . Diabetes Father   . Hypertension Father   . Heart failure Father   . Coronary  artery disease Father   . Hypertension Mother   . Colon cancer Neg Hx   . Colon polyps Neg Hx   . Pancreatic cancer Neg Hx   . Rectal cancer Neg Hx   . Stomach cancer Neg Hx   . Ulcerative colitis Neg Hx   . Esophageal cancer Neg Hx   . Crohn's disease Neg Hx   . Breast cancer Neg Hx     GYNECOLOGIC HISTORY:  No LMP recorded. Patient is postmenopausal.     ADVANCED DIRECTIVES:     HEALTH MAINTENANCE: Social History   Tobacco Use  . Smoking status: Never Smoker  . Smokeless tobacco: Never Used  Substance Use Topics  . Alcohol use: No    Alcohol/week: 0.0 standard drinks  . Drug use: No     Colonoscopy:  PAP:  Bone density:  Mammogram:  Allergies  Allergen Reactions  . Alendronate Sodium     FOSAMAX REACTION: body pain- severe all over  . Simvastatin     Muscle pain     Current Outpatient Medications  Medication Sig Dispense Refill  . benazepril (LOTENSIN) 40 MG tablet Take 1 tablet (40 mg total) by mouth daily. 90 tablet 3  . Calcium Carbonate-Vitamin D 600-400 MG-UNIT per tablet Take 2 tablets by mouth daily.      . carvedilol (COREG) 25 MG tablet Take 1 tablet (25 mg total) by mouth 2 (two) times daily. 180 tablet 3  . clonazePAM (KLONOPIN) 1 MG tablet Take 1 tablet (1 mg total) by mouth 3 (three) times daily as needed for anxiety. 90 tablet 3  . hydrochlorothiazide (HYDRODIURIL) 25 MG tablet Take 1 tablet (25 mg total) by mouth daily. 90 tablet 3  . mirtazapine (REMERON) 15 MG tablet Take 1 tablet (15 mg total) by mouth at bedtime. 90 tablet 3  . Multiple Vitamin (MULTIVITAMIN) tablet Take 1 tablet by mouth daily.    . Potassium 95 MG TABS Take by mouth daily.    . tamoxifen (NOLVADEX) 20 MG tablet Take 1 tablet (20 mg total) by mouth daily. 30 tablet 6  . B Complex Vitamins (VITAMIN B COMPLEX PO) Take by mouth daily.     No current facility-administered medications for this visit.    Facility-Administered Medications Ordered in Other Visits  Medication Dose Route Frequency Provider Last Rate Last Dose  . denosumab (PROLIA) injection 60 mg  60 mg Subcutaneous Once Lloyd Huger, MD        OBJECTIVE: BP (!) 184/94   Pulse 81   Resp 18   Wt 131 lb 6.4 oz (59.6 kg)   BMI 23.65 kg/m    Body mass index is 23.65 kg/m.    ECOG FS:0 - Asymptomatic  General: Well-developed, well-nourished, no acute distress. Eyes: Pink conjunctiva,  anicteric sclera. HEENT: Normocephalic, moist mucous membranes. Breast: Right chest wall without evidence of recurrence.  Left breast and axilla without lumps or masses. Lungs: Clear to auscultation bilaterally. Heart: Regular rate and rhythm. No rubs, murmurs, or gallops. Abdomen: Soft, nontender, nondistended. No organomegaly noted, normoactive bowel sounds. Musculoskeletal: No edema, cyanosis, or clubbing. Neuro: Alert, answering all questions appropriately. Cranial nerves grossly intact. Skin: No rashes or petechiae noted. Psych: Normal affect.   LAB RESULTS:  Appointment on 01/17/2019  Component Date Value Ref Range Status  . Sodium 01/17/2019 140  135 - 145 mmol/L Final  . Potassium 01/17/2019 3.6  3.5 - 5.1 mmol/L Final  . Chloride 01/17/2019 102  98 - 111 mmol/L Final  .  CO2 01/17/2019 29  22 - 32 mmol/L Final  . Glucose, Bld 01/17/2019 170* 70 - 99 mg/dL Final  . BUN 01/17/2019 19  8 - 23 mg/dL Final  . Creatinine, Ser 01/17/2019 0.84  0.44 - 1.00 mg/dL Final  . Calcium 01/17/2019 9.6  8.9 - 10.3 mg/dL Final  . GFR calc non Af Amer 01/17/2019 >60  >60 mL/min Final  . GFR calc Af Amer 01/17/2019 >60  >60 mL/min Final  . Anion gap 01/17/2019 9  5 - 15 Final   Performed at [facility], [address] [place], [place] [address]    STUDIES: No results found.  ASSESSMENT: Pathologic stage Ia adenocarcinoma of the lower inner quadrant of the right breast.  PLAN:    1. Pathologic stage Ia adenocarcinoma of the lower inner quadrant of the right breast: Patient is status post right mastectomy in November 2015. Her Oncotype score of 22 was intermediate risk, but patient did not receive chemotherapy.  Letrozole has been discontinued secondary to osteoporosis and patient was given a prescription for tamoxifen.  She will complete 5 years of treatment in December 2020, but given her intermediate risk Oncotype score, we will further discuss extending treatment 7 to 10 years.   Her most recent mammogram in November 2019 was reported as BI-RADS 1.  Repeat in November 2020.  Return to clinic in 6 months for routine evaluation. 2.  Osteoporosis: Patient's bone mineral density on December 27, 2018 reported T score of -2.9.  This is significantly worse than 2 years prior when her T score was only -1.2.  Patient reports she cannot tolerate Fosamax, therefore she will initiate Prolia every 6 months today.  Continue calcium and vitamin D supplementation.  Repeat bone mineral density in February 2021.  Return to clinic in 6 months as above for further evaluation and continuation of Prolia.  I spent a total of 30 minutes face-to-face with the patient of which greater than 50% of the visit was spent in counseling and coordination of care as detailed above.   Patient expressed understanding and was in agreement with this plan. She also understands that She can call clinic at any time with any questions, concerns, or complaints.     Lloyd Huger, MD   01/17/2019 11:46 AM

## 2019-01-17 ENCOUNTER — Inpatient Hospital Stay: Payer: Medicare Other

## 2019-01-17 ENCOUNTER — Encounter: Payer: Self-pay | Admitting: Oncology

## 2019-01-17 ENCOUNTER — Other Ambulatory Visit: Payer: Self-pay

## 2019-01-17 ENCOUNTER — Inpatient Hospital Stay: Payer: Medicare Other | Attending: Oncology | Admitting: Oncology

## 2019-01-17 VITALS — BP 184/94 | HR 81 | Resp 18 | Wt 131.4 lb

## 2019-01-17 DIAGNOSIS — C50311 Malignant neoplasm of lower-inner quadrant of right female breast: Secondary | ICD-10-CM

## 2019-01-17 DIAGNOSIS — Z170 Estrogen receptor positive status [ER+]: Secondary | ICD-10-CM | POA: Insufficient documentation

## 2019-01-17 DIAGNOSIS — Z79811 Long term (current) use of aromatase inhibitors: Secondary | ICD-10-CM

## 2019-01-17 DIAGNOSIS — M810 Age-related osteoporosis without current pathological fracture: Secondary | ICD-10-CM

## 2019-01-17 LAB — BASIC METABOLIC PANEL
Anion gap: 9 (ref 5–15)
BUN: 19 mg/dL (ref 8–23)
CO2: 29 mmol/L (ref 22–32)
Calcium: 9.6 mg/dL (ref 8.9–10.3)
Chloride: 102 mmol/L (ref 98–111)
Creatinine, Ser: 0.84 mg/dL (ref 0.44–1.00)
GFR calc Af Amer: >60 mL/min (ref >60)
Glucose, Bld: 170 mg/dL — ABNORMAL HIGH (ref 70–99)
Potassium: 3.6 mmol/L (ref 3.5–5.1)
Sodium: 140 mmol/L (ref 135–145)

## 2019-01-17 MED ORDER — DENOSUMAB 60 MG/ML ~~LOC~~ SOSY
60.0000 mg | PREFILLED_SYRINGE | Freq: Once | SUBCUTANEOUS | Status: AC
  Administered 2019-01-17: 60 mg via SUBCUTANEOUS
  Filled 2019-01-17: qty 1

## 2019-01-17 NOTE — Progress Notes (Signed)
Patient reports fatigue and pain and aches in legs.

## 2019-04-30 ENCOUNTER — Ambulatory Visit: Payer: Medicare Other | Admitting: General Surgery

## 2019-05-29 ENCOUNTER — Other Ambulatory Visit: Payer: Self-pay | Admitting: Family Medicine

## 2019-05-29 NOTE — Telephone Encounter (Signed)
Name of Medication: [place] Name of Pharmacy: [facility] [address] or Written Date and Quantity: 10/19/18 #90 tabs with 3 refills Last Office Visit and Type: CPE on 08/07/18 Next Office Visit and Type: 08/14/19 CPE

## 2019-05-30 ENCOUNTER — Other Ambulatory Visit: Payer: Self-pay | Admitting: Oncology

## 2019-06-30 ENCOUNTER — Other Ambulatory Visit: Payer: Self-pay | Admitting: Oncology

## 2019-07-18 NOTE — Progress Notes (Signed)
[place]  Telephone:(336) [phone]  Fax:(336) [phone]     Shannon Stephens DOB: [birth year]  MR#: [id number]  XYI#:[id number]  Patient Care Team: Abner Greenspan, MD as PCP - General Byrnett, Forest Gleason, MD (General Surgery) Tower, Wynelle Fanny, MD as Consulting Physician (Family Medicine) Forest Gleason, MD (Inactive) as Consulting Physician (Oncology)  CHIEF COMPLAINT: Pathologic stage Ia adenocarcinoma of the lower inner quadrant of the right breast.  INTERVAL HISTORY: Patient returns to clinic today for routine 35-monthevaluation and continuation of Prolia.  She is tolerating tamoxifen without significant side effects.  She currently feels well and is asymptomatic.  She has no neurologic complaints. She denies any recent fevers or illnesses.  She denies any chest pain, shortness of breath, cough, or hemoptysis.  She denies any nausea, vomiting, constipation, or diarrhea. She has no urinary complaints.  Patient offers no specific complaints today.  REVIEW OF SYSTEMS:   Review of Systems  Constitutional: Negative.  Negative for fever, malaise/fatigue and weight loss.  Respiratory: Negative.  Negative for cough and shortness of breath.   Cardiovascular: Negative.  Negative for chest pain and leg swelling.  Gastrointestinal: Negative.  Negative for abdominal pain.  Genitourinary: Negative.  Negative for dysuria.  Musculoskeletal: Negative.  Negative for back pain.  Skin: Negative.  Negative for rash.  Neurological: Negative.  Negative for dizziness, sensory change, focal weakness, weakness and headaches.  Psychiatric/Behavioral: Negative.  The patient is not nervous/anxious.     As per HPI. Otherwise, a complete review of systems is negative.  ONCOLOGY HISTORY: Oncology History Overview Note  Carcinoma of breast at 4:00 position at the right breast status post simple mastectomy and anterior lymph node evaluation  Right simple mastectomy done on September 04, 2014 T1c N0 M0  tumor. estrogen receptor positive progesterone receptor positive HER-2 receptor not overexpressed.  Oncotype DX score 22. 10 year distant recurrence rate 14% with tamoxifen alone.  Patient started on letrozole, 2015.   Malignant neoplasm of lower-inner quadrant of right breast of female, estrogen receptor positive ([facility]  08/25/2014 Initial Diagnosis   Malignant [facility]oplasm of right breast ([facility]     PAST MEDICAL HISTORY: Past Medical History:  Diagnosis Date  . Allergic rhinitis   . Anxiety   . Breast cancer ([place]   RT MASTECTOMY  . Breast cancer of lower-inner quadrant of right female breast ([facility] November 2015   T1c, N0/ ER + ; PR+; Her 2 neu not overexpressed. Oncotype DX: 14 % recurrence risk w/ antiestrogen alone (low intermediate group).  . Burning sensation    chronic,  skin  . Chronic cervicitis    with squamous metaplasia  . History of ultrasound, pelvic 03/29/06   WNL  . Hyperlipidemia 2003  . Hypertension   . Menopause     PAST SURGICAL HISTORY: Past Surgical History:  Procedure Laterality Date  . BREAST BIOPSY Right 2015   POS  . BREAST SURGERY Right 09/04/14   Mastectomy   . COLONOSCOPY  3/02   polyps  . COLONOSCOPY  05-26-15   Dr KDeatra Ina 3 mm tubular adenoma in the sigmoid. Repeat exam 2021.  .Marland KitchenDILATION AND CURETTAGE OF UTERUS  1987   for DQB  . MASTECTOMY Right 08/2014   BREAST CA  . OTHER SURGICAL HISTORY     Neg renal ultrasound    FAMILY HISTORY Family History  Problem Relation Age of Onset  . Diabetes Father   . Hypertension Father   . Heart failure Father   .  Coronary artery disease Father   . Hypertension Mother   . Colon cancer Neg Hx   . Colon polyps Neg Hx   . Pancreatic cancer Neg Hx   . Rectal cancer Neg Hx   . Stomach cancer Neg Hx   . Ulcerative colitis Neg Hx   . Esophageal cancer Neg Hx   . Crohn's disease Neg Hx   . Breast cancer Neg Hx     GYNECOLOGIC HISTORY:  No LMP recorded. Patient is postmenopausal.      ADVANCED DIRECTIVES:    HEALTH MAINTENANCE: Social History   Tobacco Use  . Smoking status: Never Smoker  . Smokeless tobacco: Never Used  Substance Use Topics  . Alcohol use: No    Alcohol/week: 0.0 standard drinks  . Drug use: No     Colonoscopy:  PAP:  Bone density:  Mammogram:  Allergies  Allergen Reactions  . Alendronate Sodium     FOSAMAX REACTION: body pain- severe all over  . Simvastatin     Muscle pain     Current Outpatient Medications  Medication Sig Dispense Refill  . B Complex Vitamins (VITAMIN B COMPLEX PO) Take by mouth daily.    . Calcium Carbonate-Vitamin D 600-400 MG-UNIT per tablet Take 2 tablets by mouth daily.      . carvedilol (COREG) 25 MG tablet Take 1 tablet (25 mg total) by mouth 2 (two) times daily. 180 tablet 3  . clonazePAM (KLONOPIN) 1 MG tablet TAKE 1 TABLET (1 MG TOTAL) BY MOUTH 3 (THREE) TIMES DAILY AS NEEDED FOR ANXIETY. 90 tablet 0  . mirtazapine (REMERON) 15 MG tablet Take 1 tablet (15 mg total) by mouth at bedtime. 90 tablet 3  . Multiple Vitamin (MULTIVITAMIN) tablet Take 1 tablet by mouth daily.    . Potassium 95 MG TABS Take by mouth daily.    . tamoxifen (NOLVADEX) 20 MG tablet TAKE 1 TABLET BY MOUTH EVERY DAY 90 tablet 2  . benazepril (LOTENSIN) 40 MG tablet Take 1 tablet (40 mg total) by mouth daily. (Patient not taking: Reported on 07/22/2019) 90 tablet 3  . hydrochlorothiazide (HYDRODIURIL) 25 MG tablet Take 1 tablet (25 mg total) by mouth daily. (Patient not taking: Reported on 07/22/2019) 90 tablet 3   No current facility-administered medications for this visit.     OBJECTIVE: BP (!) 188/87 (BP Location: Left Arm, Patient Position: Sitting, Cuff Size: Normal)   Pulse 63   Temp 97.6 F (36.4 C) (Tympanic)   Resp 16   Wt 124 lb (56.2 kg)   BMI 22.32 kg/m    Body mass index is 22.32 kg/m.    ECOG FS:0 - Asymptomatic  General: Well-developed, well-nourished, no acute distress. Eyes: Pink conjunctiva, anicteric sclera.  HEENT: Normocephalic, moist mucous membranes. Breast: Patient requested exam be deferred today. Lungs: Clear to auscultation bilaterally. Heart: Regular rate and rhythm. No rubs, murmurs, or gallops. Abdomen: Soft, nontender, nondistended. No organomegaly noted, normoactive bowel sounds. Musculoskeletal: No edema, cyanosis, or clubbing. Neuro: Alert, answering all questions appropriately. Cranial nerves grossly intact. Skin: No rashes or petechiae noted. Psych: Normal affect.   LAB RESULTS:  Appointment on 07/22/2019  Component Date Value Ref Range Status  . Sodium 07/22/2019 141  135 - 145 mmol/L Final  . Potassium 07/22/2019 3.8  3.5 - 5.1 mmol/L Final  . Chloride 07/22/2019 104  98 - 111 mmol/L Final  . CO2 07/22/2019 30  22 - 32 mmol/L Final  . Glucose, Bld 07/22/2019 123* 70 - 99 mg/dL Final  .  BUN 07/22/2019 21  8 - 23 mg/dL Final  . Creatinine, Ser 07/22/2019 0.85  0.44 - 1.00 mg/dL Final  . Calcium 07/22/2019 9.4  8.9 - 10.3 mg/dL Final  . GFR calc non Af Amer 07/22/2019 >60  >60 mL/min Final  . GFR calc Af Amer 07/22/2019 >60  >60 mL/min Final  . Anion gap 07/22/2019 7  5 - 15 Final   Performed at [facility], [address] [place], [place] [address]    STUDIES: No results found.  ASSESSMENT: Pathologic stage Ia adenocarcinoma of the lower inner quadrant of the right breast.  PLAN:    1. Pathologic stage Ia adenocarcinoma of the lower inner quadrant of the right breast: Patient is status post right mastectomy in November 2015. Her Oncotype score of 22 was intermediate risk, but patient did not receive chemotherapy.  Letrozole has been discontinued secondary to osteoporosis and patient was given a prescription for tamoxifen.  She will complete 5 years of treatment in December 2020, but given her intermediate risk Oncotype score, I recommended patient continue treatment for 2-5 more years.  Her most recent mammogram in November 2019 was reported as BI-RADS 1.   Repeat in November 2020.  Return to clinic in 6 months for routine evaluation. 2.  Osteoporosis: Patient's bone mineral density on December 27, 2018 reported T score of -2.9.  This is significantly worse than 2 years prior when her T score was only -1.2.  Patient reports she cannot tolerate Fosamax.  Proceed with Prolia today.  Continue calcium and vitamin D supplementation.  Repeat bone mineral density in February 2021.  Return to clinic in 6 months as above for continuation of treatment.  I spent a total of 30 minutes face-to-face with the patient of which greater than 50% of the visit was spent in counseling and coordination of care as detailed above.   Patient expressed understanding and was in agreement with this plan. She also understands that She can call clinic at any time with any questions, concerns, or complaints.     Lloyd Huger, MD   07/23/2019 6:56 AM

## 2019-07-22 ENCOUNTER — Inpatient Hospital Stay: Payer: Medicare Other | Attending: Oncology

## 2019-07-22 ENCOUNTER — Inpatient Hospital Stay (HOSPITAL_BASED_OUTPATIENT_CLINIC_OR_DEPARTMENT_OTHER): Payer: Medicare Other | Admitting: Oncology

## 2019-07-22 ENCOUNTER — Other Ambulatory Visit: Payer: Self-pay

## 2019-07-22 ENCOUNTER — Inpatient Hospital Stay: Payer: Medicare Other

## 2019-07-22 ENCOUNTER — Encounter: Payer: Self-pay | Admitting: Oncology

## 2019-07-22 VITALS — BP 188/87 | HR 63 | Temp 97.6°F | Resp 16 | Wt 124.0 lb

## 2019-07-22 DIAGNOSIS — M810 Age-related osteoporosis without current pathological fracture: Secondary | ICD-10-CM

## 2019-07-22 DIAGNOSIS — Z79810 Long term (current) use of selective estrogen receptor modulators (SERMs): Secondary | ICD-10-CM | POA: Diagnosis not present

## 2019-07-22 DIAGNOSIS — C50311 Malignant neoplasm of lower-inner quadrant of right female breast: Secondary | ICD-10-CM

## 2019-07-22 DIAGNOSIS — I10 Essential (primary) hypertension: Secondary | ICD-10-CM | POA: Insufficient documentation

## 2019-07-22 DIAGNOSIS — Z79811 Long term (current) use of aromatase inhibitors: Secondary | ICD-10-CM | POA: Diagnosis not present

## 2019-07-22 DIAGNOSIS — Z170 Estrogen receptor positive status [ER+]: Secondary | ICD-10-CM

## 2019-07-22 DIAGNOSIS — F419 Anxiety disorder, unspecified: Secondary | ICD-10-CM | POA: Diagnosis not present

## 2019-07-22 DIAGNOSIS — Z79899 Other long term (current) drug therapy: Secondary | ICD-10-CM | POA: Diagnosis not present

## 2019-07-22 DIAGNOSIS — E785 Hyperlipidemia, unspecified: Secondary | ICD-10-CM | POA: Insufficient documentation

## 2019-07-22 DIAGNOSIS — Z9011 Acquired absence of right breast and nipple: Secondary | ICD-10-CM | POA: Diagnosis not present

## 2019-07-22 LAB — BASIC METABOLIC PANEL
Anion gap: 7 (ref 5–15)
BUN: 21 mg/dL (ref 8–23)
CO2: 30 mmol/L (ref 22–32)
Calcium: 9.4 mg/dL (ref 8.9–10.3)
Chloride: 104 mmol/L (ref 98–111)
Creatinine, Ser: 0.85 mg/dL (ref 0.44–1.00)
GFR calc Af Amer: >60 mL/min (ref >60)
GFR calc non Af Amer: >60 mL/min (ref >60)
Glucose, Bld: 123 mg/dL — ABNORMAL HIGH (ref 70–99)
Potassium: 3.8 mmol/L (ref 3.5–5.1)
Sodium: 141 mmol/L (ref 135–145)

## 2019-07-22 MED ORDER — DENOSUMAB 60 MG/ML ~~LOC~~ SOSY
60.0000 mg | PREFILLED_SYRINGE | Freq: Once | SUBCUTANEOUS | Status: AC
  Administered 2019-07-22: 12:00:00 60 mg via SUBCUTANEOUS
  Filled 2019-07-22: qty 1

## 2019-07-23 ENCOUNTER — Other Ambulatory Visit: Payer: Self-pay | Admitting: Family Medicine

## 2019-07-24 NOTE — Telephone Encounter (Signed)
Name of Medication: [place] Name of [facility] [address] or Written Date and Quantity: 05/29/19 #90 tabs with 0 refills Last Office Visit and Type: CPE on 08/07/18 Next Office Visit and Type: 08/14/19 CPE

## 2019-08-06 ENCOUNTER — Telehealth: Payer: Self-pay | Admitting: Family Medicine

## 2019-08-06 DIAGNOSIS — R7303 Prediabetes: Secondary | ICD-10-CM

## 2019-08-06 DIAGNOSIS — I10 Essential (primary) hypertension: Secondary | ICD-10-CM

## 2019-08-06 DIAGNOSIS — M810 Age-related osteoporosis without current pathological fracture: Secondary | ICD-10-CM

## 2019-08-06 DIAGNOSIS — E7800 Pure hypercholesterolemia, unspecified: Secondary | ICD-10-CM

## 2019-08-06 NOTE — Telephone Encounter (Signed)
-----   Message from Ellamae Sia sent at 08/02/2019  9:51 AM EDT ----- Regarding: Lab orders for Wednesday, 10.7.20 Patient is scheduled for CPX labs, please order future labs, Thanks , Karna Christmas

## 2019-08-07 ENCOUNTER — Other Ambulatory Visit (INDEPENDENT_AMBULATORY_CARE_PROVIDER_SITE_OTHER): Payer: Medicare Other

## 2019-08-07 ENCOUNTER — Ambulatory Visit: Payer: PRIVATE HEALTH INSURANCE

## 2019-08-07 DIAGNOSIS — R7303 Prediabetes: Secondary | ICD-10-CM | POA: Diagnosis not present

## 2019-08-07 DIAGNOSIS — I10 Essential (primary) hypertension: Secondary | ICD-10-CM

## 2019-08-07 DIAGNOSIS — M810 Age-related osteoporosis without current pathological fracture: Secondary | ICD-10-CM | POA: Diagnosis not present

## 2019-08-07 DIAGNOSIS — E7800 Pure hypercholesterolemia, unspecified: Secondary | ICD-10-CM | POA: Diagnosis not present

## 2019-08-07 LAB — CBC WITH DIFFERENTIAL/PLATELET
Basophils Absolute: 0 10*3/uL (ref 0.0–0.1)
Basophils Relative: 0.8 % (ref 0.0–3.0)
Eosinophils Absolute: 0.1 10*3/uL (ref 0.0–0.7)
Eosinophils Relative: 2.6 % (ref 0.0–5.0)
HCT: 42.1 % (ref 36.0–46.0)
Hemoglobin: 14 g/dL (ref 12.0–15.0)
Lymphocytes Relative: 29.5 % (ref 12.0–46.0)
Lymphs Abs: 1.5 10*3/uL (ref 0.7–4.0)
MCHC: 33.2 g/dL (ref 30.0–36.0)
MCV: 102.2 fl — ABNORMAL HIGH (ref 78.0–100.0)
Monocytes Absolute: 0.4 10*3/uL (ref 0.1–1.0)
Monocytes Relative: 8.5 % (ref 3.0–12.0)
Neutro Abs: 3 10*3/uL (ref 1.4–7.7)
Neutrophils Relative %: 58.6 % (ref 43.0–77.0)
Platelets: 219 10*3/uL (ref 150.0–400.0)
RBC: 4.12 Mil/uL (ref 3.87–5.11)
RDW: 12.8 % (ref 11.5–15.5)
WBC: 5.1 10*3/uL (ref 4.0–10.5)

## 2019-08-07 LAB — LIPID PANEL
Cholesterol: 211 mg/dL — ABNORMAL HIGH (ref 0–200)
HDL: 56.8 mg/dL (ref >39.00)
NonHDL: 154.48
Total CHOL/HDL Ratio: 4
Triglycerides: 218 mg/dL — ABNORMAL HIGH (ref 0.0–149.0)
VLDL: 43.6 mg/dL — ABNORMAL HIGH (ref 0.0–40.0)

## 2019-08-07 LAB — COMPREHENSIVE METABOLIC PANEL
ALT: 24 U/L (ref 0–35)
AST: 28 U/L (ref 0–37)
Albumin: 4.5 g/dL (ref 3.5–5.2)
Alkaline Phosphatase: 38 U/L — ABNORMAL LOW (ref 39–117)
BUN: 24 mg/dL — ABNORMAL HIGH (ref 6–23)
CO2: 26 mEq/L (ref 19–32)
Calcium: 9.5 mg/dL (ref 8.4–10.5)
Chloride: 104 mEq/L (ref 96–112)
Creatinine, Ser: 0.82 mg/dL (ref 0.40–1.20)
GFR: 68.35 mL/min (ref >60.00)
Glucose, Bld: 127 mg/dL — ABNORMAL HIGH (ref 70–99)
Potassium: 3.7 mEq/L (ref 3.5–5.1)
Sodium: 140 mEq/L (ref 135–145)
Total Bilirubin: 0.5 mg/dL (ref 0.2–1.2)
Total Protein: 7.5 g/dL (ref 6.0–8.3)

## 2019-08-07 LAB — TSH: TSH: 2.04 u[IU]/mL (ref 0.35–4.50)

## 2019-08-07 LAB — LDL CHOLESTEROL, DIRECT: Direct LDL: 135 mg/dL

## 2019-08-07 LAB — HEMOGLOBIN A1C: Hgb A1c MFr Bld: 5.3 % (ref 4.6–6.5)

## 2019-08-07 LAB — VITAMIN D 25 HYDROXY (VIT D DEFICIENCY, FRACTURES): VITD: 83.55 ng/mL (ref 30.00–100.00)

## 2019-08-14 ENCOUNTER — Other Ambulatory Visit: Payer: Self-pay

## 2019-08-14 ENCOUNTER — Encounter: Payer: Self-pay | Admitting: Family Medicine

## 2019-08-14 ENCOUNTER — Ambulatory Visit (INDEPENDENT_AMBULATORY_CARE_PROVIDER_SITE_OTHER): Payer: Medicare Other | Admitting: Family Medicine

## 2019-08-14 VITALS — BP 158/92 | HR 80 | Temp 98.0°F | Ht 62.75 in | Wt 124.1 lb

## 2019-08-14 DIAGNOSIS — M79604 Pain in right leg: Secondary | ICD-10-CM | POA: Diagnosis not present

## 2019-08-14 DIAGNOSIS — Z23 Encounter for immunization: Secondary | ICD-10-CM

## 2019-08-14 DIAGNOSIS — F411 Generalized anxiety disorder: Secondary | ICD-10-CM

## 2019-08-14 DIAGNOSIS — I10 Essential (primary) hypertension: Secondary | ICD-10-CM | POA: Diagnosis not present

## 2019-08-14 DIAGNOSIS — M810 Age-related osteoporosis without current pathological fracture: Secondary | ICD-10-CM

## 2019-08-14 DIAGNOSIS — M79605 Pain in left leg: Secondary | ICD-10-CM

## 2019-08-14 DIAGNOSIS — Z0000 Encounter for general adult medical examination without abnormal findings: Secondary | ICD-10-CM

## 2019-08-14 DIAGNOSIS — Z170 Estrogen receptor positive status [ER+]: Secondary | ICD-10-CM | POA: Diagnosis not present

## 2019-08-14 DIAGNOSIS — R7303 Prediabetes: Secondary | ICD-10-CM

## 2019-08-14 DIAGNOSIS — E7800 Pure hypercholesterolemia, unspecified: Secondary | ICD-10-CM

## 2019-08-14 DIAGNOSIS — C50311 Malignant neoplasm of lower-inner quadrant of right female breast: Secondary | ICD-10-CM | POA: Diagnosis not present

## 2019-08-14 MED ORDER — CARVEDILOL 25 MG PO TABS: 25.0000 mg | ORAL_TABLET | Freq: Two times a day (BID) | ORAL | 3 refills | Status: DC

## 2019-08-14 NOTE — Assessment & Plan Note (Signed)
dexa 2/20 On tamoxifen  Intol of bisphosphenate in the past (per oncology) Now on prolia and tolerating well No new fractures  One fall from climbing (disc fall prev) Takes ca and D twice daily  Level of D is therapeutic at 57

## 2019-08-14 NOTE — Assessment & Plan Note (Signed)
Stable with mirtazapine and klonopin Reviewed stressors/ coping techniques/symptoms/ support sources/ tx options and side effects in detail today

## 2019-08-14 NOTE — Assessment & Plan Note (Signed)
Pt has significant white coat effect and states bp are much better at home  BP Readings from Last 1 Encounters:  08/14/19 (!) 158/92   No changes needed but did request she check 2 wk of readings (disc way to do properly) and send in Will adj tx if needed  Enc low sodium diet and exercise as tolerated Most recent labs reviewed  Disc lifstyle change with low sodium diet and exercise

## 2019-08-14 NOTE — Assessment & Plan Note (Signed)
Suspect hip OA causing groin pain with movement She will return for imaging in the future

## 2019-08-14 NOTE — Assessment & Plan Note (Signed)
S/p R mastectomy  Now on tamoxifen - ? 10 y  Watching bone density Continues oncology f/u

## 2019-08-14 NOTE — Patient Instructions (Addendum)
Get /find a video - chair exercise programs are excellent  Also upper body /weights or resistance bands   In the future we may want to get some hip xrays to see if hip arthritis may be source for groin pain   Please work on a power of attorney -get it signed  Or use our forms (blue packet)   Make sure to get an eye exam  You probably need an adjustment   Goal blood pressure is under 140 on top and 90 on the bottom  Always check BP when completely relaxed, arm at heart level and both feet on the floor  Please send [place] some readings in about 2 weeks    For cholesterol Avoid red meat/ fried foods/ egg yolks/ fatty breakfast meats/ butter, cheese and high fat dairy/ and shellfish    Flu shot today

## 2019-08-14 NOTE — Assessment & Plan Note (Signed)
Reviewed health habits including diet and exercise and skin cancer prevention Reviewed appropriate screening tests for age  Also reviewed health mt list, fam hx and immunization status , as well as social and family history   See HPI Labs reviewed  Pt has mammogram planned 09/16/19 and also oncology f/u for personal breast cancer hx Flu vaccine given today  dexa rev-taking prolia now  Fall prevention disc (one fall this year climbing)  Pt needs to update POA- materials given  No cognitive concerns Nl hearing screen  Vision screen is sub optimal- enc pt to get a routine eye exam  bp is high-inst pt to check at home and get back to [place]

## 2019-08-14 NOTE — Assessment & Plan Note (Signed)
Lab Results  Component Value Date   HGBA1C 5.3 08/07/2019   disc imp of low glycemic diet and wt loss to prevent DM2

## 2019-08-14 NOTE — Assessment & Plan Note (Signed)
Disc goals for lipids and reasons to control them Rev last labs with pt Rev low sat fat diet in detail  LDL is over 130  HDL down  Given info re: diet to consider  Continue to watch

## 2019-08-14 NOTE — Progress Notes (Signed)
Subjective:    Patient ID: Shannon Stephens, female    DOB: December 12, 1945, 73 y.o.   MRN: [id number]  HPI Here for amw and review of chronic medical problems   I have personally reviewed the Medicare Annual Wellness questionnaire and have noted 1. The patient's medical and social history 2. Their use of alcohol, tobacco or illicit drugs 3. Their current medications and supplements 4. The patient's functional ability including ADL's, fall risks, home safety risks and hearing or visual             impairment. 5. Diet and physical activities 6. Evidence for depression or mood disorders  The patients weight, height, BMI have been recorded in the chart and visual acuity is per eye clinic.  I have made referrals, counseling and provided education to the patient based review of the above and I have provided the pt with a written personalized care plan for preventive services. Reviewed and updated provider list, see scanned forms.  See scanned forms.  Routine anticipatory guidance given to patient.  See health maintenance. Colon cancer screening colonoscopy 7/16 with 5 y recall  Breast cancer screening 11/19 , is scheduled for 09/16/19 Personal hx breast cancer , had mastectomy and now taking tamoxifen Self breast exam Flu vaccine -given today Tetanus vaccine 12/13 Td Pneumovax completed Zoster vaccine -zostavax 10/16 Dexa 2/20- OP in L forearm Getting prolia from oncology D level is great at 8 Falls- she had a fall when she stood on kitchen chair-fell on behind  (no fx) Fractures- none  Supplements- she taked D and ca twice daily  Exercise - none due to pain in her groin/legs   Advance directive- has a living will (needs to get her POA signed)  Cognitive function addressed- see scanned forms- and if abnormal then additional documentation follows.  No big changes in memory or concentration  Not getting lost or confused   PMH and SH reviewed  Meds, vitals, and allergies reviewed.    ROS: See HPI.  Otherwise negative.    Being very careful during the pandemic   Has some groin pain -? Hip   More tired as she ages    Weight : Wt Readings from Last 3 Encounters:  08/14/19 124 lb 1 oz (56.3 kg)  07/22/19 124 lb (56.2 kg)  01/17/19 131 lb 6.4 oz (59.6 kg)  appetite goes down when she is tired  She takes boost every day and eats yogurt daily as well  22.15 kg/m   Hearing/vision:  Hearing Screening   125Hz  250Hz  500Hz  1000Hz  2000Hz  3000Hz  4000Hz  6000Hz  8000Hz   Right ear:   40 40 40  40    Left ear:   40 40 40  40      Visual Acuity Screening   Right eye Left eye Both eyes  Without correction:     With correction: 20/50 20/100 20/40   Per pt it is time to get her eyes examined   bp is stable today  White coat HTN 0000000 systolic at home - it is always high in the doctor office  No cp or palpitations or headaches or edema  No side effects to medicines  BP Readings from Last 3 Encounters:  08/14/19 (!) 172/102  07/22/19 (!) 188/87  01/17/19 (!) 184/94     Pulse Readings from Last 3 Encounters:  08/14/19 80  07/22/19 63  01/17/19 81     Anxiety-no changes  Nothing new  Takes mirtazapine and klonopin   Hyperlipidemia Lab  Results  Component Value Date   CHOL 211 (H) 08/07/2019   CHOL 237 (H) 08/01/2018   CHOL 272 (H) 07/26/2017   Lab Results  Component Value Date   HDL 56.80 08/07/2019   HDL 70.50 08/01/2018   HDL 62.80 07/26/2017   Lab Results  Component Value Date   LDLCALC 133 (H) 08/01/2018   LDLCALC 127 (H) 07/14/2016   LDLCALC 125 (H) 12/30/2014   Lab Results  Component Value Date   TRIG 218.0 (H) 08/07/2019   TRIG 166.0 (H) 08/01/2018   TRIG 297.0 (H) 07/26/2017   Lab Results  Component Value Date   CHOLHDL 4 08/07/2019   CHOLHDL 3 08/01/2018   CHOLHDL 4 07/26/2017   Lab Results  Component Value Date   LDLDIRECT 135.0 08/07/2019   LDLDIRECT 150.0 07/26/2017   LDLDIRECT 157.1 05/09/2011  she does not eat much fried  food  occ red meat once in a while  Does not eat southern breakfast often   Father died young of heart problems     Prediabetes Lab Results  Component Value Date   HGBA1C 5.3 08/07/2019   Lab Results  Component Value Date   WBC 5.1 08/07/2019   HGB 14.0 08/07/2019   HCT 42.1 08/07/2019   MCV 102.2 (H) 08/07/2019   PLT 219.0 08/07/2019    Drinks occ wine-not every day   Lab Results  Component Value Date   CREATININE 0.82 08/07/2019   BUN 24 (H) 08/07/2019   NA 140 08/07/2019   K 3.7 08/07/2019   CL 104 08/07/2019   CO2 26 08/07/2019   Lab Results  Component Value Date   ALT 24 08/07/2019   AST 28 08/07/2019   ALKPHOS 38 (L) 08/07/2019   BILITOT 0.5 08/07/2019    Lab Results  Component Value Date   TSH 2.04 08/07/2019    Patient Active Problem List   Diagnosis Date Noted  . Osteoporosis 01/15/2019  . Leg pain 08/07/2018  . Encounter for routine gynecological examination 12/30/2014  . Malignant neoplasm of lower-inner quadrant of right breast of female, estrogen receptor positive ([place]) 08/25/2014  . Encounter for Medicare annual wellness exam 12/27/2013  . Colon cancer screening 12/27/2013  . Abnormal EKG 01/17/2012  . Prediabetes 05/13/2011  . Macrocytosis 05/13/2011  . Gynecological examination 05/13/2011  . Routine general medical examination at a health care facility 05/08/2011  . [place] DISEASE, LUMBAR 02/28/2008  . LIVEDO RETICULARIS 02/28/2008  . Hyperlipidemia 01/12/2007  . Generalized anxiety disorder 01/12/2007  . Essential hypertension 01/12/2007  . ALLERGIC RHINITIS 01/12/2007  . SLEEP DISORDER 01/12/2007   Past Medical History:  Diagnosis Date  . Allergic rhinitis   . Anxiety   . Breast cancer ([place]) 2015   RT MASTECTOMY  . Breast cancer of lower-inner quadrant of right female breast [facility]) November 2015   T1c, N0/ ER + ; PR+; Her 2 neu not overexpressed. Oncotype DX: 14 % recurrence risk w/ antiestrogen alone (low intermediate group).  .  Burning sensation    chronic,  skin  . Chronic cervicitis    with squamous metaplasia  . History of ultrasound, pelvic 03/29/06   WNL  . Hyperlipidemia 2003  . Hypertension   . Menopause    Past Surgical History:  Procedure Laterality Date  . BREAST BIOPSY Right 2015   POS  . BREAST SURGERY Right 09/04/14   Mastectomy   . COLONOSCOPY  3/02   polyps  . COLONOSCOPY  05-26-15   Dr Deatra Ina, 3 mm tubular  adenoma in the sigmoid. Repeat exam 2021.  Marland Kitchen DILATION AND CURETTAGE OF UTERUS  1987   for DQB  . MASTECTOMY Right 08/2014   BREAST CA  . OTHER SURGICAL HISTORY     Neg renal ultrasound   Social History   Tobacco Use  . Smoking status: Never Smoker  . Smokeless tobacco: Never Used  Substance Use Topics  . Alcohol use: No    Alcohol/week: 0.0 standard drinks  . Drug use: No   Family History  Problem Relation Age of Onset  . Diabetes Father   . Hypertension Father   . Heart failure Father   . Coronary artery disease Father   . Hypertension Mother   . Colon cancer Neg Hx   . Colon polyps Neg Hx   . Pancreatic cancer Neg Hx   . Rectal cancer Neg Hx   . Stomach cancer Neg Hx   . Ulcerative colitis Neg Hx   . Esophageal cancer Neg Hx   . Crohn's disease Neg Hx   . Breast cancer Neg Hx    Allergies  Allergen Reactions  . Alendronate Sodium     FOSAMAX REACTION: body pain- severe all over  . Simvastatin     Muscle pain    Current Outpatient Medications on File Prior to Visit  Medication Sig Dispense Refill  . B Complex Vitamins (VITAMIN B COMPLEX PO) Take by mouth daily.    . Calcium Carbonate-Vitamin D 600-400 MG-UNIT per tablet Take 2 tablets by mouth daily.      . clonazePAM (KLONOPIN) 1 MG tablet TAKE 1 TABLET (1 MG TOTAL) BY MOUTH 3 (THREE) TIMES DAILY AS NEEDED FOR ANXIETY. 90 tablet 0  . mirtazapine (REMERON) 15 MG tablet Take 1 tablet (15 mg total) by mouth at bedtime. 90 tablet 3  . Multiple Vitamin (MULTIVITAMIN) tablet Take 1 tablet by mouth daily.    .  Potassium 95 MG TABS Take by mouth daily.    . tamoxifen (NOLVADEX) 20 MG tablet TAKE 1 TABLET BY MOUTH EVERY DAY 90 tablet 2   No current facility-administered medications on file prior to visit.      Review of Systems  Constitutional: Negative for activity change, appetite change, fatigue, fever and unexpected weight change.  HENT: Negative for congestion, ear pain, rhinorrhea, sinus pressure and sore throat.   Eyes: Negative for pain, redness and visual disturbance.  Respiratory: Negative for cough, shortness of breath and wheezing.   Cardiovascular: Negative for chest pain and palpitations.  Gastrointestinal: Negative for abdominal pain, blood in stool, constipation and diarrhea.  Endocrine: Negative for polydipsia and polyuria.  Genitourinary: Negative for dysuria, frequency and urgency.  Musculoskeletal: Positive for arthralgias. Negative for back pain and myalgias.       Pain in upper legs/groin area with walking  Skin: Negative for pallor and rash.  Allergic/Immunologic: Negative for environmental allergies.  Neurological: Negative for dizziness, syncope and headaches.  Hematological: Negative for adenopathy. Does not bruise/bleed easily.  Psychiatric/Behavioral: Negative for decreased concentration and dysphoric mood. The patient is nervous/anxious.        Objective:   Physical Exam Constitutional:      General: She is not in acute distress.    Appearance: Normal appearance. She is well-developed and normal weight. She is not ill-appearing or diaphoretic.  HENT:     Head: Normocephalic and atraumatic.     Right Ear: Tympanic membrane, ear canal and external ear normal.     Left Ear: Tympanic membrane, ear canal and  external ear normal.     Nose: Nose normal. No congestion.     Mouth/Throat:     Mouth: Mucous membranes are moist.     Pharynx: Oropharynx is clear. No posterior oropharyngeal erythema.  Eyes:     General: No scleral icterus.    Extraocular Movements:  Extraocular movements intact.     Conjunctiva/sclera: Conjunctivae normal.     Pupils: Pupils are equal, round, and reactive to light.  Neck:     Musculoskeletal: Normal range of motion and neck supple. No neck rigidity or muscular tenderness.     Thyroid: No thyromegaly.     Vascular: No carotid bruit or JVD.  Cardiovascular:     Rate and Rhythm: Normal rate and regular rhythm.     Pulses: Normal pulses.     Heart sounds: Normal heart sounds. No gallop.   Pulmonary:     Effort: Pulmonary effort is normal. No respiratory distress.     Breath sounds: Normal breath sounds. No wheezing.     Comments: Good air exch Chest:     Chest wall: No tenderness.  Abdominal:     General: Bowel sounds are normal. There is no distension or abdominal bruit.     Palpations: Abdomen is soft. There is no mass.     Tenderness: There is no abdominal tenderness.     Hernia: No hernia is present.  Genitourinary:    Comments: Breast exam left: No mass, nodules, thickening, tenderness, bulging, retraction, inflamation, nipple discharge or skin changes noted.  No axillary or clavicular LA.     R mastectomy site appears clear with no M or tenderness Musculoskeletal: Normal range of motion.        General: No tenderness.     Right lower leg: No edema.     Left lower leg: No edema.  Lymphadenopathy:     Cervical: No cervical adenopathy.  Skin:    General: Skin is warm and dry.     Coloration: Skin is not pale.     Findings: No erythema or rash.     Comments: Diffuse SKs Some lentigines fair  Neurological:     Mental Status: She is alert. Mental status is at baseline.     Cranial Nerves: No cranial nerve deficit.     Motor: No abnormal muscle tone.     Coordination: Coordination normal.     Gait: Gait normal.     Deep Tendon Reflexes: Reflexes are normal and symmetric. Reflexes normal.  Psychiatric:        Mood and Affect: Mood normal.        Cognition and Memory: Cognition and memory normal.            Assessment & Plan:   Problem List Items Addressed This Visit      Cardiovascular and Mediastinum   Essential hypertension    Pt has significant white coat effect and states bp are much better at home  BP Readings from Last 1 Encounters:  08/14/19 (!) 158/92   No changes needed but did request she check 2 wk of readings (disc way to do properly) and send in Will adj tx if needed  Enc low sodium diet and exercise as tolerated Most recent labs reviewed  Disc lifstyle change with low sodium diet and exercise        Relevant Medications   carvedilol (COREG) 25 MG tablet     Musculoskeletal and Integument   Osteoporosis    dexa 2/20 On tamoxifen  Intol of bisphosphenate in the past (per oncology) Now on prolia and tolerating well No new fractures  One fall from climbing (disc fall prev) Takes ca and D twice daily  Level of D is therapeutic at 60        Other   Hyperlipidemia    Disc goals for lipids and reasons to control them Rev last labs with pt Rev low sat fat diet in detail  LDL is over 130  HDL down  Given info re: diet to consider  Continue to watch      Relevant Medications   carvedilol (COREG) 25 MG tablet   Generalized anxiety disorder    Stable with mirtazapine and klonopin Reviewed stressors/ coping techniques/symptoms/ support sources/ tx options and side effects in detail today       Prediabetes    Lab Results  Component Value Date   HGBA1C 5.3 08/07/2019   disc imp of low glycemic diet and wt loss to prevent DM2       Encounter for Medicare annual wellness exam - Primary    Reviewed health habits including diet and exercise and skin cancer prevention Reviewed appropriate screening tests for age  Also reviewed health mt list, fam hx and immunization status , as well as social and family history   See HPI Labs reviewed  Pt has mammogram planned 09/16/19 and also oncology f/u for personal breast cancer hx Flu vaccine given today  dexa  rev-taking prolia now  Fall prevention disc (one fall this year climbing)  Pt needs to update POA- materials given  No cognitive concerns Nl hearing screen  Vision screen is sub optimal- enc pt to get a routine eye exam  bp is high-inst pt to check at home and get back to [place]        Malignant neoplasm of lower-inner quadrant of right breast of female, estrogen receptor positive ([place])    S/p R mastectomy  Now on tamoxifen - ? 10 y  Watching bone density Continues oncology f/u      Leg pain    Suspect hip OA causing groin pain with movement She will return for imaging in the future       Other Visit Diagnoses    Need for influenza vaccination       Relevant Orders   Flu Vaccine QUAD High Dose(Fluad) (Completed)

## 2019-08-30 ENCOUNTER — Other Ambulatory Visit: Payer: Self-pay | Admitting: *Deleted

## 2019-08-30 MED ORDER — MIRTAZAPINE 15 MG PO TABS: 15.0000 mg | ORAL_TABLET | Freq: Every day | ORAL | 3 refills | Status: DC

## 2019-08-30 NOTE — Telephone Encounter (Signed)
AWV 08/14/19, last filled on 08/07/18 #90 tabs with 3 refills  [facility] [address].

## 2019-09-15 ENCOUNTER — Other Ambulatory Visit: Payer: Self-pay | Admitting: Family Medicine

## 2019-09-16 ENCOUNTER — Ambulatory Visit
Admission: RE | Admit: 2019-09-16 | Discharge: 2019-09-16 | Disposition: A | Discharge status: Home / Self Care | Payer: Medicare Other | Source: Ambulatory Visit | Attending: Oncology | Admitting: Oncology

## 2019-09-16 DIAGNOSIS — Z79811 Long term (current) use of aromatase inhibitors: Secondary | ICD-10-CM | POA: Diagnosis not present

## 2019-09-16 DIAGNOSIS — M810 Age-related osteoporosis without current pathological fracture: Secondary | ICD-10-CM

## 2019-09-16 DIAGNOSIS — Z170 Estrogen receptor positive status [ER+]: Secondary | ICD-10-CM | POA: Diagnosis not present

## 2019-09-16 DIAGNOSIS — C50311 Malignant neoplasm of lower-inner quadrant of right female breast: Secondary | ICD-10-CM | POA: Diagnosis not present

## 2019-09-16 DIAGNOSIS — Z1231 Encounter for screening mammogram for malignant neoplasm of breast: Secondary | ICD-10-CM | POA: Diagnosis not present

## 2019-09-16 NOTE — Telephone Encounter (Signed)
Name of [place] Name of [facility]acy:[facility] [place] or Written Date and Quantity: 07/24/19 #90 tabs with 0 refills Last Office Visit and Type:08/14/19 CPE Next Office Visit and Type:none scheduled

## 2019-12-30 ENCOUNTER — Ambulatory Visit
Admission: RE | Admit: 2019-12-30 | Discharge: 2019-12-30 | Disposition: A | Discharge status: Home / Self Care | Payer: Medicare Other | Source: Ambulatory Visit | Attending: Oncology | Admitting: Oncology

## 2019-12-30 DIAGNOSIS — Z79811 Long term (current) use of aromatase inhibitors: Secondary | ICD-10-CM

## 2019-12-30 DIAGNOSIS — Z170 Estrogen receptor positive status [ER+]: Secondary | ICD-10-CM | POA: Insufficient documentation

## 2019-12-30 DIAGNOSIS — M810 Age-related osteoporosis without current pathological fracture: Secondary | ICD-10-CM

## 2019-12-30 DIAGNOSIS — Z780 Asymptomatic menopausal state: Secondary | ICD-10-CM | POA: Diagnosis not present

## 2019-12-30 DIAGNOSIS — M85832 Other specified disorders of bone density and structure, left forearm: Secondary | ICD-10-CM | POA: Diagnosis not present

## 2019-12-30 DIAGNOSIS — C50311 Malignant neoplasm of lower-inner quadrant of right female breast: Secondary | ICD-10-CM

## 2020-01-17 NOTE — Progress Notes (Signed)
[place]  [phone]  [phone]     Shannon Stephens DOB: 02-14-1946  MR#: [id number]  AOZ#:[id number]  Patient Care Team: Abner Greenspan, MD as PCP - General Byrnett, Forest Gleason, MD (General Surgery) Tower, Wynelle Fanny, MD as Consulting Physician (Family Medicine) Forest Gleason, MD (Inactive) as Consulting Physician (Oncology)  CHIEF COMPLAINT: Pathologic stage Ia adenocarcinoma of the lower inner quadrant of the right breast.  INTERVAL HISTORY:  Patient returns to clinic today for routine 63-monthevaluation and continuation of Prolia.  She currently feels well and is asymptomatic.  She continues to tolerate tamoxifen without significant side effects.  She has noted some unintentional weight loss, but states she has a decreased appetite and does not eat well since she lives alone.  She has no neurologic complaints. She denies any recent fevers or illnesses.  She denies any chest pain, shortness of breath, cough, or hemoptysis.  She denies any nausea, vomiting, constipation, or diarrhea. She has no urinary complaints.  Patient offers no further specific complaints today.  REVIEW OF SYSTEMS:   Review of Systems  Constitutional: Negative.  Negative for fever, malaise/fatigue and weight loss.  Respiratory: Negative.  Negative for cough and shortness of breath.   Cardiovascular: Negative.  Negative for chest pain and leg swelling.  Gastrointestinal: Negative.  Negative for abdominal pain.  Genitourinary: Negative.  Negative for dysuria.  Musculoskeletal: Negative.  Negative for back pain.  Skin: Negative.  Negative for rash.  Neurological: Negative.  Negative for dizziness, sensory change, focal weakness, weakness and headaches.  Psychiatric/Behavioral: Negative.  The patient is not nervous/anxious.     As per HPI. Otherwise, a complete review of systems is negative.  ONCOLOGY HISTORY: Oncology History Overview Note  Carcinoma of breast at 4:00 position at  the right breast status post simple mastectomy and anterior lymph node evaluation  Right simple mastectomy done on September 04, 2014 T1c N0 M0 tumor. estrogen receptor positive progesterone receptor positive HER-2 receptor not overexpressed.  Oncotype DX score 22. 10 year distant recurrence rate 14% with tamoxifen alone.  Patient started on letrozole, 2015.   [place]  [place] Initial Diagnosis   Malignant neoplasm of right breast ([facility]     PAST MEDICAL HISTORY: Past Medical History:  Diagnosis Date  . Allergic rhinitis   . Anxiety   . Breast cancer ([place] 2015   RT MASTECTOMY  . Breast cancer of lower-inner quadrant of right female breast ([facility] November 2015   T1c, N0/ ER + ; PR+; Her 2 neu not overexpressed. Oncotype DX: 14 % recurrence risk w/ antiestrogen alone (low intermediate group).  . Burning sensation    chronic,  skin  . Chronic cervicitis    with squamous metaplasia  . History of ultrasound, pelvic 03/29/06   WNL  . Hyperlipidemia 2003  . Hypertension   . Menopause     PAST SURGICAL HISTORY: Past Surgical History:  Procedure Laterality Date  . BREAST BIOPSY Right 2015   POS  . BREAST SURGERY Right 09/04/14   Mastectomy   . COLONOSCOPY  3/02   polyps  . COLONOSCOPY  05-26-15   Dr KDeatra Ina 3 mm tubular adenoma in the sigmoid. Repeat exam 2021.  .Marland KitchenDILATION AND CURETTAGE OF UTERUS  1987   for DQB  . MASTECTOMY Right 08/2014   BREAST CA  . OTHER SURGICAL HISTORY     Neg renal ultrasound  FAMILY HISTORY Family History  Problem Relation Age of Onset  . Diabetes Father   . Hypertension Father   . Heart failure Father   . Coronary artery disease Father   . Hypertension Mother   . Colon cancer Neg Hx   . Colon polyps Neg Hx   . Pancreatic cancer Neg Hx   . Rectal cancer Neg Hx   . Stomach cancer Neg Hx   . Ulcerative colitis Neg Hx   . Esophageal cancer  Neg Hx   . Crohn's disease Neg Hx   . Breast cancer Neg Hx     GYNECOLOGIC HISTORY:  No LMP recorded. Patient is postmenopausal.     ADVANCED DIRECTIVES:    HEALTH MAINTENANCE: Social History   Tobacco Use  . Smoking status: Never Smoker  . Smokeless tobacco: Never Used  Substance Use Topics  . Alcohol use: No    Alcohol/week: 0.0 standard drinks  . Drug use: No     Colonoscopy:  PAP:  Bone density:  Mammogram:  Allergies  Allergen Reactions  . Alendronate Sodium     FOSAMAX REACTION: body pain- severe all over  . Simvastatin     Muscle pain     Current Outpatient Medications  Medication Sig Dispense Refill  . B Complex Vitamins (VITAMIN B COMPLEX PO) Take by mouth daily.    . Calcium Carbonate-Vitamin D 600-400 MG-UNIT per tablet Take 2 tablets by mouth daily.      . carvedilol (COREG) 25 MG tablet Take 1 tablet (25 mg total) by mouth 2 (two) times daily. 180 tablet 3  . clonazePAM (KLONOPIN) 1 MG tablet TAKE 1 TABLET (1 MG TOTAL) BY MOUTH 3 (THREE) TIMES DAILY AS NEEDED FOR ANXIETY. 90 tablet 3  . mirtazapine (REMERON) 15 MG tablet Take 1 tablet (15 mg total) by mouth at bedtime. 90 tablet 3  . Multiple Vitamin (MULTIVITAMIN) tablet Take 1 tablet by mouth daily.    . Potassium 95 MG TABS Take by mouth daily.    . tamoxifen (NOLVADEX) 20 MG tablet TAKE 1 TABLET BY MOUTH EVERY DAY 90 tablet 2   No current facility-administered medications for this visit.    OBJECTIVE: BP 140/84 (BP Location: Left Arm, Patient Position: Sitting, Cuff Size: Small)   Pulse 80   Temp (!) 97.3 F (36.3 C) (Tympanic)   Wt 121 lb 6.4 oz (55.1 kg)   SpO2 98%   BMI 21.68 kg/m    Body mass index is 21.68 kg/m.    ECOG FS:0 - Asymptomatic  General: Well-developed, well-nourished, no acute distress. Eyes: Pink conjunctiva, anicteric sclera. HEENT: Normocephalic, moist mucous membranes. Breast: Exam deferred today. Lungs: No audible wheezing or coughing. Heart: Regular rate and  rhythm. Abdomen: Soft, nontender, no obvious distention. Musculoskeletal: No edema, cyanosis, or clubbing. Neuro: Alert, answering all questions appropriately. Cranial nerves grossly intact. Skin: No rashes or petechiae noted. Psych: Normal affect.   LAB RESULTS:  Clinical Support on 01/20/2020  Component Date Value Ref Range Status  . Sodium 01/20/2020 139  135 - 145 mmol/L Final  . Potassium 01/20/2020 4.0  3.5 - 5.1 mmol/L Final  . Chloride 01/20/2020 101  98 - 111 mmol/L Final  . CO2 01/20/2020 28  22 - 32 mmol/L Final  . Glucose, Bld 01/20/2020 114* 70 - 99 mg/dL Final   Glucose reference range applies only to samples taken after fasting for at least 8 hours.  . BUN 01/20/2020 21  8 - 23 mg/dL Final  . Creatinine,  Ser 01/20/2020 0.86  0.44 - 1.00 mg/dL Final  . Calcium 01/20/2020 10.0  8.9 - 10.3 mg/dL Final  . GFR calc non Af Amer 01/20/2020 >60  >60 mL/min Final  . GFR calc Af Amer 01/20/2020 >60  >60 mL/min Final  . Anion gap 01/20/2020 10  5 - 15 Final   Performed at [facility], [address] [place], [place] [address]    STUDIES: No results found.  ASSESSMENT: Pathologic stage Ia adenocarcinoma of the lower inner quadrant of the right breast.  PLAN:    1. Pathologic stage Ia adenocarcinoma of the lower inner quadrant of the right breast: Patient is status post right mastectomy in November 2015. Her Oncotype score of 22 was intermediate risk, but patient did not receive chemotherapy.  Letrozole was discontinued secondary to osteoporosis and patient was initiated on tamoxifen.  Given her intermediate risk score, patient agreed to take a 2 additional years of treatment completing in December 2022.  Her most recent mammogram on September 16, 2019 was reported as BI-RADS 1.  Repeat in November 2021.  Return to clinic in 6 months for routine evaluation.  2.  Osteoporosis: Patient's bone mineral density on December 27, 2018 reported T score of -2.9.  This is  significantly worse than 2 years prior when her T score was only -1.2.  Her most recent bone mineral density on December 30, 2019 had improved to a T score of -2.4.  Patient reports she cannot tolerate Fosamax.  Proceed with Prolia today.  Continue calcium and vitamin D supplementation.  Repeat bone mineral density in March 2022.  Return to clinic in 6 months for routine evaluation and continuation of Prolia.  I spent a total of 30 minutes reviewing chart data, face-to-face evaluation with the patient, counseling and coordination of care as detailed above.  Patient expressed understanding and was in agreement with this plan. She also understands that She can call clinic at any time with any questions, concerns, or complaints.     Lloyd Huger, MD   01/20/2020 11:53 AM

## 2020-01-17 NOTE — Progress Notes (Signed)
Tried calling patient no answer left message  

## 2020-01-20 ENCOUNTER — Inpatient Hospital Stay: Payer: Medicare Other

## 2020-01-20 ENCOUNTER — Inpatient Hospital Stay: Payer: Medicare Other | Attending: Oncology

## 2020-01-20 ENCOUNTER — Inpatient Hospital Stay (HOSPITAL_BASED_OUTPATIENT_CLINIC_OR_DEPARTMENT_OTHER): Payer: Medicare Other | Admitting: Oncology

## 2020-01-20 ENCOUNTER — Other Ambulatory Visit: Payer: Self-pay

## 2020-01-20 VITALS — BP 140/84 | HR 80 | Temp 97.3°F | Wt 121.4 lb

## 2020-01-20 DIAGNOSIS — F419 Anxiety disorder, unspecified: Secondary | ICD-10-CM | POA: Insufficient documentation

## 2020-01-20 DIAGNOSIS — Z9011 Acquired absence of right breast and nipple: Secondary | ICD-10-CM | POA: Diagnosis not present

## 2020-01-20 DIAGNOSIS — I10 Essential (primary) hypertension: Secondary | ICD-10-CM | POA: Diagnosis not present

## 2020-01-20 DIAGNOSIS — E785 Hyperlipidemia, unspecified: Secondary | ICD-10-CM | POA: Insufficient documentation

## 2020-01-20 DIAGNOSIS — Z79810 Long term (current) use of selective estrogen receptor modulators (SERMs): Secondary | ICD-10-CM | POA: Insufficient documentation

## 2020-01-20 DIAGNOSIS — Z170 Estrogen receptor positive status [ER+]: Secondary | ICD-10-CM | POA: Diagnosis not present

## 2020-01-20 DIAGNOSIS — M810 Age-related osteoporosis without current pathological fracture: Secondary | ICD-10-CM | POA: Insufficient documentation

## 2020-01-20 DIAGNOSIS — C50311 Malignant neoplasm of lower-inner quadrant of right female breast: Secondary | ICD-10-CM | POA: Diagnosis not present

## 2020-01-20 DIAGNOSIS — Z833 Family history of diabetes mellitus: Secondary | ICD-10-CM | POA: Diagnosis not present

## 2020-01-20 DIAGNOSIS — Z79811 Long term (current) use of aromatase inhibitors: Secondary | ICD-10-CM

## 2020-01-20 DIAGNOSIS — Z8249 Family history of ischemic heart disease and other diseases of the circulatory system: Secondary | ICD-10-CM | POA: Diagnosis not present

## 2020-01-20 DIAGNOSIS — Z79899 Other long term (current) drug therapy: Secondary | ICD-10-CM | POA: Insufficient documentation

## 2020-01-20 LAB — BASIC METABOLIC PANEL
Anion gap: 10 (ref 5–15)
BUN: 21 mg/dL (ref 8–23)
CO2: 28 mmol/L (ref 22–32)
Calcium: 10 mg/dL (ref 8.9–10.3)
Chloride: 101 mmol/L (ref 98–111)
Creatinine, Ser: 0.86 mg/dL (ref 0.44–1.00)
GFR calc Af Amer: >60 mL/min (ref >60)
GFR calc non Af Amer: >60 mL/min (ref >60)
Glucose, Bld: 114 mg/dL — ABNORMAL HIGH (ref 70–99)
Potassium: 4 mmol/L (ref 3.5–5.1)
Sodium: 139 mmol/L (ref 135–145)

## 2020-01-20 MED ORDER — DENOSUMAB 60 MG/ML ~~LOC~~ SOSY
60.0000 mg | PREFILLED_SYRINGE | Freq: Once | SUBCUTANEOUS | Status: AC
  Administered 2020-01-20: 60 mg via SUBCUTANEOUS
  Filled 2020-01-20: qty 1

## 2020-01-20 NOTE — Progress Notes (Signed)
Patient states she would like to know if she is ok to get COVID vaccine. Patient is also having pain in the crease of legs both right and left. She states it hurts to move and walk.

## 2020-03-20 ENCOUNTER — Other Ambulatory Visit: Payer: Self-pay

## 2020-03-20 ENCOUNTER — Ambulatory Visit: Payer: Medicare Other | Attending: Oncology

## 2020-03-20 DIAGNOSIS — Z23 Encounter for immunization: Secondary | ICD-10-CM

## 2020-03-20 NOTE — Progress Notes (Signed)
   Covid-19 Vaccination Clinic  Name:  Shannon Stephens    MRN: [id number] DOB: 1946-02-11  03/20/2020  [name] was observed post Covid-19 immunization for 15 minutes without incident. She was provided with Vaccine Information Sheet and instruction to access the V-Safe system.   [name] was instructed to call 911 with any severe reactions post vaccine: Marland Kitchen Difficulty breathing  . Swelling of face and throat  . A fast heartbeat  . A bad rash all over body  . Dizziness and weakness   Immunizations Administered    Name Date Dose VIS Date Route   Pfizer COVID-19 Vaccine 03/20/2020 10:39 AM 0.3 mL 12/25/2018 Intramuscular   Manufacturer: [place]   Lot: [id number]   [place]: [id number]

## 2020-03-25 ENCOUNTER — Other Ambulatory Visit: Payer: Self-pay | Admitting: Oncology

## 2020-04-14 ENCOUNTER — Ambulatory Visit: Payer: Medicare Other | Attending: Internal Medicine

## 2020-04-14 DIAGNOSIS — Z23 Encounter for immunization: Secondary | ICD-10-CM

## 2020-04-14 NOTE — Progress Notes (Signed)
   Covid-19 Vaccination Clinic  Name:  MERCADES BAJAJ    MRN: [id number] DOB: Aug 25, 1946  04/14/2020  Ms. Para was observed post Covid-19 immunization for 15 minutes without incident. She was provided with Vaccine Information Sheet and instruction to access the V-Safe system.   Ms. Shrider was instructed to call 911 with any severe reactions post vaccine: Marland Kitchen Difficulty breathing  . Swelling of face and throat  . A fast heartbeat  . A bad rash all over body  . Dizziness and weakness   Immunizations Administered    Name Date Dose VIS Date Route   Pfizer COVID-19 Vaccine 04/14/2020 10:08 AM 0.3 mL 12/25/2018 Intramuscular   Manufacturer: [place]   Lot: [id number]   [place]: [id number]

## 2020-05-21 ENCOUNTER — Other Ambulatory Visit: Payer: Self-pay | Admitting: Family Medicine

## 2020-05-21 NOTE — Telephone Encounter (Signed)
Name of [place] Name of Pharmacy:[facility] [address] [address] or Written Date and Quantity: 09/16/19#90 tabs with 3refills Last Office Visit and Type:08/14/19 CPE Next Office Visit and Type:none scheduled

## 2020-07-19 NOTE — Progress Notes (Signed)
[facility]  [phone]  [phone]     Shannon Stephens DOB: 09/23/1946  MR#: [id number]  QIW#:[id number]  Patient Care Team: Abner Greenspan, MD as PCP - General Byrnett, Forest Gleason, MD (General Surgery) Tower, Wynelle Fanny, MD as Consulting Physician (Family Medicine) Forest Gleason, MD (Inactive) as Consulting Physician (Oncology)  CHIEF COMPLAINT: Pathologic stage Ia adenocarcinoma of the lower inner quadrant of the right breast.  INTERVAL HISTORY: Patient returns to clinic today for routine 14-monthevaluation and continuation of Prolia.  She currently feels well and is asymptomatic.  She continues to tolerate tamoxifen without significant side effects.  She continues to have issues with poor appetite, and has lost weight in the interim.  She has no neurologic complaints. She denies any recent fevers or illnesses.  She denies any chest pain, shortness of breath, cough, or hemoptysis.  She denies any nausea, vomiting, constipation, or diarrhea. She has no urinary complaints.  Patient offers no further specific complaints today.  REVIEW OF SYSTEMS:   Review of Systems  Constitutional: Positive for weight loss. Negative for fever and malaise/fatigue.  Respiratory: Negative.  Negative for cough and shortness of breath.   Cardiovascular: Negative.  Negative for chest pain and leg swelling.  Gastrointestinal: Negative.  Negative for abdominal pain.  Genitourinary: Negative.  Negative for dysuria.  Musculoskeletal: Negative.  Negative for back pain.  Skin: Negative.  Negative for rash.  Neurological: Negative.  Negative for dizziness, sensory change, focal weakness, weakness and headaches.  Psychiatric/Behavioral: Negative.  The patient is not nervous/anxious.     As per HPI. Otherwise, a complete review of systems is negative.  ONCOLOGY HISTORY: Oncology History Overview Note  Carcinoma of breast at 4:00 position at the right breast status post simple  mastectomy and anterior lymph node evaluation  Right simple mastectomy done on September 04, 2014 T1c N0 M0 tumor. estrogen receptor positive progesterone receptor positive HER-2 receptor not overexpressed.  Oncotype DX score 22. 10 year distant recurrence rate 14% with tamoxifen alone.  Patient started on letrozole, 2015.   Malignant neoplasm of lower-inner quadrant of right breast of female, estrogen receptor positive [place]  08/25/2014 Initial Diagnosis   Malignant neoplasm of right breast ([facility]     PAST MEDICAL HISTORY: Past Medical History:  Diagnosis Date  . Allergic rhinitis   . Anxiety   . [place] [place]   RT MASTECTOMY  . Breast cancer of lower-inner quadrant of right female breast ([facility] November 2015   T1c, N0/ ER + ; PR+; Her 2 neu not overexpressed. Oncotype DX: 14 % recurrence risk w/ antiestrogen alone (low intermediate group).  . Burning sensation    chronic,  skin  . Chronic cervicitis    with squamous metaplasia  . History of ultrasound, pelvic 03/29/06   WNL  . Hyperlipidemia 2003  . Hypertension   . Menopause     PAST SURGICAL HISTORY: Past Surgical History:  Procedure Laterality Date  . BREAST BIOPSY Right 2015   POS  . BREAST SURGERY Right 09/04/14   Mastectomy   . COLONOSCOPY  3/02   polyps  . COLONOSCOPY  05-26-15   Dr KDeatra Ina 3 mm tubular adenoma in the sigmoid. Repeat exam 2021.  .Marland KitchenDILATION AND CURETTAGE OF UTERUS  1987   for DQB  . MASTECTOMY Right 08/2014   BREAST CA  . OTHER SURGICAL HISTORY     Neg renal ultrasound    FAMILY HISTORY Family History  Problem Relation Age of  Onset  . Diabetes Father   . Hypertension Father   . Heart failure Father   . Coronary artery disease Father   . Hypertension Mother   . Colon cancer Neg Hx   . Colon polyps Neg Hx   . Pancreatic cancer Neg Hx   . Rectal cancer Neg Hx   . Stomach cancer Neg Hx   . Ulcerative colitis Neg Hx   . Esophageal cancer Neg Hx   . Crohn's disease Neg Hx    . Breast cancer Neg Hx     GYNECOLOGIC HISTORY:  No LMP recorded. Patient is postmenopausal.     ADVANCED DIRECTIVES:    HEALTH MAINTENANCE: Social History   Tobacco Use  . Smoking status: Never Smoker  . Smokeless tobacco: Never Used  Vaping Use  . Vaping Use: Never used  Substance Use Topics  . Alcohol use: No    Alcohol/week: 0.0 standard drinks  . Drug use: No     Colonoscopy:  PAP:  Bone density:  Mammogram:  Allergies  Allergen Reactions  . Alendronate Sodium     FOSAMAX REACTION: body pain- severe all over  . Simvastatin     Muscle pain     Current Outpatient Medications  Medication Sig Dispense Refill  . B Complex Vitamins (VITAMIN B COMPLEX PO) Take by mouth daily.    . Calcium Carbonate-Vitamin D 600-400 MG-UNIT per tablet Take 2 tablets by mouth daily.      . carvedilol (COREG) 25 MG tablet Take 1 tablet (25 mg total) by mouth 2 (two) times daily. 180 tablet 3  . clonazePAM (KLONOPIN) 1 MG tablet TAKE 1 TABLET BY MOUTH 3 TIMES DAILY AS NEEDED FOR ANXIETY. 90 tablet 3  . mirtazapine (REMERON) 15 MG tablet Take 1 tablet (15 mg total) by mouth at bedtime. 90 tablet 3  . Multiple Vitamin (MULTIVITAMIN) tablet Take 1 tablet by mouth daily.    . Potassium 95 MG TABS Take by mouth daily.    . tamoxifen (NOLVADEX) 20 MG tablet TAKE 1 TABLET BY MOUTH EVERY DAY 90 tablet 3   No current facility-administered medications for this visit.    OBJECTIVE: BP (!) 169/85 (BP Location: Left Arm, Patient Position: Sitting, Cuff Size: Normal)   Pulse 65   Temp 98.3 F (36.8 C) (Tympanic)   Resp 18   Wt 117 lb 8 oz (53.3 kg)   SpO2 98%   BMI 20.98 kg/m    Body mass index is 20.98 kg/m.    ECOG FS:0 - Asymptomatic  General: Well-developed, well-nourished, no acute distress. Eyes: Pink conjunctiva, anicteric sclera. HEENT: Normocephalic, moist mucous membranes. Breast: Exam deferred today. Lungs: No audible wheezing or coughing. Heart: Regular rate and  rhythm. Abdomen: Soft, nontender, no obvious distention. Musculoskeletal: No edema, cyanosis, or clubbing. Neuro: Alert, answering all questions appropriately. Cranial nerves grossly intact. Skin: No rashes or petechiae noted. Psych: Normal affect.    LAB RESULTS:  Appointment on 07/22/2020  Component Date Value Ref Range Status  . Sodium 07/22/2020 142  135 - 145 mmol/L Final  . Potassium 07/22/2020 4.1  3.5 - 5.1 mmol/L Final  . Chloride 07/22/2020 102  98 - 111 mmol/L Final  . CO2 07/22/2020 30  22 - 32 mmol/L Final  . Glucose, Bld 07/22/2020 138* 70 - 99 mg/dL Final   Glucose reference range applies only to samples taken after fasting for at least 8 hours.  . BUN 07/22/2020 13  8 - 23 mg/dL Final  . Creatinine,  Ser 07/22/2020 0.96  0.44 - 1.00 mg/dL Final  . Calcium 07/22/2020 9.7  8.9 - 10.3 mg/dL Final  . GFR calc non Af Amer 07/22/2020 59* >60 mL/min Final  . GFR calc Af Amer 07/22/2020 >60  >60 mL/min Final  . Anion gap 07/22/2020 10  5 - 15 Final   Performed at [facility], [address] [place], [place] [address]    STUDIES: No results found.  ASSESSMENT: Pathologic stage Ia adenocarcinoma of the lower inner quadrant of the right breast.  PLAN:    1. Pathologic stage Ia adenocarcinoma of the lower inner quadrant of the right breast: Patient is status post right mastectomy in November 2015. Her Oncotype score of 22 was intermediate risk, but patient did not receive chemotherapy.  Letrozole was discontinued secondary to osteoporosis and patient was initiated on tamoxifen.  Given her intermediate risk score, patient agreed to take a 2 additional years of treatment completing in December 2022.  Her most recent mammogram on September 16, 2019 was reported as BI-RADS 1.  Repeat in November 2021.  Return to clinic in 6 months for routine evaluation. 2.  Osteoporosis: Patient's bone mineral density on December 27, 2018 reported T score of -2.9.  This is significantly  worse than 2 years prior when her T score was only -1.2.  Her most recent bone mineral density on December 30, 2019 improved to a T score of -2.4.  Patient reports she cannot tolerate Fosamax.  Proceed with Prolia today.  Continue calcium and vitamin D supplementation.  Repeat bone mineral density in March 2022.  Return to clinic in 6 months for routine evaluation and continuation of treatment. 3.  Weight loss: Patient previously reported this was likely secondary to living alone and is likely dietary in nature.  Continue to monitor.   Patient expressed understanding and was in agreement with this plan. She also understands that She can call clinic at any time with any questions, concerns, or complaints.     Lloyd Huger, MD   07/24/2020 7:06 AM

## 2020-07-21 ENCOUNTER — Other Ambulatory Visit: Payer: Self-pay

## 2020-07-21 DIAGNOSIS — M810 Age-related osteoporosis without current pathological fracture: Secondary | ICD-10-CM

## 2020-07-21 NOTE — Progress Notes (Signed)
Patient called for pre assessment. Denies any pain. States she is having trouble with her appetite. She doesn't have one. She states she is also having some issues with crease in back of legs. She states she fell a few weeks ago and since then she has been experiencing some pain in that area.

## 2020-07-22 ENCOUNTER — Inpatient Hospital Stay: Payer: Medicare Other | Attending: Oncology

## 2020-07-22 ENCOUNTER — Other Ambulatory Visit: Payer: Self-pay

## 2020-07-22 ENCOUNTER — Inpatient Hospital Stay (HOSPITAL_BASED_OUTPATIENT_CLINIC_OR_DEPARTMENT_OTHER): Payer: Medicare Other | Admitting: Oncology

## 2020-07-22 ENCOUNTER — Encounter: Payer: Self-pay | Admitting: Oncology

## 2020-07-22 ENCOUNTER — Inpatient Hospital Stay: Payer: Medicare Other

## 2020-07-22 VITALS — BP 169/85 | HR 65 | Temp 98.3°F | Resp 18 | Wt 117.5 lb

## 2020-07-22 DIAGNOSIS — M810 Age-related osteoporosis without current pathological fracture: Secondary | ICD-10-CM | POA: Diagnosis not present

## 2020-07-22 DIAGNOSIS — Z79810 Long term (current) use of selective estrogen receptor modulators (SERMs): Secondary | ICD-10-CM | POA: Diagnosis not present

## 2020-07-22 DIAGNOSIS — C50311 Malignant neoplasm of lower-inner quadrant of right female breast: Secondary | ICD-10-CM | POA: Diagnosis not present

## 2020-07-22 DIAGNOSIS — Z170 Estrogen receptor positive status [ER+]: Secondary | ICD-10-CM | POA: Insufficient documentation

## 2020-07-22 LAB — BASIC METABOLIC PANEL
Anion gap: 10 (ref 5–15)
BUN: 13 mg/dL (ref 8–23)
CO2: 30 mmol/L (ref 22–32)
Calcium: 9.7 mg/dL (ref 8.9–10.3)
Chloride: 102 mmol/L (ref 98–111)
Creatinine, Ser: 0.96 mg/dL (ref 0.44–1.00)
GFR calc Af Amer: >60 mL/min (ref >60)
GFR calc non Af Amer: 59 mL/min — ABNORMAL LOW (ref >60)
Glucose, Bld: 138 mg/dL — ABNORMAL HIGH (ref 70–99)
Potassium: 4.1 mmol/L (ref 3.5–5.1)
Sodium: 142 mmol/L (ref 135–145)

## 2020-07-22 MED ORDER — DENOSUMAB 60 MG/ML ~~LOC~~ SOSY
60.0000 mg | PREFILLED_SYRINGE | Freq: Once | SUBCUTANEOUS | Status: AC
  Administered 2020-07-22: 60 mg via SUBCUTANEOUS
  Filled 2020-07-22: qty 1

## 2020-08-13 ENCOUNTER — Other Ambulatory Visit: Payer: Self-pay | Admitting: Family Medicine

## 2020-08-13 NOTE — Telephone Encounter (Signed)
Please schedule PE and refill until then  

## 2020-08-13 NOTE — Telephone Encounter (Signed)
Last OV was a CPE in Nov 2020, no recent or future appts., please advise

## 2020-08-17 NOTE — Telephone Encounter (Signed)
Med refilled once and [facility] will reach out to pt to try and get CPE scheduled

## 2020-08-25 ENCOUNTER — Other Ambulatory Visit: Payer: Self-pay | Admitting: Family Medicine

## 2020-08-25 NOTE — Telephone Encounter (Signed)
CPE scheduled 09/18/20, last filled 08/30/19 #90 with 3 refills, please advise

## 2020-09-10 ENCOUNTER — Telehealth: Payer: Self-pay | Admitting: Family Medicine

## 2020-09-10 ENCOUNTER — Ambulatory Visit
Admission: RE | Admit: 2020-09-10 | Discharge: 2020-09-10 | Disposition: A | Discharge status: Home / Self Care | Payer: Medicare Other | Source: Ambulatory Visit | Attending: Oncology | Admitting: Oncology

## 2020-09-10 ENCOUNTER — Other Ambulatory Visit: Payer: Self-pay

## 2020-09-10 DIAGNOSIS — Z170 Estrogen receptor positive status [ER+]: Secondary | ICD-10-CM | POA: Insufficient documentation

## 2020-09-10 DIAGNOSIS — Z853 Personal history of malignant neoplasm of breast: Secondary | ICD-10-CM | POA: Insufficient documentation

## 2020-09-10 DIAGNOSIS — I10 Essential (primary) hypertension: Secondary | ICD-10-CM

## 2020-09-10 DIAGNOSIS — M810 Age-related osteoporosis without current pathological fracture: Secondary | ICD-10-CM

## 2020-09-10 DIAGNOSIS — Z1231 Encounter for screening mammogram for malignant neoplasm of breast: Secondary | ICD-10-CM | POA: Insufficient documentation

## 2020-09-10 DIAGNOSIS — Z9011 Acquired absence of right breast and nipple: Secondary | ICD-10-CM | POA: Insufficient documentation

## 2020-09-10 DIAGNOSIS — E7800 Pure hypercholesterolemia, unspecified: Secondary | ICD-10-CM

## 2020-09-10 DIAGNOSIS — C50311 Malignant neoplasm of lower-inner quadrant of right female breast: Secondary | ICD-10-CM | POA: Diagnosis present

## 2020-09-10 DIAGNOSIS — R7303 Prediabetes: Secondary | ICD-10-CM

## 2020-09-10 NOTE — Telephone Encounter (Signed)
-----   Message from Ellamae Sia sent at 08/26/2020  2:34 PM EDT ----- Regarding: Lab orders for Friday, 11.12.21 Patient is scheduled for CPX labs, please order future labs, Thanks , Karna Christmas

## 2020-09-11 ENCOUNTER — Other Ambulatory Visit (INDEPENDENT_AMBULATORY_CARE_PROVIDER_SITE_OTHER): Payer: Medicare Other

## 2020-09-11 ENCOUNTER — Ambulatory Visit (INDEPENDENT_AMBULATORY_CARE_PROVIDER_SITE_OTHER): Payer: Medicare Other

## 2020-09-11 DIAGNOSIS — E7800 Pure hypercholesterolemia, unspecified: Secondary | ICD-10-CM

## 2020-09-11 DIAGNOSIS — I10 Essential (primary) hypertension: Secondary | ICD-10-CM

## 2020-09-11 DIAGNOSIS — M810 Age-related osteoporosis without current pathological fracture: Secondary | ICD-10-CM | POA: Diagnosis not present

## 2020-09-11 DIAGNOSIS — R7303 Prediabetes: Secondary | ICD-10-CM

## 2020-09-11 DIAGNOSIS — Z0000 Encounter for general adult medical examination without abnormal findings: Secondary | ICD-10-CM

## 2020-09-11 LAB — LIPID PANEL
Cholesterol: 197 mg/dL (ref 0–200)
HDL: 75.4 mg/dL (ref >39.00)
NonHDL: 121.41
Total CHOL/HDL Ratio: 3
Triglycerides: 280 mg/dL — ABNORMAL HIGH (ref 0.0–149.0)
VLDL: 56 mg/dL — ABNORMAL HIGH (ref 0.0–40.0)

## 2020-09-11 LAB — COMPREHENSIVE METABOLIC PANEL
ALT: 17 U/L (ref 0–35)
AST: 23 U/L (ref 0–37)
Albumin: 4.4 g/dL (ref 3.5–5.2)
Alkaline Phosphatase: 34 U/L — ABNORMAL LOW (ref 39–117)
BUN: 18 mg/dL (ref 6–23)
CO2: 30 mEq/L (ref 19–32)
Calcium: 9.4 mg/dL (ref 8.4–10.5)
Chloride: 102 mEq/L (ref 96–112)
Creatinine, Ser: 0.87 mg/dL (ref 0.40–1.20)
GFR: 65.82 mL/min (ref >60.00)
Glucose, Bld: 111 mg/dL — ABNORMAL HIGH (ref 70–99)
Potassium: 4 mEq/L (ref 3.5–5.1)
Sodium: 141 mEq/L (ref 135–145)
Total Bilirubin: 0.7 mg/dL (ref 0.2–1.2)
Total Protein: 7.3 g/dL (ref 6.0–8.3)

## 2020-09-11 LAB — LDL CHOLESTEROL, DIRECT: Direct LDL: 91 mg/dL

## 2020-09-11 LAB — TSH: TSH: 1.62 u[IU]/mL (ref 0.35–4.50)

## 2020-09-11 LAB — CBC WITH DIFFERENTIAL/PLATELET
Basophils Absolute: 0 10*3/uL (ref 0.0–0.1)
Basophils Relative: 0.9 % (ref 0.0–3.0)
Eosinophils Absolute: 0.1 10*3/uL (ref 0.0–0.7)
Eosinophils Relative: 3.1 % (ref 0.0–5.0)
HCT: 42.4 % (ref 36.0–46.0)
Hemoglobin: 14.3 g/dL (ref 12.0–15.0)
Lymphocytes Relative: 33.6 % (ref 12.0–46.0)
Lymphs Abs: 1.5 10*3/uL (ref 0.7–4.0)
MCHC: 33.9 g/dL (ref 30.0–36.0)
MCV: 103.6 fl — ABNORMAL HIGH (ref 78.0–100.0)
Monocytes Absolute: 0.4 10*3/uL (ref 0.1–1.0)
Monocytes Relative: 7.7 % (ref 3.0–12.0)
Neutro Abs: 2.5 10*3/uL (ref 1.4–7.7)
Neutrophils Relative %: 54.7 % (ref 43.0–77.0)
Platelets: 232 10*3/uL (ref 150.0–400.0)
RBC: 4.09 Mil/uL (ref 3.87–5.11)
RDW: 13.8 % (ref 11.5–15.5)
WBC: 4.6 10*3/uL (ref 4.0–10.5)

## 2020-09-11 LAB — VITAMIN D 25 HYDROXY (VIT D DEFICIENCY, FRACTURES): VITD: 65.38 ng/mL (ref 30.00–100.00)

## 2020-09-11 LAB — HEMOGLOBIN A1C: Hgb A1c MFr Bld: 5.2 % (ref 4.6–6.5)

## 2020-09-11 NOTE — Progress Notes (Signed)
PCP notes:  Health Maintenance: Flu- due Colonoscopy- declined   Abnormal Screenings: none   Patient concerns: Bruise on sacral area from fall last year   Nurse concerns: none   Next PCP appt.: 09/23/2020 @ 3:30 pm

## 2020-09-11 NOTE — Patient Instructions (Signed)
Ms. Shannon Stephens , Thank you for taking time to come for your Medicare Wellness Visit. I appreciate your ongoing commitment to your health goals. Please review the following plan we discussed and let me know if I can assist you in the future.   Screening recommendations/referrals: Colonoscopy: declined Mammogram: Up to date, completed 09/10/2020, due 08/2021 Bone Density: Up to date, completed 12/30/2019, scheduled 12/29/2020 Recommended yearly ophthalmology/optometry visit for glaucoma screening and checkup Recommended yearly dental visit for hygiene and checkup  Vaccinations: Influenza vaccine: due, will get at physical Pneumococcal vaccine: Completed series Tdap vaccine: Up to date, completed 10/09/2012, due 09/2022 Shingles vaccine: due, check with your insurance regarding coverage if interested    Covid-19:Completed series  Advanced directives: Please bring a copy of your POA (Power of [place]) and/or Living Will to your next appointment.   Conditions/risks identified: hypertension  Next appointment: Follow up in one year for your annual wellness visit    Preventive Care 55 Years and Older, Female Preventive care refers to lifestyle choices and visits with your health care provider that can promote health and wellness. What does preventive care include?  A yearly physical exam. This is also called an annual well check.  Dental exams once or twice a year.  Routine eye exams. Ask your health care provider how often you should have your eyes checked.  Personal lifestyle choices, including:  Daily care of your teeth and gums.  Regular physical activity.  Eating a healthy diet.  Avoiding tobacco and drug use.  Limiting alcohol use.  Practicing safe sex.  Taking low-dose aspirin every day.  Taking vitamin and mineral supplements as recommended by your health care provider. What happens during an annual well check? The services and screenings done by your health care provider  during your annual well check will depend on your age, overall health, lifestyle risk factors, and family history of disease. Counseling  Your health care provider may ask you questions about your:  Alcohol use.  Tobacco use.  Drug use.  Emotional well-being.  Home and relationship well-being.  Sexual activity.  Eating habits.  History of falls.  Memory and ability to understand (cognition).  Work and work Statistician.  Reproductive health. Screening  You may have the following tests or measurements:  Height, weight, and BMI.  Blood pressure.  Lipid and cholesterol levels. These may be checked every 5 years, or more frequently if you are over 79 years old.  Skin check.  Lung cancer screening. You may have this screening every year starting at age 40 if you have a 30-pack-year history of smoking and currently smoke or have quit within the past 15 years.  Fecal occult blood test (FOBT) of the stool. You may have this test every year starting at age 57.  Flexible sigmoidoscopy or colonoscopy. You may have a sigmoidoscopy every 5 years or a colonoscopy every 10 years starting at age 46.  Hepatitis C blood test.  Hepatitis B blood test.  Sexually transmitted disease (STD) testing.  Diabetes screening. This is done by checking your blood sugar (glucose) after you have not eaten for a while (fasting). You may have this done every 1-3 years.  Bone density scan. This is done to screen for osteoporosis. You may have this done starting at age 26.  Mammogram. This may be done every 1-2 years. Talk to your health care provider about how often you should have regular mammograms. Talk with your health care provider about your test results, treatment options, and if  necessary, the need for more tests. Vaccines  Your health care provider may recommend certain vaccines, such as:  Influenza vaccine. This is recommended every year.  Tetanus, diphtheria, and acellular pertussis  (Tdap, Td) vaccine. You may need a Td booster every 10 years.  Zoster vaccine. You may need this after age 80.  Pneumococcal 13-valent conjugate (PCV13) vaccine. One dose is recommended after age 72.  Pneumococcal polysaccharide (PPSV23) vaccine. One dose is recommended after age 25. Talk to your health care provider about which screenings and vaccines you need and how often you need them. This information is not intended to replace advice given to you by your health care provider. Make sure you discuss any questions you have with your health care provider. Document Released: 11/13/2015 Document Revised: 07/06/2016 Document Reviewed: 08/18/2015 Elsevier Interactive Patient Education  2017 [place] Prevention in the Home Falls can cause injuries. They can happen to people of all ages. There are many things you can do to make your home safe and to help prevent falls. What can I do on the outside of my home?  Regularly fix the edges of walkways and driveways and fix any cracks.  Remove anything that might make you trip as you walk through a door, such as a raised step or threshold.  Trim any bushes or trees on the path to your home.  Use bright outdoor lighting.  Clear any walking paths of anything that might make someone trip, such as rocks or tools.  Regularly check to see if handrails are loose or broken. Make sure that both sides of any steps have handrails.  Any raised decks and porches should have guardrails on the edges.  Have any leaves, snow, or ice cleared regularly.  Use sand or salt on walking paths during winter.  Clean up any spills in your garage right away. This includes oil or grease spills. What can I do in the bathroom?  Use night lights.  Install grab bars by the toilet and in the tub and shower. Do not use towel bars as grab bars.  Use non-skid mats or decals in the tub or shower.  If you need to sit down in the shower, use a plastic, non-slip  stool.  Keep the floor dry. Clean up any water that spills on the floor as soon as it happens.  Remove soap buildup in the tub or shower regularly.  Attach bath mats securely with double-sided non-slip rug tape.  Do not have throw rugs and other things on the floor that can make you trip. What can I do in the bedroom?  Use night lights.  Make sure that you have a light by your bed that is easy to reach.  Do not use any sheets or blankets that are too big for your bed. They should not hang down onto the floor.  Have a firm chair that has side arms. You can use this for support while you get dressed.  Do not have throw rugs and other things on the floor that can make you trip. What can I do in the kitchen?  Clean up any spills right away.  Avoid walking on wet floors.  Keep items that you use a lot in easy-to-reach places.  If you need to reach something above you, use a strong step stool that has a grab bar.  Keep electrical cords out of the way.  Do not use floor polish or wax that makes floors slippery. If you must  use wax, use non-skid floor wax.  Do not have throw rugs and other things on the floor that can make you trip. What can I do with my stairs?  Do not leave any items on the stairs.  Make sure that there are handrails on both sides of the stairs and use them. Fix handrails that are broken or loose. Make sure that handrails are as long as the stairways.  Check any carpeting to make sure that it is firmly attached to the stairs. Fix any carpet that is loose or worn.  Avoid having throw rugs at the top or bottom of the stairs. If you do have throw rugs, attach them to the floor with carpet tape.  Make sure that you have a light switch at the top of the stairs and the bottom of the stairs. If you do not have them, ask someone to add them for you. What else can I do to help prevent falls?  Wear shoes that:  Do not have high heels.  Have rubber bottoms.  Are  comfortable and fit you well.  Are closed at the toe. Do not wear sandals.  If you use a stepladder:  Make sure that it is fully opened. Do not climb a closed stepladder.  Make sure that both sides of the stepladder are locked into place.  Ask someone to hold it for you, if possible.  Clearly mark and make sure that you can see:  Any grab bars or handrails.  First and last steps.  Where the edge of each step is.  Use tools that help you move around (mobility aids) if they are needed. These include:  Canes.  Walkers.  Scooters.  Crutches.  Turn on the lights when you go into a dark area. Replace any light bulbs as soon as they burn out.  Set up your furniture so you have a clear path. Avoid moving your furniture around.  If any of your floors are uneven, fix them.  If there are any pets around you, be aware of where they are.  Review your medicines with your doctor. Some medicines can make you feel dizzy. This can increase your chance of falling. Ask your doctor what other things that you can do to help prevent falls. This information is not intended to replace advice given to you by your health care provider. Make sure you discuss any questions you have with your health care provider. Document Released: 08/13/2009 Document Revised: 03/24/2016 Document Reviewed: 11/21/2014 Elsevier Interactive Patient Education  2017 [employer].

## 2020-09-11 NOTE — Progress Notes (Signed)
Subjective:   Shannon Stephens is a 74 y.o. female who presents for Medicare Annual (Subsequent) preventive examination.  Review of Systems:      I connected with the patient today by telephone and verified that I am speaking with the correct person using two identifiers. Location patient: home Location nurse: work Persons participating in the telephone visit: patient, nurse.   I discussed the limitations, risks, security and privacy concerns of performing an evaluation and management service by telephone and the availability of in person appointments. I also discussed with the patient that there may be a patient responsible charge related to this service. The patient expressed understanding and verbally consented to this telephonic visit.        Cardiac Risk Factors include: advanced age (>23men, >42 women);hypertension     Objective:    Today's Vitals   09/11/20 1432  PainSc: 9    There is no height or weight on file to calculate BMI.  Advanced Directives 09/11/2020 01/20/2020 07/22/2019 01/17/2019 08/01/2018 07/12/2018 01/17/2018  Does Patient Have a Medical Advance Directive? Yes Yes Yes Yes Yes Yes Yes  Type of Paramedic of [employer];Living will [place];Living will Living will Healthcare Power of [place];Living will Living will Living will  Does patient want to make changes to medical advance directive? - No - Patient declined No - Patient declined Yes (Inpatient - patient requests chaplain consult to change a medical advance directive) - - -  Copy of [place] in Chart? No - copy requested No - copy requested - - No - copy requested - -    Current Medications (verified) Outpatient Encounter Medications as of 09/11/2020  Medication Sig  . B Complex Vitamins (VITAMIN B COMPLEX PO) Take by mouth daily.  . Calcium Carbonate-Vitamin D 600-400 MG-UNIT per tablet Take 2 tablets by mouth daily.     . carvedilol (COREG) 25 MG tablet TAKE 1 TABLET BY MOUTH TWICE A DAY  . clonazePAM (KLONOPIN) 1 MG tablet TAKE 1 TABLET BY MOUTH 3 TIMES DAILY AS NEEDED FOR ANXIETY.  . mirtazapine (REMERON) 15 MG tablet TAKE 1 TABLET BY MOUTH EVERYDAY AT BEDTIME  . Multiple Vitamin (MULTIVITAMIN) tablet Take 1 tablet by mouth daily.  . Potassium 95 MG TABS Take by mouth daily.  . tamoxifen (NOLVADEX) 20 MG tablet TAKE 1 TABLET BY MOUTH EVERY DAY   No facility-administered encounter medications on file as of 09/11/2020.    Allergies (verified) Alendronate sodium and Simvastatin   History: Past Medical History:  Diagnosis Date  . Allergic rhinitis   . Anxiety   . Breast cancer ([place]) 2015   RT MASTECTOMY  . Breast cancer of lower-inner quadrant of right female breast [facility]) November 2015   T1c, N0/ ER + ; PR+; Her 2 neu not overexpressed. Oncotype DX: 14 % recurrence risk w/ antiestrogen alone (low intermediate group).  . Burning sensation    chronic,  skin  . Chronic cervicitis    with squamous metaplasia  . History of ultrasound, pelvic 03/29/06   WNL  . Hyperlipidemia 2003  . Hypertension   . Menopause    Past Surgical History:  Procedure Laterality Date  . BREAST BIOPSY Right 2015   POS  . BREAST SURGERY Right 09/04/14   Mastectomy   . COLONOSCOPY  3/02   polyps  . COLONOSCOPY  05-26-15   Dr Deatra Ina, 3 mm tubular adenoma in the sigmoid. Repeat exam 2021.  Marland Kitchen  DILATION AND CURETTAGE OF UTERUS  1987   for DQB  . MASTECTOMY Right 08/2014   BREAST CA  . OTHER SURGICAL HISTORY     Neg renal ultrasound   Family History  Problem Relation Age of Onset  . Diabetes Father   . Hypertension Father   . Heart failure Father   . Coronary artery disease Father   . Hypertension Mother   . Colon cancer Neg Hx   . Colon polyps Neg Hx   . Pancreatic cancer Neg Hx   . Rectal cancer Neg Hx   . Stomach cancer Neg Hx   . Ulcerative colitis Neg Hx   . Esophageal cancer Neg Hx   . Crohn's disease  Neg Hx   . Breast cancer Neg Hx    Social History   Socioeconomic History  . Marital status: Widowed    Spouse name: Not on file  . Number of children: 0  . Years of education: Not on file  . Highest education level: Not on file  Occupational History  . Occupation: Herbalist  Tobacco Use  . Smoking status: Never Smoker  . Smokeless tobacco: Never Used  Vaping Use  . Vaping Use: Never used  Substance and Sexual Activity  . Alcohol use: No    Alcohol/week: 0.0 standard drinks  . Drug use: No  . Sexual activity: Yes  Other Topics Concern  . Not on file  Social History Narrative   Husband had MI and surgery, lots of stress, husband has prostate ca   Has 2 sisters   Social Determinants of Health   Financial Resource Strain: Low Risk   . Difficulty of Paying Living Expenses: Not hard at all  Food Insecurity: No Food Insecurity  . Worried About Charity fundraiser in the Last Year: Never true  . Ran Out of Food in the Last Year: Never true  Transportation Needs: No Transportation Needs  . Lack of Transportation (Medical): No  . Lack of Transportation (Non-Medical): No  Physical Activity: Inactive  . Days of Exercise per Week: 0 days  . Minutes of Exercise per Session: 0 min  Stress: No Stress Concern Present  . Feeling of Stress : Not at all  Social Connections:   . Frequency of Communication with Friends and Family: Not on file  . Frequency of Social Gatherings with Friends and Family: Not on file  . Attends Religious Services: Not on file  . Active Member of Clubs or Organizations: Not on file  . Attends Archivist Meetings: Not on file  . Marital Status: Not on file    Tobacco Counseling Counseling given: Not Answered   Clinical Intake:  Pre-visit preparation completed: Yes  Pain : 0-10 Pain Score: 9  Pain Type: Chronic pain Pain Location: Sacrum Pain Descriptors / Indicators: Aching Pain Onset: More than a month ago Pain Frequency:  Intermittent     Nutritional Risks: None Diabetes: No  How often do you need to have someone help you when you read instructions, pamphlets, or other written materials from your doctor or pharmacy?: 1 - Never What is the last grade level you completed in school?: 12th  Diabetic: No Nutrition Risk Assessment:  Has the patient had any N/V/D within the last 2 months?  No  Does the patient have any non-healing wounds?  No  Has the patient had any unintentional weight loss or weight gain?  Yes , weight loss, does not have an appetite   Diabetes:  Is the patient diabetic?  No  If diabetic, was a CBG obtained today?  N/A Did the patient bring in their glucometer from home?  N/A How often do you monitor your CBG's? N/A.   Financial Strains and Diabetes Management:  Are you having any financial strains with the device, your supplies or your medication? N/A.  Does the patient want to be seen by Chronic Care Management for management of their diabetes?  N/A Would the patient like to be referred to a Nutritionist or for Diabetic Management?  N/A   Interpreter Needed?: No  Information entered by :: CJohnson, LPN   Activities of Daily Living In your present state of health, do you have any difficulty performing the following activities: 09/11/2020  Hearing? N  Vision? N  Difficulty concentrating or making decisions? N  Walking or climbing stairs? N  Dressing or bathing? N  Doing errands, shopping? N  Preparing Food and eating ? N  Using the Toilet? N  In the past six months, have you accidently leaked urine? N  Do you have problems with loss of bowel control? N  Managing your Medications? N  Managing your Finances? N  Housekeeping or managing your Housekeeping? N  Some recent data might be hidden    Patient Care Team: Tower, Wynelle Fanny, MD as PCP - General Byrnett, Forest Gleason, MD (General Surgery) Tower, Wynelle Fanny, MD as Consulting Physician (Family Medicine) Forest Gleason, MD  (Inactive) as Consulting Physician (Oncology)  Indicate any recent Medical Services you may have received from other than [facility] providers in the past year (date may be approximate).     Assessment:   This is a routine wellness examination for Ray.  Hearing/Vision screen  Hearing Screening   125Hz  250Hz  500Hz  1000Hz  2000Hz  3000Hz  4000Hz  6000Hz  8000Hz   Right ear:           Left ear:           Vision Screening Comments: Advised patient gets annual eye exams  Dietary issues and exercise activities discussed: Current Exercise Habits: The patient does not participate in regular exercise at present, Exercise limited by: None identified  Goals    . Increase water intake     Starting 08/01/2018, I will attempt to drink at least 6-8 glasses of water daily.     . Patient Stated     09/11/2020, I will maintain and continue medications as prescribed.       Depression Screen PHQ 2/9 Scores 09/11/2020 08/14/2019 08/01/2018 07/26/2017 07/14/2016 12/30/2014 12/27/2013  PHQ - 2 Score 0 0 0 0 0 0 0  PHQ- 9 Score 0 - 0 0 - - -    Fall Risk Fall Risk  09/11/2020 08/14/2019 08/01/2018 07/26/2017 07/14/2016  Falls in the past year? 1 1 No No Yes  Comment fell out of chair - - - accident fall without injury caused by trip on throw rug  Number falls in past yr: 0 0 - - 1  Injury with Fall? 0 1 - - No  Risk for fall due to : No Fall Risks - - - -  Follow up Falls evaluation completed;Falls prevention discussed Falls evaluation completed - - Falls evaluation completed    Any stairs in or around the home? Yes  If so, are there any without handrails? No  Home free of loose throw rugs in walkways, pet beds, electrical cords, etc? Yes  Adequate lighting in your home to reduce risk of falls? Yes   ASSISTIVE DEVICES UTILIZED  TO PREVENT FALLS:  Life alert? No  Use of a cane, walker or w/c? No  Grab bars in the bathroom? No  Shower chair or bench in shower? No  Elevated toilet seat or a handicapped toilet?  No   TIMED UP AND GO:  Was the test performed? N/A, telephone visit .   Cognitive Function: MMSE - Mini Mental State Exam 09/11/2020 08/01/2018 07/26/2017 07/14/2016  Not completed: Refused - - -  Orientation to time - 5 5 5   Orientation to Place - 5 5 5   Registration - 3 3 3   Attention/ Calculation - 0 0 0  Recall - 3 3 3   Language- name 2 objects - 0 0 0  Language- repeat - 1 1 1   Language- follow 3 step command - 3 3 3   Language- read & follow direction - 0 0 0  Write a sentence - 0 0 0  Copy design - 0 0 0  Total score - 20 20 20   Mini Cog  Mini-Cog screen was completed. Maximum score is 22. A value of 0 denotes this part of the MMSE was not completed or the patient failed this part of the Mini-Cog screening.       Immunizations Immunization History  Administered Date(s) Administered  . Fluad Quad(high Dose 65+) 08/14/2019  . Influenza Split 10/25/2012  . Influenza Whole 11/01/2003  . Influenza, High Dose Seasonal PF 09/28/2014, 08/27/2015  . Influenza,inj,Quad PF,6+ Mos 07/20/2016, 08/02/2017, 08/01/2018  . PFIZER SARS-COV-2 Vaccination 03/20/2020, 04/14/2020  . Pneumococcal Conjugate-13 12/30/2014  . Pneumococcal Polysaccharide-23 12/27/2013  . Td 12/01/2002, 10/09/2012  . Zoster 08/08/2015    TDAP status: Up to date Flu Vaccine status: due, will get at upcoming physical  Pneumococcal vaccine status: Up to date Covid-19 vaccine status: Completed vaccines  Qualifies for Shingles Vaccine? Yes   Zostavax completed Yes   Shingrix Completed?: No.    Education has been provided regarding the importance of this vaccine. Patient has been advised to call insurance company to determine out of pocket expense if they have not yet received this vaccine. Advised may also receive vaccine at local pharmacy or Health Dept. Verbalized acceptance and understanding.  Screening Tests Health Maintenance  Topic Date Due  . INFLUENZA VACCINE  05/31/2020  . COLONOSCOPY  09/11/2021  (Originally 05/25/2020)  . MAMMOGRAM  09/10/2022  . TETANUS/TDAP  10/09/2022  . DEXA SCAN  Completed  . COVID-19 Vaccine  Completed  . Hepatitis C Screening  Completed  . PNA vac Low Risk Adult  Completed    Health Maintenance  Health Maintenance Due  Topic Date Due  . INFLUENZA VACCINE  05/31/2020    Colorectal cancer screening: declined Mammogram status: Completed Unilateral left 09/10/2020. Repeat every year Bone Density status: Completed 12/30/2019. Results reflect: Bone density results: OSTEOPOROSIS. Repeat every 1 years. Scheduled 12/29/2020  Lung Cancer Screening: (Low Dose CT Chest recommended if Age 20-80 years, 30 pack-year currently smoking OR have quit w/in 15years.) does not qualify.    Additional Screening:  Hepatitis C Screening: does qualify; Completed 07/26/2017  Vision Screening: Recommended annual ophthalmology exams for early detection of glaucoma and other disorders of the eye. Is the patient up to date with their annual eye exam?  No , Will schedule appointment in the future Who is the provider or what is the name of the office in which the patient attends annual eye exams? Does not have one right now If pt is not established with a provider, would they like to  be referred to a provider to establish care? No .   Dental Screening: Recommended annual dental exams for proper oral hygiene  Community Resource Referral / Chronic Care Management: CRR required this visit?  No   CCM required this visit?  No      Plan:     I have personally reviewed and noted the following in the patient's chart:   . Medical and social history . Use of alcohol, tobacco or illicit drugs  . Current medications and supplements . Functional ability and status . Nutritional status . Physical activity . Advanced directives . List of other physicians . Hospitalizations, surgeries, and ER visits in previous 12 months . Vitals . Screenings to include cognitive, depression, and  falls . Referrals and appointments  In addition, I have reviewed and discussed with patient certain preventive protocols, quality metrics, and best practice recommendations. A written personalized care plan for preventive services as well as general preventive health recommendations were provided to patient.   Due to this being a telephonic visit, the after visit summary with patients personalized plan was offered to patient via office or my-chart.  Patient preferred to pick up at office at next visit or via mychart.   Andrez Grime, LPN   46/19/0122

## 2020-09-14 ENCOUNTER — Other Ambulatory Visit: Payer: Self-pay | Admitting: Oncology

## 2020-09-14 DIAGNOSIS — R928 Other abnormal and inconclusive findings on diagnostic imaging of breast: Secondary | ICD-10-CM

## 2020-09-18 ENCOUNTER — Encounter: Payer: PRIVATE HEALTH INSURANCE | Admitting: Family Medicine

## 2020-09-22 ENCOUNTER — Ambulatory Visit
Admission: RE | Admit: 2020-09-22 | Discharge: 2020-09-22 | Disposition: A | Discharge status: Home / Self Care | Payer: Medicare Other | Source: Ambulatory Visit | Attending: Oncology | Admitting: Oncology

## 2020-09-22 ENCOUNTER — Other Ambulatory Visit: Payer: Self-pay

## 2020-09-22 DIAGNOSIS — R928 Other abnormal and inconclusive findings on diagnostic imaging of breast: Secondary | ICD-10-CM | POA: Diagnosis not present

## 2020-09-23 ENCOUNTER — Encounter: Payer: Self-pay | Admitting: Family Medicine

## 2020-09-23 ENCOUNTER — Ambulatory Visit (INDEPENDENT_AMBULATORY_CARE_PROVIDER_SITE_OTHER): Payer: Medicare Other | Admitting: Family Medicine

## 2020-09-23 VITALS — BP 132/88 | HR 92 | Temp 96.9°F | Ht 62.5 in | Wt 119.5 lb

## 2020-09-23 DIAGNOSIS — Z23 Encounter for immunization: Secondary | ICD-10-CM

## 2020-09-23 DIAGNOSIS — R1032 Left lower quadrant pain: Secondary | ICD-10-CM | POA: Diagnosis not present

## 2020-09-23 DIAGNOSIS — R1031 Right lower quadrant pain: Secondary | ICD-10-CM | POA: Diagnosis not present

## 2020-09-23 DIAGNOSIS — G479 Sleep disorder, unspecified: Secondary | ICD-10-CM

## 2020-09-23 DIAGNOSIS — R7303 Prediabetes: Secondary | ICD-10-CM | POA: Diagnosis not present

## 2020-09-23 DIAGNOSIS — M810 Age-related osteoporosis without current pathological fracture: Secondary | ICD-10-CM | POA: Diagnosis not present

## 2020-09-23 DIAGNOSIS — Z1211 Encounter for screening for malignant neoplasm of colon: Secondary | ICD-10-CM

## 2020-09-23 DIAGNOSIS — E7800 Pure hypercholesterolemia, unspecified: Secondary | ICD-10-CM | POA: Diagnosis not present

## 2020-09-23 DIAGNOSIS — I10 Essential (primary) hypertension: Secondary | ICD-10-CM | POA: Diagnosis not present

## 2020-09-23 MED ORDER — CARVEDILOL 25 MG PO TABS
25.0000 mg | ORAL_TABLET | Freq: Two times a day (BID) | ORAL | 3 refills | Status: DC
Start: 2020-09-23 — End: 2022-08-10

## 2020-09-23 MED ORDER — MIRTAZAPINE 15 MG PO TABS
ORAL_TABLET | ORAL | 3 refills | Status: DC
Start: 2020-09-23 — End: 2021-11-03

## 2020-09-23 NOTE — Progress Notes (Signed)
Subjective:    Patient ID: Shannon Stephens, female    DOB: 1946/05/18, 74 y.o.   MRN: [id number]  This visit occurred during the SARS-CoV-2 public health emergency.  Safety protocols were in place, including screening questions prior to the visit, additional usage of staff PPE, and extensive cleaning of exam room while observing appropriate contact time as indicated for disinfecting solutions.    HPI Pt presents for annual f/u of chronic health problems   Wt Readings from Last 3 Encounters:  09/23/20 119 lb 8 oz (54.2 kg)  07/22/20 117 lb 8 oz (53.3 kg)  01/20/20 121 lb 6.4 oz (55.1 kg)  fairly stable -not much appetite (stress)  21.51 kg/m  She drinks at least one boost per day    Has had difficulty walking after a fall off of a chair  Her kitchen floor  Bruised pretty badly   Walks short distances  Hurts from groin to knees (mostly groin- both sides)  Has not had xrays  Uses heating pad  Back is ok   covid immunized  zostavax 10/16 - shingrix is not covered  Flu shot given today   Mammogram 11/21 - addn views were normal  Self breast exam -no lumps  Personal h/o breast cancer  Taking tamoxifen- she tolerates it / on 86 y now   dexa 3/21   Osteopenia forearm/history of OP on tamoxifen    Taking prolia twice years  Falls -one-falling off chair a year ago  [place] she knows of  Supplements -ca and D  D level is good in the 60s  Exercise -limited due to groin pain   Colonoscopy 7/16   Has not been to gyn   HTN bp is stable today  No cp or palpitations or headaches or edema  No side effects to medicines  BP Readings from Last 3 Encounters:  09/23/20 (!) 154/96  07/22/20 (!) 169/85  01/20/20 140/84   takes coreg 25 mg bid Gets very nervous walking in the door  Re check BP: 132/88    Does not check often at home-  If machine is accurate    Pulse Readings from Last 3 Encounters:  09/23/20 92  07/22/20 65  01/20/20 80   Hyperlipidemia Lab Results   Component Value Date   CHOL 197 09/11/2020   CHOL 211 (H) 08/07/2019   CHOL 237 (H) 08/01/2018   Lab Results  Component Value Date   HDL 75.40 09/11/2020   HDL 56.80 08/07/2019   HDL 70.50 08/01/2018   Lab Results  Component Value Date   LDLCALC 133 (H) 08/01/2018   LDLCALC 127 (H) 07/14/2016   LDLCALC 125 (H) 12/30/2014   Lab Results  Component Value Date   TRIG 280.0 (H) 09/11/2020   TRIG 218.0 (H) 08/07/2019   TRIG 166.0 (H) 08/01/2018   Lab Results  Component Value Date   CHOLHDL 3 09/11/2020   CHOLHDL 4 08/07/2019   CHOLHDL 3 08/01/2018   Lab Results  Component Value Date   LDLDIRECT 91.0 09/11/2020   LDLDIRECT 135.0 08/07/2019   LDLDIRECT 150.0 07/26/2017  not eating as much    Prediabetes Lab Results  Component Value Date   HGBA1C 5.2 09/11/2020    Sleep disorder -still problematic  Mirtazapine Klonopin   Alcohol intake -1 glass of wine per day max (not every day)  Lab Results  Component Value Date   WBC 4.6 09/11/2020   HGB 14.3 09/11/2020   HCT 42.4 09/11/2020   MCV 103.6 (  H) 09/11/2020   PLT 232.0 09/11/2020    Patient Active Problem List   Diagnosis Date Noted  . Bilateral groin pain 09/23/2020  . Osteoporosis 01/15/2019  . Leg pain 08/07/2018  . Encounter for routine gynecological examination 12/30/2014  . Malignant neoplasm of lower-inner quadrant of right breast of female, estrogen receptor positive ([place]) 08/25/2014  . Encounter for Medicare annual wellness exam 12/27/2013  . Colon cancer screening 12/27/2013  . Abnormal EKG 01/17/2012  . Prediabetes 05/13/2011  . Macrocytosis 05/13/2011  . Gynecological examination 05/13/2011  . Routine general medical examination at a health care facility 05/08/2011  . Rachel DISEASE, LUMBAR 02/28/2008  . LIVEDO RETICULARIS 02/28/2008  . Hyperlipidemia 01/12/2007  . Generalized anxiety disorder 01/12/2007  . Essential hypertension 01/12/2007  . ALLERGIC RHINITIS 01/12/2007  . Disturbance in  sleep behavior 01/12/2007   Past Medical History:  Diagnosis Date  . Allergic rhinitis   . Anxiety   . Breast cancer ([place]) 2015   RT MASTECTOMY  . Breast cancer of lower-inner quadrant of right female breast [facility]) November 2015   T1c, N0/ ER + ; PR+; Her 2 neu not overexpressed. Oncotype DX: 14 % recurrence risk w/ antiestrogen alone (low intermediate group).  . Burning sensation    chronic,  skin  . Chronic cervicitis    with squamous metaplasia  . History of ultrasound, pelvic 03/29/06   WNL  . Hyperlipidemia 2003  . Hypertension   . Menopause    Past Surgical History:  Procedure Laterality Date  . BREAST BIOPSY Right 2015   POS  . BREAST SURGERY Right 09/04/14   Mastectomy   . COLONOSCOPY  3/02   polyps  . COLONOSCOPY  05-26-15   Dr Deatra Ina, 3 mm tubular adenoma in the sigmoid. Repeat exam 2021.  Marland Kitchen DILATION AND CURETTAGE OF UTERUS  1987   for DQB  . MASTECTOMY Right 08/2014   BREAST CA  . OTHER SURGICAL HISTORY     Neg renal ultrasound   Social History   Tobacco Use  . Smoking status: Never Smoker  . Smokeless tobacco: Never Used  Vaping Use  . Vaping Use: Never used  Substance Use Topics  . Alcohol use: No    Alcohol/week: 0.0 standard drinks  . Drug use: No   Family History  Problem Relation Age of Onset  . Diabetes Father   . Hypertension Father   . Heart failure Father   . Coronary artery disease Father   . Hypertension Mother   . Colon cancer Neg Hx   . Colon polyps Neg Hx   . Pancreatic cancer Neg Hx   . Rectal cancer Neg Hx   . Stomach cancer Neg Hx   . Ulcerative colitis Neg Hx   . Esophageal cancer Neg Hx   . Crohn's disease Neg Hx   . Breast cancer Neg Hx    Allergies  Allergen Reactions  . Alendronate Sodium     FOSAMAX REACTION: body pain- severe all over  . Simvastatin     Muscle pain    Current Outpatient Medications on File Prior to Visit  Medication Sig Dispense Refill  . B Complex Vitamins (VITAMIN B COMPLEX PO) Take by mouth  daily.    . Calcium Carbonate-Vitamin D 600-400 MG-UNIT per tablet Take 2 tablets by mouth daily.      . clonazePAM (KLONOPIN) 1 MG tablet TAKE 1 TABLET BY MOUTH 3 TIMES DAILY AS NEEDED FOR ANXIETY. 90 tablet 3  . Multiple Vitamin (MULTIVITAMIN) tablet  Take 1 tablet by mouth daily.    . Potassium 95 MG TABS Take by mouth daily.    . tamoxifen (NOLVADEX) 20 MG tablet TAKE 1 TABLET BY MOUTH EVERY DAY 90 tablet 3   No current facility-administered medications on file prior to visit.     Review of Systems  Constitutional: Positive for fatigue. Negative for activity change, appetite change, fever and unexpected weight change.  HENT: Negative for congestion, ear pain, rhinorrhea, sinus pressure and sore throat.   Eyes: Negative for pain, redness and visual disturbance.  Respiratory: Negative for cough, shortness of breath and wheezing.   Cardiovascular: Negative for chest pain and palpitations.  Gastrointestinal: Negative for abdominal pain, blood in stool, constipation and diarrhea.  Endocrine: Negative for polydipsia and polyuria.  Genitourinary: Negative for dysuria, frequency and urgency.  Musculoskeletal: Negative for arthralgias, back pain and myalgias.  Skin: Negative for pallor and rash.  Allergic/Immunologic: Negative for environmental allergies.  Neurological: Negative for dizziness, syncope and headaches.  Hematological: Negative for adenopathy. Does not bruise/bleed easily.  Psychiatric/Behavioral: Positive for sleep disturbance. Negative for decreased concentration and dysphoric mood. The patient is nervous/anxious.        Objective:   Physical Exam Constitutional:      General: She is not in acute distress.    Appearance: Normal appearance. She is well-developed and normal weight. She is not ill-appearing or diaphoretic.  HENT:     Head: Normocephalic and atraumatic.     Right Ear: Tympanic membrane, ear canal and external ear normal.     Left Ear: Tympanic membrane, ear  canal and external ear normal.     Nose: Nose normal. No congestion.     Mouth/Throat:     Mouth: Mucous membranes are moist.     Pharynx: Oropharynx is clear. No posterior oropharyngeal erythema.  Eyes:     General: No scleral icterus.    Extraocular Movements: Extraocular movements intact.     Conjunctiva/sclera: Conjunctivae normal.     Pupils: Pupils are equal, round, and reactive to light.  Neck:     Thyroid: No thyromegaly.     Vascular: No carotid bruit or JVD.  Cardiovascular:     Rate and Rhythm: Normal rate and regular rhythm.     Pulses: Normal pulses.     Heart sounds: Normal heart sounds. No gallop.   Pulmonary:     Effort: Pulmonary effort is normal. No respiratory distress.     Breath sounds: Normal breath sounds. No wheezing.     Comments: Good air exch Chest:     Chest wall: No tenderness.  Abdominal:     General: Bowel sounds are normal. There is no distension or abdominal bruit.     Palpations: Abdomen is soft. There is no mass.     Tenderness: There is no abdominal tenderness.     Hernia: No hernia is present.  Genitourinary:    Comments: Breast exam left:No mass, nodules, thickening, tenderness, bulging, retraction, inflamation, nipple discharge or skin changes noted.  No axillary or clavicular LA.      Mastectomy site R- no lumps or abnormalities  Musculoskeletal:        General: No tenderness. Normal range of motion.     Cervical back: Normal range of motion and neck supple. No rigidity. No muscular tenderness.     Right lower leg: No edema.     Left lower leg: No edema.  Lymphadenopathy:     Cervical: No cervical adenopathy.  Skin:  General: Skin is warm and dry.     Coloration: Skin is not pale.     Findings: No erythema or rash.     Comments: Solar lentigines diffusely   Neurological:     Mental Status: She is alert. Mental status is at baseline.     Cranial Nerves: No cranial nerve deficit.     Motor: No abnormal muscle tone.      Coordination: Coordination normal.     Gait: Gait normal.     Deep Tendon Reflexes: Reflexes are normal and symmetric.  Psychiatric:        Mood and Affect: Mood normal.           Assessment & Plan:   Problem List Items Addressed This Visit      Cardiovascular and Mediastinum   Essential hypertension    bp in fair control at this time  BP Readings from Last 1 Encounters:  09/23/20 132/88   No changes needed Most recent labs reviewed  Disc lifstyle change with low sodium diet and exercise  Plan to continue coreg 25 mg bid       Relevant Medications   carvedilol (COREG) 25 MG tablet     Musculoskeletal and Integument   Osteoporosis    dexa 3/21 Taking tamoxifen  Taking prolia One fall , no fractures  Good vit D level  Limited exercise due to hip pain        Other   Hyperlipidemia    Disc goals for lipids and reasons to control them Rev last labs with pt Rev low sat fat diet in detail  LDL down to 91        Relevant Medications   carvedilol (COREG) 25 MG tablet   Disturbance in sleep behavior    Still problematic  Plans to continue mirtazapine and klonopin       Prediabetes    Lab Results  Component Value Date   HGBA1C 5.2 09/11/2020   Not eating as much  disc imp of low glycemic diet and wt loss to prevent DM2  Enc more protein in diet       Bilateral groin pain    Suspect hip problem  Will plan to return for hip xrays in the future        Other Visit Diagnoses    Need for influenza vaccination    -  Primary   Relevant Orders   Flu Vaccine QUAD High Dose(Fluad) (Completed)

## 2020-09-23 NOTE — Patient Instructions (Addendum)
Get a covid vaccine booster mid December or later   Call next week to get an appointment time to do some hip xrays   Labs look fairly stable   Makes sure you eat 3 meals per day with protein

## 2020-09-25 NOTE — Assessment & Plan Note (Signed)
bp in fair control at this time  BP Readings from Last 1 Encounters:  09/23/20 132/88   No changes needed Most recent labs reviewed  Disc lifstyle change with low sodium diet and exercise  Plan to continue coreg 25 mg bid

## 2020-09-25 NOTE — Assessment & Plan Note (Signed)
dexa 3/21 Taking tamoxifen  Taking prolia One fall , no fractures  Good vit D level  Limited exercise due to hip pain

## 2020-09-25 NOTE — Assessment & Plan Note (Signed)
Lab Results  Component Value Date   HGBA1C 5.2 09/11/2020   Not eating as much  disc imp of low glycemic diet and wt loss to prevent DM2  Enc more protein in diet

## 2020-09-25 NOTE — Assessment & Plan Note (Signed)
Disc goals for lipids and reasons to control them Rev last labs with pt Rev low sat fat diet in detail  LDL down to 91

## 2020-09-25 NOTE — Assessment & Plan Note (Signed)
Still problematic  Plans to continue mirtazapine and klonopin

## 2020-09-25 NOTE — Assessment & Plan Note (Signed)
Suspect hip problem  Will plan to return for hip xrays in the future

## 2020-09-30 ENCOUNTER — Ambulatory Visit (INDEPENDENT_AMBULATORY_CARE_PROVIDER_SITE_OTHER)
Admission: RE | Admit: 2020-09-30 | Discharge: 2020-09-30 | Disposition: A | Discharge status: Home / Self Care | Payer: Medicare Other | Source: Ambulatory Visit | Attending: Family Medicine | Admitting: Family Medicine

## 2020-09-30 ENCOUNTER — Other Ambulatory Visit: Payer: PRIVATE HEALTH INSURANCE

## 2020-09-30 ENCOUNTER — Telehealth: Payer: Self-pay | Admitting: Family Medicine

## 2020-09-30 DIAGNOSIS — R1032 Left lower quadrant pain: Secondary | ICD-10-CM | POA: Diagnosis not present

## 2020-09-30 DIAGNOSIS — R1031 Right lower quadrant pain: Secondary | ICD-10-CM

## 2020-09-30 DIAGNOSIS — M1711 Unilateral primary osteoarthritis, right knee: Secondary | ICD-10-CM | POA: Diagnosis not present

## 2020-09-30 DIAGNOSIS — M1712 Unilateral primary osteoarthritis, left knee: Secondary | ICD-10-CM | POA: Diagnosis not present

## 2020-09-30 NOTE — Telephone Encounter (Signed)
Order for hip films for groin pain

## 2020-10-01 ENCOUNTER — Telehealth: Payer: Self-pay | Admitting: *Deleted

## 2020-10-01 ENCOUNTER — Telehealth: Payer: Self-pay | Admitting: Family Medicine

## 2020-10-01 DIAGNOSIS — M160 Bilateral primary osteoarthritis of hip: Secondary | ICD-10-CM

## 2020-10-01 DIAGNOSIS — M169 Osteoarthritis of hip, unspecified: Secondary | ICD-10-CM | POA: Insufficient documentation

## 2020-10-01 NOTE — Telephone Encounter (Signed)
-----   Message from Abner Greenspan, MD sent at 10/01/2020  8:30 AM EST ----- Significant arthritis in both hips-that explains her symptoms  I want to refer to orthopedics for eval/tx  Does she pref [place] or [place]?

## 2020-10-01 NOTE — Telephone Encounter (Signed)
Left VM requesting pt to call the office back 

## 2020-10-01 NOTE — Telephone Encounter (Signed)
-----   Message from Tammi Sou, [place] sent at 10/01/2020  3:30 PM EST ----- Pt notified of Xray results and Dr. Marliss Coots comments pt does want referral to Ortho, pt would like to see someone in [place], pt advise PCP would put referral in and our [facility] will call to set up appt

## 2020-10-01 NOTE — Telephone Encounter (Signed)
Addressed through results note  

## 2020-10-06 ENCOUNTER — Telehealth: Payer: Self-pay | Admitting: Family Medicine

## 2020-10-06 ENCOUNTER — Telehealth: Payer: Self-pay

## 2020-10-06 NOTE — Telephone Encounter (Signed)
LVM for pt to call clinic, her xray disc is ready to be picked up

## 2020-10-06 NOTE — Telephone Encounter (Signed)
LVM for pt to call clinic, her xray disc is ready for pick up

## 2020-10-06 NOTE — Telephone Encounter (Signed)
Will route to Shannon Stephens/ [name] to f/u with making a copy

## 2020-10-06 NOTE — Telephone Encounter (Signed)
Burning disc now, I will call pt and inform her that it is ready for pick up

## 2020-10-06 NOTE — Telephone Encounter (Signed)
PT CALLED IN DUE TO WHEN SHE TRIED TO MAKE THE APT WITH [facility] THEY TOLD HER THAT SHE WOULD NEED TO GET THE COPY OF X-RAYS BEFORE MAKING THE APT. AND SHE SPOKE WITH STEPHANIE   PLEASE ADVISE

## 2020-10-12 DIAGNOSIS — M5416 Radiculopathy, lumbar region: Secondary | ICD-10-CM | POA: Diagnosis not present

## 2020-10-12 DIAGNOSIS — M160 Bilateral primary osteoarthritis of hip: Secondary | ICD-10-CM | POA: Diagnosis not present

## 2020-12-05 ENCOUNTER — Other Ambulatory Visit: Payer: Self-pay | Admitting: Family Medicine

## 2020-12-07 NOTE — Telephone Encounter (Signed)
Name of [place] Name of Pharmacy:[facility] [address] [address] or Written Date and Quantity:05/21/20#90 tabs with 3refills Last Office Visit and Type:09/23/20 CPE Next Office Visit and Type:none scheduled

## 2020-12-29 ENCOUNTER — Ambulatory Visit
Admission: RE | Admit: 2020-12-29 | Discharge: 2020-12-29 | Disposition: A | Discharge status: Home / Self Care | Payer: Medicare Other | Source: Ambulatory Visit | Attending: Oncology | Admitting: Oncology

## 2020-12-29 ENCOUNTER — Other Ambulatory Visit: Payer: Self-pay

## 2020-12-29 DIAGNOSIS — M85851 Other specified disorders of bone density and structure, right thigh: Secondary | ICD-10-CM | POA: Diagnosis not present

## 2020-12-29 DIAGNOSIS — C50311 Malignant neoplasm of lower-inner quadrant of right female breast: Secondary | ICD-10-CM | POA: Insufficient documentation

## 2020-12-29 DIAGNOSIS — M810 Age-related osteoporosis without current pathological fracture: Secondary | ICD-10-CM | POA: Diagnosis not present

## 2020-12-29 DIAGNOSIS — Z170 Estrogen receptor positive status [ER+]: Secondary | ICD-10-CM | POA: Diagnosis not present

## 2021-01-15 NOTE — Progress Notes (Signed)
Shannon Stephens  [phone]) [phone]  [phone]     Shannon Stephens DOB: 1946-02-09  MR#: [id number]  NID#:[id number]  Patient Care Team: Abner Greenspan, MD as PCP - General Byrnett, Forest Gleason, MD (General Surgery) Tower, Wynelle Fanny, MD as Consulting Physician (Family Medicine) Forest Gleason, MD (Inactive) as Consulting Physician (Oncology)  CHIEF COMPLAINT: Pathologic stage Ia adenocarcinoma of the lower inner quadrant of the right breast.  INTERVAL HISTORY: Patient returns to clinic today for routine 86-month evaluation and continuation of Prolia and tamoxifen.  She continues to tolerate her treatments well without significant side effects.  She continues to have a poor appetite and mild weight loss.  She otherwise feels well.  She has no neurologic complaints. She denies any recent fevers or illnesses.  She denies any chest pain, shortness of breath, cough, or hemoptysis.  She denies any nausea, vomiting, constipation, or diarrhea. She has no urinary complaints.  Patient offers no further specific complaints today.  REVIEW OF SYSTEMS:   Review of Systems  Constitutional: Positive for weight loss. Negative for fever and malaise/fatigue.  Respiratory: Negative.  Negative for cough and shortness of breath.   Cardiovascular: Negative.  Negative for chest pain and leg swelling.  Gastrointestinal: Negative.  Negative for abdominal pain.  Genitourinary: Negative.  Negative for dysuria.  Musculoskeletal: Negative.  Negative for back pain.  Skin: Negative.  Negative for rash.  Neurological: Negative.  Negative for dizziness, sensory change, focal weakness, weakness and headaches.  Psychiatric/Behavioral: Negative.  The patient is not nervous/anxious.     As per HPI. Otherwise, a complete review of systems is negative.  ONCOLOGY HISTORY: Oncology History Overview Note  Carcinoma of breast at 4:00 position at the right breast status post simple mastectomy and anterior lymph  node evaluation  Right simple mastectomy done on September 04, 2014 T1c N0 M0 tumor. estrogen receptor positive progesterone receptor positive HER-2 receptor not overexpressed.  Oncotype DX score 22. 10 year distant recurrence rate 14% with tamoxifen alone.  Patient started on letrozole, 2015.   Malignant neoplasm of lower-inner quadrant of right breast of female, estrogen receptor positive ([place])  08/25/2014 Initial Diagnosis   Malignant neoplasm of right breast [facility])     PAST MEDICAL HISTORY: Past Medical History:  Diagnosis Date  . Allergic rhinitis   . Anxiety   . Breast cancer ([place]) 2015   RT MASTECTOMY  . Breast cancer of lower-inner quadrant of right female breast [facility]) November 2015   T1c, N0/ ER + ; PR+; Her 2 neu not overexpressed. Oncotype DX: 14 % recurrence risk w/ antiestrogen alone (low intermediate group).  . Burning sensation    chronic,  skin  . Chronic cervicitis    with squamous metaplasia  . History of ultrasound, pelvic 03/29/06   WNL  . Hyperlipidemia 2003  . Hypertension   . Menopause     PAST SURGICAL HISTORY: Past Surgical History:  Procedure Laterality Date  . BREAST BIOPSY Right 2015   POS  . BREAST SURGERY Right 09/04/14   Mastectomy   . COLONOSCOPY  3/02   polyps  . COLONOSCOPY  05-26-15   Dr Deatra Ina, 3 mm tubular adenoma in the sigmoid. Repeat exam 2021.  Marland Kitchen DILATION AND CURETTAGE OF UTERUS  1987   for DQB  . MASTECTOMY Right 08/2014   BREAST CA  . OTHER SURGICAL HISTORY     Neg renal ultrasound    FAMILY HISTORY Family History  Problem Relation Age of Onset  .  Diabetes Father   . Hypertension Father   . Heart failure Father   . Coronary artery disease Father   . Hypertension Mother   . Colon cancer Neg Hx   . Colon polyps Neg Hx   . Pancreatic cancer Neg Hx   . Rectal cancer Neg Hx   . Stomach cancer Neg Hx   . Ulcerative colitis Neg Hx   . Esophageal cancer Neg Hx   . Crohn's disease Neg Hx   . Breast cancer Neg Hx      GYNECOLOGIC HISTORY:  No LMP recorded. Patient is postmenopausal.     ADVANCED DIRECTIVES:    HEALTH MAINTENANCE: Social History   Tobacco Use  . Smoking status: Never Smoker  . Smokeless tobacco: Never Used  Vaping Use  . Vaping Use: Never used  Substance Use Topics  . Alcohol use: No    Alcohol/week: 0.0 standard drinks  . Drug use: No     Colonoscopy:  PAP:  Bone density:  Mammogram:  Allergies  Allergen Reactions  . Alendronate Sodium     FOSAMAX REACTION: body pain- severe all over  . Simvastatin     Muscle pain     Current Outpatient Medications  Medication Sig Dispense Refill  . B Complex Vitamins (VITAMIN B COMPLEX PO) Take by mouth daily.    . Calcium Carbonate-Vitamin D 600-400 MG-UNIT per tablet Take 2 tablets by mouth daily.    . carvedilol (COREG) 25 MG tablet Take 1 tablet (25 mg total) by mouth 2 (two) times daily. 180 tablet 3  . clonazePAM (KLONOPIN) 1 MG tablet TAKE 1 TABLET BY MOUTH THREE TIMES A DAY AS NEEDED FOR ANXIETY 90 tablet 3  . mirtazapine (REMERON) 15 MG tablet TAKE 1 TABLET BY MOUTH EVERYDAY AT BEDTIME 90 tablet 3  . Multiple Vitamin (MULTIVITAMIN) tablet Take 1 tablet by mouth daily.    . Potassium 95 MG TABS Take by mouth daily.    . tamoxifen (NOLVADEX) 20 MG tablet TAKE 1 TABLET BY MOUTH EVERY DAY 90 tablet 3   No current facility-administered medications for this visit.    OBJECTIVE: BP 131/63   Pulse 80   Temp (!) 97.3 F (36.3 C) (Tympanic)   Wt 116 lb 12.8 oz (53 kg)   SpO2 99%   BMI 21.02 kg/m    Body mass index is 21.02 kg/m.    ECOG FS:0 - Asymptomatic  General: Well-developed, well-nourished, no acute distress. Eyes: Pink conjunctiva, anicteric sclera. HEENT: Normocephalic, moist mucous membranes. Lungs: No audible wheezing or coughing. Heart: Regular rate and rhythm. Abdomen: Soft, nontender, no obvious distention. Musculoskeletal: No edema, cyanosis, or clubbing. Neuro: Alert, answering all questions  appropriately. Cranial nerves grossly intact. Skin: No rashes or petechiae noted. Psych: Normal affect.  LAB RESULTS:  Appointment on 01/20/2021  Component Date Value Ref Range Status  . Sodium 01/20/2021 139  135 - 145 mmol/L Final  . Potassium 01/20/2021 3.5  3.5 - 5.1 mmol/L Final  . Chloride 01/20/2021 101  98 - 111 mmol/L Final  . CO2 01/20/2021 30  22 - 32 mmol/L Final  . Glucose, Bld 01/20/2021 130* 70 - 99 mg/dL Final   Glucose reference range applies only to samples taken after fasting for at least 8 hours.  . BUN 01/20/2021 20  8 - 23 mg/dL Final  . Creatinine, Ser 01/20/2021 0.79  0.44 - 1.00 mg/dL Final  . Calcium 01/20/2021 9.3  8.9 - 10.3 mg/dL Final  . GFR, Estimated 01/20/2021 >60  >  60 mL/min Final   Comment: (NOTE) Calculated using the CKD-EPI Creatinine Equation (2021)   . Anion gap 01/20/2021 8  5 - 15 Final   Performed at [facility], [address] [place], [place] [address]    STUDIES: No results found.  ASSESSMENT: Pathologic stage Ia adenocarcinoma of the lower inner quadrant of the right breast.  PLAN:    1. Pathologic stage Ia adenocarcinoma of the lower inner quadrant of the right breast: Patient is status post right mastectomy in November 2015. Her Oncotype score of 22 was intermediate risk, but patient did not receive chemotherapy.  Letrozole was discontinued secondary to osteoporosis and patient was initiated on tamoxifen.  Given her intermediate risk score, patient agreed to take a 2 additional years of treatment completing in December 2022.  Her most recent mammogram on September 22, 2020 was reported as BI-RADS 1.  Repeat in November 2022.  Return to clinic in 6 months for routine evaluation.  2.  Osteoporosis: Patient's bone mineral density on December 27, 2018 reported T score of -2.9.  This was significantly worse than 2 years prior when her T score was only -1.2.  Her most recent bone mineral density on December 29, 2020 reported T score of  -2.5 which is improved from 2020, but essentially unchanged from 1 year prior. Patient reports she cannot tolerate Fosamax.  Proceed with Prolia today.  Continue calcium and vitamin D supplementation.  Return to clinic in 6 months as above for continuation of treatment.  Repeat bone mineral density in March 2023.  3.  Weight loss: Patient previously reported this was likely secondary to living alone and is likely dietary in nature.  She was offered a dietary referral which she declined.  Monitor.  Patient expressed understanding and was in agreement with this plan. She also understands that She can call clinic at any time with any questions, concerns, or complaints.     Lloyd Huger, MD   01/20/2021 12:05 PM

## 2021-01-19 ENCOUNTER — Other Ambulatory Visit: Payer: Self-pay | Admitting: *Deleted

## 2021-01-19 DIAGNOSIS — M810 Age-related osteoporosis without current pathological fracture: Secondary | ICD-10-CM

## 2021-01-20 ENCOUNTER — Inpatient Hospital Stay (HOSPITAL_BASED_OUTPATIENT_CLINIC_OR_DEPARTMENT_OTHER): Payer: Medicare Other | Admitting: Oncology

## 2021-01-20 ENCOUNTER — Inpatient Hospital Stay: Payer: Medicare Other | Attending: Oncology

## 2021-01-20 ENCOUNTER — Encounter: Payer: Self-pay | Admitting: Oncology

## 2021-01-20 ENCOUNTER — Inpatient Hospital Stay: Payer: Medicare Other

## 2021-01-20 ENCOUNTER — Other Ambulatory Visit: Payer: Self-pay

## 2021-01-20 VITALS — BP 131/63 | HR 80 | Temp 97.3°F | Wt 116.8 lb

## 2021-01-20 DIAGNOSIS — Z79810 Long term (current) use of selective estrogen receptor modulators (SERMs): Secondary | ICD-10-CM | POA: Insufficient documentation

## 2021-01-20 DIAGNOSIS — M810 Age-related osteoporosis without current pathological fracture: Secondary | ICD-10-CM | POA: Diagnosis not present

## 2021-01-20 DIAGNOSIS — C50311 Malignant neoplasm of lower-inner quadrant of right female breast: Secondary | ICD-10-CM | POA: Insufficient documentation

## 2021-01-20 DIAGNOSIS — Z170 Estrogen receptor positive status [ER+]: Secondary | ICD-10-CM | POA: Insufficient documentation

## 2021-01-20 DIAGNOSIS — R634 Abnormal weight loss: Secondary | ICD-10-CM | POA: Diagnosis not present

## 2021-01-20 DIAGNOSIS — R630 Anorexia: Secondary | ICD-10-CM | POA: Diagnosis not present

## 2021-01-20 LAB — BASIC METABOLIC PANEL
Anion gap: 8 (ref 5–15)
BUN: 20 mg/dL (ref 8–23)
CO2: 30 mmol/L (ref 22–32)
Calcium: 9.3 mg/dL (ref 8.9–10.3)
Chloride: 101 mmol/L (ref 98–111)
Creatinine, Ser: 0.79 mg/dL (ref 0.44–1.00)
GFR, Estimated: >60 mL/min (ref >60)
Glucose, Bld: 130 mg/dL — ABNORMAL HIGH (ref 70–99)
Potassium: 3.5 mmol/L (ref 3.5–5.1)
Sodium: 139 mmol/L (ref 135–145)

## 2021-01-20 MED ORDER — DENOSUMAB 60 MG/ML ~~LOC~~ SOSY
60.0000 mg | PREFILLED_SYRINGE | Freq: Once | SUBCUTANEOUS | Status: AC
  Administered 2021-01-20: 60 mg via SUBCUTANEOUS
  Filled 2021-01-20: qty 1

## 2021-03-17 ENCOUNTER — Other Ambulatory Visit: Payer: Self-pay | Admitting: Oncology

## 2021-06-08 ENCOUNTER — Other Ambulatory Visit: Payer: Self-pay | Admitting: Family Medicine

## 2021-06-09 NOTE — Telephone Encounter (Signed)
Name of Medication: [place] Name [facility] [address] [address] or Written Date and Quantity:12/07/20 #90 tabs with 3 refills Last Office Visit and Type: 09/23/20 CPE Next Office Visit and Type: none scheduled

## 2021-07-24 NOTE — Progress Notes (Addendum)
[name]  [phone]  [phone]     Shannon Stephens DOB: 04/13/1946  MR#: [id number]  AOZ#:[id number]  Patient Care Team: Abner Greenspan, MD as PCP - General Byrnett, Forest Gleason, MD (General Surgery) Tower, Wynelle Fanny, MD as Consulting Physician (Family Medicine) Forest Gleason, MD (Inactive) as Consulting Physician (Oncology)  CHIEF COMPLAINT: Pathologic stage Ia adenocarcinoma of the lower inner quadrant of the right breast.  INTERVAL HISTORY: Patient returns to clinic today for routine 72-month evaluation and continuation of Prolia and tamoxifen.  She currently feels well and is asymptomatic.  She is tolerating her treatments well without significant side effects.  She continues to complain of a poor appetite.  She has no neurologic complaints. She denies any recent fevers or illnesses.  She denies any chest pain, shortness of breath, cough, or hemoptysis.  She denies any nausea, vomiting, constipation, or diarrhea. She has no urinary complaints.  Patient offers no further specific complaints today.  REVIEW OF SYSTEMS:   Review of Systems  Constitutional: Negative.  Negative for fever, malaise/fatigue and weight loss.  Respiratory: Negative.  Negative for cough and shortness of breath.   Cardiovascular: Negative.  Negative for chest pain and leg swelling.  Gastrointestinal: Negative.  Negative for abdominal pain.  Genitourinary: Negative.  Negative for dysuria.  Musculoskeletal: Negative.  Negative for back pain.  Skin: Negative.  Negative for rash.  Neurological: Negative.  Negative for dizziness, sensory change, focal weakness, weakness and headaches.  Psychiatric/Behavioral: Negative.  The patient is not nervous/anxious.    As per HPI. Otherwise, a complete review of systems is negative.  ONCOLOGY HISTORY: Oncology History Overview Note  Carcinoma of breast at 4:00 position at the right breast status post simple mastectomy and anterior lymph node  evaluation  Right simple mastectomy done on September 04, 2014 T1c N0 M0 tumor. estrogen receptor positive progesterone receptor positive HER-2 receptor not overexpressed.  Oncotype DX score 22.  10 year distant recurrence rate 14% with tamoxifen alone.  Patient started on letrozole, 2015.   Malignant neoplasm of lower-inner quadrant of right breast of female, estrogen receptor positive ([place])  08/25/2014 Initial Diagnosis   Malignant neoplasm of right breast [facility])     PAST MEDICAL HISTORY: Past Medical History:  Diagnosis Date   Allergic rhinitis    Anxiety    Breast cancer ([place]) 2015   RT MASTECTOMY   Breast cancer of lower-inner quadrant of right female breast [facility]) November 2015   T1c, N0/ ER + ; PR+; Her 2 neu not overexpressed. Oncotype DX: 14 % recurrence risk w/ antiestrogen alone (low intermediate group).   Burning sensation    chronic,  skin   Chronic cervicitis    with squamous metaplasia   History of ultrasound, pelvic 03/29/06   WNL   Hyperlipidemia 2003   Hypertension    Menopause     PAST SURGICAL HISTORY: Past Surgical History:  Procedure Laterality Date   BREAST BIOPSY Right 2015   POS   BREAST SURGERY Right 09/04/14   Mastectomy    COLONOSCOPY  3/02   polyps   COLONOSCOPY  05-26-15   Dr Deatra Ina, 3 mm tubular adenoma in the sigmoid. Repeat exam 2021.   DILATION AND CURETTAGE OF UTERUS  1987   for DQB   MASTECTOMY Right 08/2014   BREAST CA   OTHER SURGICAL HISTORY     Neg renal ultrasound    FAMILY HISTORY Family History  Problem Relation Age of Onset   Diabetes  Father    Hypertension Father    Heart failure Father    Coronary artery disease Father    Hypertension Mother    Colon cancer Neg Hx    Colon polyps Neg Hx    Pancreatic cancer Neg Hx    Rectal cancer Neg Hx    Stomach cancer Neg Hx    Ulcerative colitis Neg Hx    Esophageal cancer Neg Hx    Crohn's disease Neg Hx    Breast cancer Neg Hx     GYNECOLOGIC HISTORY:  No LMP  recorded. Patient is postmenopausal.     ADVANCED DIRECTIVES:    HEALTH MAINTENANCE: Social History   Tobacco Use   Smoking status: Never   Smokeless tobacco: Never  Vaping Use   Vaping Use: Never used  Substance Use Topics   Alcohol use: No    Alcohol/week: 0.0 standard drinks   Drug use: No     Colonoscopy:  PAP:  Bone density:  Mammogram:  Allergies  Allergen Reactions   Alendronate Sodium     FOSAMAX REACTION: body pain- severe all over   Simvastatin     Muscle pain     Current Outpatient Medications  Medication Sig Dispense Refill   B Complex Vitamins (VITAMIN B COMPLEX PO) Take by mouth daily.     Calcium Carbonate-Vitamin D 600-400 MG-UNIT per tablet Take 2 tablets by mouth daily.     carvedilol (COREG) 25 MG tablet Take 1 tablet (25 mg total) by mouth 2 (two) times daily. 180 tablet 3   clonazePAM (KLONOPIN) 1 MG tablet TAKE 1 TABLET BY MOUTH THREE TIMES A DAY AS NEEDED FOR ANXIETY 90 tablet 3   mirtazapine (REMERON) 15 MG tablet TAKE 1 TABLET BY MOUTH EVERYDAY AT BEDTIME 90 tablet 3   Multiple Vitamin (MULTIVITAMIN) tablet Take 1 tablet by mouth daily.     Potassium 95 MG TABS Take by mouth daily.     tamoxifen (NOLVADEX) 20 MG tablet TAKE 1 TABLET BY MOUTH EVERY DAY 90 tablet 1   No current facility-administered medications for this visit.    OBJECTIVE: BP (!) 167/87   Pulse (!) 18   Temp 97.9 F (36.6 C)   Resp (!) 80   Wt 114 lb 14.4 oz (52.1 kg)   SpO2 100%   BMI 20.68 kg/m    Body mass index is 20.68 kg/m.    ECOG FS:0 - Asymptomatic  General: Well-developed, well-nourished, no acute distress. Eyes: Pink conjunctiva, anicteric sclera. HEENT: Normocephalic, moist mucous membranes. Breast: Exam deferred today. Lungs: No audible wheezing or coughing. Heart: Regular rate and rhythm. Abdomen: Soft, nontender, no obvious distention. Musculoskeletal: No edema, cyanosis, or clubbing. Neuro: Alert, answering all questions appropriately. Cranial  nerves grossly intact. Skin: No rashes or petechiae noted. Psych: Normal affect.   LAB RESULTS:  Appointment on 07/28/2021  Component Date Value Ref Range Status   Sodium 07/28/2021 140  135 - 145 mmol/L Final   Potassium 07/28/2021 3.6  3.5 - 5.1 mmol/L Final   Chloride 07/28/2021 103  98 - 111 mmol/L Final   CO2 07/28/2021 30  22 - 32 mmol/L Final   Glucose, Bld 07/28/2021 126 (A) 70 - 99 mg/dL Final   Glucose reference range applies only to samples taken after fasting for at least 8 hours.   BUN 07/28/2021 21  8 - 23 mg/dL Final   Creatinine, Ser 07/28/2021 0.83  0.44 - 1.00 mg/dL Final   Calcium 07/28/2021 9.4  8.9 - 10.3  mg/dL Final   GFR, Estimated 07/28/2021 >60  >60 mL/min Final   Comment: (NOTE) Calculated using the CKD-EPI Creatinine Equation (2021)    Anion gap 07/28/2021 7  5 - 15 Final   Performed at [facility], [address] [place], [place] [address]    STUDIES: No results found.  ASSESSMENT: Pathologic stage Ia adenocarcinoma of the lower inner quadrant of the right breast.  PLAN:    1. Pathologic stage Ia adenocarcinoma of the lower inner quadrant of the right breast: Patient is status post right mastectomy in November 2015. Her Oncotype score of 22 was intermediate risk, but patient did not receive chemotherapy.  Letrozole was discontinued secondary to osteoporosis and patient was initiated on tamoxifen.  Given her intermediate risk score, patient agreed to take a 2 additional years of treatment and will complete at the end of 2022.  Her most recent mammogram on September 22, 2020 was reported as BI-RADS 1.  Repeat in November 2022.  Return to clinic in 6 months for routine evaluation. 2.  Osteoporosis: Patient's bone mineral density on December 27, 2018 reported T score of -2.9.  This was significantly worse than 2 years prior when her T score was only -1.2.  Her most recent bone mineral density on December 29, 2020 reported T score of -2.5 which is improved  from 2020, but essentially unchanged from 1 year prior. Patient reports she cannot tolerate Fosamax.  Proceed with Prolia today.  Continue calcium and vitamin D supplementation.  Return to clinic in 6 months with repeat bone mineral density, further evaluation, and continuation of Prolia.   3.  Poor appetite: Patient was previously offered a referral to dietary which she declined.  I spent a total of 30 minutes reviewing chart data, face-to-face evaluation with the patient, counseling and coordination of care as detailed above.  Patient expressed understanding and was in agreement with this plan. She also understands that She can call clinic at any time with any questions, concerns, or complaints.     Lloyd Huger, MD   07/30/2021 2:35 PM

## 2021-07-28 ENCOUNTER — Inpatient Hospital Stay: Payer: Medicare Other | Attending: Oncology

## 2021-07-28 ENCOUNTER — Other Ambulatory Visit: Payer: Self-pay

## 2021-07-28 ENCOUNTER — Inpatient Hospital Stay: Payer: Medicare Other

## 2021-07-28 ENCOUNTER — Inpatient Hospital Stay (HOSPITAL_BASED_OUTPATIENT_CLINIC_OR_DEPARTMENT_OTHER): Payer: Medicare Other | Admitting: Oncology

## 2021-07-28 VITALS — BP 167/87 | HR 18 | Temp 97.9°F | Resp 80 | Wt 114.9 lb

## 2021-07-28 DIAGNOSIS — Z170 Estrogen receptor positive status [ER+]: Secondary | ICD-10-CM

## 2021-07-28 DIAGNOSIS — Z139 Encounter for screening, unspecified: Secondary | ICD-10-CM

## 2021-07-28 DIAGNOSIS — Z79810 Long term (current) use of selective estrogen receptor modulators (SERMs): Secondary | ICD-10-CM | POA: Diagnosis not present

## 2021-07-28 DIAGNOSIS — C50311 Malignant neoplasm of lower-inner quadrant of right female breast: Secondary | ICD-10-CM | POA: Insufficient documentation

## 2021-07-28 DIAGNOSIS — M810 Age-related osteoporosis without current pathological fracture: Secondary | ICD-10-CM | POA: Diagnosis not present

## 2021-07-28 DIAGNOSIS — Z1231 Encounter for screening mammogram for malignant neoplasm of breast: Secondary | ICD-10-CM

## 2021-07-28 LAB — BASIC METABOLIC PANEL
Anion gap: 7 (ref 5–15)
BUN: 21 mg/dL (ref 8–23)
CO2: 30 mmol/L (ref 22–32)
Calcium: 9.4 mg/dL (ref 8.9–10.3)
Chloride: 103 mmol/L (ref 98–111)
Creatinine, Ser: 0.83 mg/dL (ref 0.44–1.00)
GFR, Estimated: >60 mL/min (ref >60)
Glucose, Bld: 126 mg/dL — ABNORMAL HIGH (ref 70–99)
Potassium: 3.6 mmol/L (ref 3.5–5.1)
Sodium: 140 mmol/L (ref 135–145)

## 2021-07-28 MED ORDER — DENOSUMAB 60 MG/ML ~~LOC~~ SOSY
60.0000 mg | PREFILLED_SYRINGE | Freq: Once | SUBCUTANEOUS | Status: AC
  Administered 2021-07-28: 60 mg via SUBCUTANEOUS
  Filled 2021-07-28: qty 1

## 2021-07-28 NOTE — Progress Notes (Signed)
Pt reports decreased appetite after recent fall. Pt states she does drink boost/ensure to supplement but is requesting something to help increase her appetite.

## 2021-07-30 ENCOUNTER — Encounter: Payer: Self-pay | Admitting: Oncology

## 2021-07-30 NOTE — Addendum Note (Signed)
Addended by: Lloyd Huger on: 07/30/2021 02:39 PM   Modules accepted: Level of Service

## 2021-09-11 ENCOUNTER — Other Ambulatory Visit: Payer: Self-pay | Admitting: Oncology

## 2021-09-12 NOTE — Progress Notes (Signed)
Subjective:   Shannon Stephens is a 75 y.o. female who presents for Medicare Annual (Subsequent) preventive examination.  I connected with Cathleen Fears today by telephone and verified that I am speaking with the correct person using two identifiers. Location patient: home Location provider: work Persons participating in the virtual visit: patient, Marine scientist.    I discussed the limitations, risks, security and privacy concerns of performing an evaluation and management service by telephone and the availability of in person appointments. I also discussed with the patient that there may be a patient responsible charge related to this service. The patient expressed understanding and verbally consented to this telephonic visit.    Interactive audio and video telecommunications were attempted between this provider and patient, however failed, due to patient having technical difficulties OR patient did not have access to video capability.  We continued and completed visit with audio only.  Some vital signs may be absent or patient reported.   Time Spent with patient on telephone encounter: 35 minutes  Review of Systems     Cardiac Risk Factors include: advanced age (>32men, >61 women);hypertension;dyslipidemia     Objective:    Today's Vitals   09/14/21 1443 09/14/21 1444  Weight: 114 lb (51.7 kg)   Height: 5\' 2"  (1.575 m)   PainSc:  2    Body mass index is 20.85 kg/m.  Advanced Directives 09/14/2021 07/28/2021 01/20/2021 09/11/2020 01/20/2020 07/22/2019 01/17/2019  Does Patient Have a Medical Advance Directive? No No No Yes Yes Yes Yes  Type of Advance Directive - - Public librarian;Living will [place];Living will Living will [place]  Does patient want to make changes to medical advance directive? - No - Patient declined - - No - Patient declined No - Patient declined Yes (Inpatient - patient requests chaplain consult to change a medical  advance directive)  Copy of [place] in Chart? - - - No - copy requested No - copy requested - -  Would patient like information on creating a medical advance directive? Yes (MAU/Ambulatory/Procedural Areas - Information given) No - Patient declined - - - - -    Current Medications (verified) Outpatient Encounter Medications as of 09/14/2021  Medication Sig   B Complex Vitamins (VITAMIN B COMPLEX PO) Take by mouth daily.   Calcium Carbonate-Vitamin D 600-400 MG-UNIT per tablet Take 2 tablets by mouth daily.   carvedilol (COREG) 25 MG tablet Take 1 tablet (25 mg total) by mouth 2 (two) times daily.   clonazePAM (KLONOPIN) 1 MG tablet TAKE 1 TABLET BY MOUTH THREE TIMES A DAY AS NEEDED FOR ANXIETY   mirtazapine (REMERON) 15 MG tablet TAKE 1 TABLET BY MOUTH EVERYDAY AT BEDTIME   Multiple Vitamin (MULTIVITAMIN) tablet Take 1 tablet by mouth daily.   Potassium 95 MG TABS Take by mouth daily.   tamoxifen (NOLVADEX) 20 MG tablet TAKE 1 TABLET BY MOUTH EVERY DAY   [DISCONTINUED] tamoxifen (NOLVADEX) 20 MG tablet TAKE 1 TABLET BY MOUTH EVERY DAY   No facility-administered encounter medications on file as of 09/14/2021.    Allergies (verified) Alendronate sodium and Simvastatin   History: Past Medical History:  Diagnosis Date   Allergic rhinitis    Anxiety    Breast cancer ([place]) 2015   RT MASTECTOMY   Breast cancer of lower-inner quadrant of right female breast [facility]) November 2015   T1c, N0/ ER + ; PR+; Her 2 neu not overexpressed. Oncotype DX: 14 % recurrence  risk w/ antiestrogen alone (low intermediate group).   Burning sensation    chronic,  skin   Chronic cervicitis    with squamous metaplasia   History of ultrasound, pelvic 03/29/06   WNL   Hyperlipidemia 2003   Hypertension    Menopause    Past Surgical History:  Procedure Laterality Date   BREAST BIOPSY Right 2015   POS   BREAST SURGERY Right 09/04/14   Mastectomy    COLONOSCOPY  3/02   polyps    COLONOSCOPY  05-26-15   Dr Deatra Ina, 3 mm tubular adenoma in the sigmoid. Repeat exam 2021.   DILATION AND CURETTAGE OF UTERUS  1987   for DQB   MASTECTOMY Right 08/2014   BREAST CA   OTHER SURGICAL HISTORY     Neg renal ultrasound   Family History  Problem Relation Age of Onset   Diabetes Father    Hypertension Father    Heart failure Father    Coronary artery disease Father    Hypertension Mother    Colon cancer Neg Hx    Colon polyps Neg Hx    Pancreatic cancer Neg Hx    Rectal cancer Neg Hx    Stomach cancer Neg Hx    Ulcerative colitis Neg Hx    Esophageal cancer Neg Hx    Crohn's disease Neg Hx    Breast cancer Neg Hx    Social History   Socioeconomic History   Marital status: Widowed    Spouse name: Not on file   Number of children: 0   Years of education: Not on file   Highest education level: Not on file  Occupational History   Occupation: Herbalist  Tobacco Use   Smoking status: Never   Smokeless tobacco: Never  Vaping Use   Vaping Use: Never used  Substance and Sexual Activity   Alcohol use: Yes    Alcohol/week: 1.0 standard drink    Types: 1 Glasses of wine per week    Comment: at night   Drug use: No   Sexual activity: Yes  Other Topics Concern   Not on file  Social History Narrative   Husband had MI and surgery, lots of stress, husband has prostate ca   Has 2 sisters   Social Determinants of Health   Financial Resource Strain: Low Risk    Difficulty of Paying Living Expenses: Not hard at all  Food Insecurity: No Food Insecurity   Worried About Charity fundraiser in the Last Year: Never true   Ran Out of Food in the Last Year: Never true  Transportation Needs: No Transportation Needs   Lack of Transportation (Medical): No   Lack of Transportation (Non-Medical): No  Physical Activity: Insufficiently Active   Days of Exercise per Week: 3 days   Minutes of Exercise per Session: 10 min  Stress: No Stress Concern Present   Feeling of  Stress : Not at all  Social Connections: Moderately Isolated   Frequency of Communication with Friends and Family: Three times a week   Frequency of Social Gatherings with Friends and Family: Never   Attends Religious Services: 1 to 4 times per year   Active Member of [employer] or Organizations: No   Attends Archivist Meetings: Never   Marital Status: Widowed    Tobacco Counseling Counseling given: Not Answered   Clinical Intake:  Pre-visit preparation completed: Yes  Pain : 0-10 Pain Score: 2  Pain Location: Leg Pain Orientation: Right, Left  BMI - recorded: 20.85 Nutritional Status: BMI of 19-24  Normal Nutritional Risks: None Diabetes: No  How often do you need to have someone help you when you read instructions, pamphlets, or other written materials from your doctor or pharmacy?: 1 - Never  Diabetic?No  Interpreter Needed?: No  Information entered by :: Kelee Cunningham McCainLPN   Activities of Daily Living In your present state of health, do you have any difficulty performing the following activities: 09/14/2021  Hearing? N  Vision? N  Difficulty concentrating or making decisions? N  Walking or climbing stairs? Y  Comment uses cane  Dressing or bathing? N  Doing errands, shopping? Y  Comment having some diffculty shopping when using cane, has friend that helps  Preparing Food and eating ? N  Using the Toilet? N  In the past six months, have you accidently leaked urine? N  Do you have problems with loss of bowel control? N  Managing your Medications? N  Managing your Finances? N  Housekeeping or managing your Housekeeping? N  Some recent data might be hidden    Patient Care Team: Tower, Wynelle Fanny, MD as PCP - General Byrnett, Forest Gleason, MD (General Surgery) Tower, Wynelle Fanny, MD as Consulting Physician (Family Medicine) Forest Gleason, MD as Consulting Physician (Oncology)  Indicate any recent Medical Services you may have received from other than [facility]  providers in the past year (date may be approximate).     Assessment:   This is a routine wellness examination for [place].  Hearing/Vision screen Hearing Screening - Comments:: No issues Vision Screening - Comments:: Unsure of last eye exam  Dietary issues and exercise activities discussed: Current Exercise Habits: The patient does not participate in regular exercise at present;Home exercise routine, Type of exercise: stretching, Time (Minutes): 10, Frequency (Times/Week): 4, Weekly Exercise (Minutes/Week): 40, Intensity: Mild   Goals Addressed             This Visit's Progress    Patient Stated       Would like to work on increasing appetite       Depression Screen PHQ 2/9 Scores 09/14/2021 09/11/2020 08/14/2019 08/01/2018 07/26/2017 07/14/2016 12/30/2014  PHQ - 2 Score 0 0 0 0 0 0 0  PHQ- 9 Score - 0 - 0 0 - -    Fall Risk Fall Risk  09/14/2021 09/11/2020 08/14/2019 08/01/2018 07/26/2017  Falls in the past year? 1 1 1  No No  Comment - fell out of chair - - -  Number falls in past yr: 0 0 0 - -  Injury with Fall? 1 0 1 - -  Risk for fall due to : Other (Comment) No Fall Risks - - -  Risk for fall due to: Comment fell off a chair - - - -  Follow up Falls prevention discussed Falls evaluation completed;Falls prevention discussed Falls evaluation completed - -    FALL RISK PREVENTION PERTAINING TO THE HOME:  Any stairs in or around the home? Yes  If so, are there any without handrails? No  Home free of loose throw rugs in walkways, pet beds, electrical cords, etc? No  Adequate lighting in your home to reduce risk of falls? Yes   ASSISTIVE DEVICES UTILIZED TO PREVENT FALLS:  Life alert? No  Use of a cane, walker or w/c? Yes , cane Grab bars in the bathroom? Yes  Shower chair or bench in shower? Yes  Elevated toilet seat or a handicapped toilet? No   TIMED UP AND GO:  Was the test performed? No , visit completed over the phone.    Cognitive Function: Normal cognitive  status assessed by this Nurse Health Advisor. No abnormalities found.   MMSE - Mini Mental State Exam 09/11/2020 08/01/2018 07/26/2017 07/14/2016  Not completed: Refused - - -  Orientation to time - 5 5 5   Orientation to Place - 5 5 5   Registration - 3 3 3   Attention/ Calculation - 0 0 0  Recall - 3 3 3   Language- name 2 objects - 0 0 0  Language- repeat - 1 1 1   Language- follow 3 step command - 3 3 3   Language- read & follow direction - 0 0 0  Write a sentence - 0 0 0  Copy design - 0 0 0  Total score - 20 20 20         Immunizations Immunization History  Administered Date(s) Administered   Fluad Quad(high Dose 65+) 08/14/2019, 09/23/2020   Influenza Split 10/25/2012   Influenza Whole 11/01/2003   Influenza, High Dose Seasonal PF 09/28/2014, 08/27/2015   Influenza,inj,Quad PF,6+ Mos 07/20/2016, 08/02/2017, 08/01/2018   PFIZER(Purple Top)SARS-COV-2 Vaccination 03/20/2020, 04/14/2020   Pneumococcal Conjugate-13 12/30/2014   Pneumococcal Polysaccharide-23 12/27/2013   Td 12/01/2002, 10/09/2012   Zoster, Live 08/08/2015    TDAP status: Up to date  Flu Vaccine status: Due, Education has been provided regarding the importance of this vaccine. Advised may receive this vaccine at local pharmacy or Health Dept. Aware to provide a copy of the vaccination record if obtained from local pharmacy or Health Dept. Verbalized acceptance and understanding.  Pneumococcal vaccine status: Up to date  Covid-19 vaccine status: Information provided on how to obtain vaccines.   Qualifies for Shingles Vaccine? Yes   Zostavax completed Yes   Shingrix Completed?: No.    Education has been provided regarding the importance of this vaccine. Patient has been advised to call insurance company to determine out of pocket expense if they have not yet received this vaccine. Advised may also receive vaccine at local pharmacy or Health Dept. Verbalized acceptance and understanding.  Screening Tests Health  Maintenance  Topic Date Due   Zoster Vaccines- Shingrix (1 of 2) Never done   COVID-19 Vaccine (3 - Pfizer risk series) 05/12/2020   COLONOSCOPY (Pts 45-30yrs Insurance coverage will need to be confirmed)  05/25/2020   INFLUENZA VACCINE  05/31/2021   TETANUS/TDAP  10/09/2022   Pneumonia Vaccine 89+ Years old  Completed   DEXA SCAN  Completed   Hepatitis C Screening  Completed   HPV VACCINES  Aged Out    Health Maintenance  Health Maintenance Due  Topic Date Due   Zoster Vaccines- Shingrix (1 of 2) Never done   COVID-19 Vaccine (3 - Pfizer risk series) 05/12/2020   COLONOSCOPY (Pts 45-56yrs Insurance coverage will need to be confirmed)  05/25/2020   INFLUENZA VACCINE  05/31/2021    Colorectal cancer screening: Type of screening: Colonoscopy. Completed 05/26/15. Repeat every 10 years  Mammogram status:  Bone Density status: Completed 12/29/20. Results reflect: Bone density results: OSTEOPOROSIS. Repeat every 3 years.  Lung Cancer Screening: (Low Dose CT Chest recommended if Age 43-80 years, 30 pack-year currently smoking OR have quit w/in 15years.) does not qualify.     Additional Screening:  Hepatitis C Screening: does qualify; Completed 07/26/17  Vision Screening: Recommended annual ophthalmology exams for early detection of glaucoma and other disorders of the eye. Is the patient up to date with their annual eye exam?  No  Who is the provider or what is the name of the office in which the patient attends annual eye exams? Patient unsure of providers name   Dental Screening: Recommended annual dental exams for proper oral hygiene  Community Resource Referral / Chronic Care Management: CRR required this visit?  No   CCM required this visit?  No      Plan:     I have personally reviewed and noted the following in the patient's chart:   Medical and social history Use of alcohol, tobacco or illicit drugs  Current medications and supplements including opioid  prescriptions.  Functional ability and status Nutritional status Physical activity Advanced directives List of other physicians Hospitalizations, surgeries, and ER visits in previous 12 months Vitals Screenings to include cognitive, depression, and falls Referrals and appointments  In addition, I have reviewed and discussed with patient certain preventive protocols, quality metrics, and best practice recommendations. A written personalized care plan for preventive services as well as general preventive health recommendations were provided to patient.   Due to this being a telephonic visit, the after visit summary with patients personalized plan was offered to patient via mail or my-chart. Patient preferred to pick up at office at next visit.   Loma Messing, LPN  29/19/1660   Nurse Health Advisor  Nurse Notes: none

## 2021-09-13 ENCOUNTER — Ambulatory Visit
Admission: RE | Admit: 2021-09-13 | Discharge: 2021-09-13 | Disposition: A | Discharge status: Home / Self Care | Payer: Medicare Other | Source: Ambulatory Visit | Attending: Oncology | Admitting: Oncology

## 2021-09-13 ENCOUNTER — Other Ambulatory Visit: Payer: Self-pay

## 2021-09-13 ENCOUNTER — Encounter: Payer: Self-pay | Admitting: Oncology

## 2021-09-13 DIAGNOSIS — Z1231 Encounter for screening mammogram for malignant neoplasm of breast: Secondary | ICD-10-CM | POA: Diagnosis not present

## 2021-09-14 ENCOUNTER — Ambulatory Visit (INDEPENDENT_AMBULATORY_CARE_PROVIDER_SITE_OTHER): Payer: Medicare Other

## 2021-09-14 VITALS — Ht 62.0 in | Wt 114.0 lb

## 2021-09-14 DIAGNOSIS — Z0000 Encounter for general adult medical examination without abnormal findings: Secondary | ICD-10-CM

## 2021-09-14 NOTE — Patient Instructions (Signed)
Shannon Stephens , Thank you for taking time to complete your Medicare Wellness Visit. I appreciate your ongoing commitment to your health goals. Please review the following plan we discussed and let me know if I can assist you in the future.   Screening recommendations/referrals: Colonoscopy: up to date, completed 05/26/15, due 05/25/25 Mammogram: up to date, completed 09/13/21, due 09/13/22 Bone Density: up to date, completed 12/29/20, due 12/30/22 Recommended yearly ophthalmology/optometry visit for glaucoma screening and checkup Recommended yearly dental visit for hygiene and checkup  Vaccinations: Influenza vaccine: Due-May obtain vaccine at our office or your local pharmacy. Pneumococcal vaccine: up  to date  Tdap vaccine: up to date, completed 10/09/12, due 10/09/22 Shingles vaccine: Discuss with your local pharmacy if you change your mind Covid-19:Discuss with your local pharmacy if you change your mind  Advanced directives: information available in office at your next visit  Conditions/risks identified: see problem  Next appointment: Follow up in one year for your annual wellness visit    Preventive Care 65 Years and Older, Female Preventive care refers to lifestyle choices and visits with your health care provider that can promote health and wellness. What does preventive care include? A yearly physical exam. This is also called an annual well check. Dental exams once or twice a year. Routine eye exams. Ask your health care provider how often you should have your eyes checked. Personal lifestyle choices, including: Daily care of your teeth and gums. Regular physical activity. Eating a healthy diet. Avoiding tobacco and drug use. Limiting alcohol use. Practicing safe sex. Taking low-dose aspirin every day. Taking vitamin and mineral supplements as recommended by your health care provider. What happens during an annual well check? The services and screenings done by your health  care provider during your annual well check will depend on your age, overall health, lifestyle risk factors, and family history of disease. Counseling  Your health care provider may ask you questions about your: Alcohol use. Tobacco use. Drug use. Emotional well-being. Home and relationship well-being. Sexual activity. Eating habits. History of falls. Memory and ability to understand (cognition). Work and work Statistician. Reproductive health. Screening  You may have the following tests or measurements: Height, weight, and BMI. Blood pressure. Lipid and cholesterol levels. These may be checked every 5 years, or more frequently if you are over 47 years old. Skin check. Lung cancer screening. You may have this screening every year starting at age 34 if you have a 30-pack-year history of smoking and currently smoke or have quit within the past 15 years. Fecal occult blood test (FOBT) of the stool. You may have this test every year starting at age 19. Flexible sigmoidoscopy or colonoscopy. You may have a sigmoidoscopy every 5 years or a colonoscopy every 10 years starting at age 21. Hepatitis C blood test. Hepatitis B blood test. Sexually transmitted disease (STD) testing. Diabetes screening. This is done by checking your blood sugar (glucose) after you have not eaten for a while (fasting). You may have this done every 1-3 years. Bone density scan. This is done to screen for osteoporosis. You may have this done starting at age 24. Mammogram. This may be done every 1-2 years. Talk to your health care provider about how often you should have regular mammograms. Talk with your health care provider about your test results, treatment options, and if necessary, the need for more tests. Vaccines  Your health care provider may recommend certain vaccines, such as: Influenza vaccine. This is recommended every year. Tetanus,  diphtheria, and acellular pertussis (Tdap, Td) vaccine. You may need a Td  booster every 10 years. Zoster vaccine. You may need this after age 89. Pneumococcal 13-valent conjugate (PCV13) vaccine. One dose is recommended after age 58. Pneumococcal polysaccharide (PPSV23) vaccine. One dose is recommended after age 75. Talk to your health care provider about which screenings and vaccines you need and how often you need them. This information is not intended to replace advice given to you by your health care provider. Make sure you discuss any questions you have with your health care provider. Document Released: 11/13/2015 Document Revised: 07/06/2016 Document Reviewed: 08/18/2015 Elsevier Interactive Patient Education  2017 [place] Prevention in the Home Falls can cause injuries. They can happen to people of all ages. There are many things you can do to make your home safe and to help prevent falls. What can I do on the outside of my home? Regularly fix the edges of walkways and driveways and fix any cracks. Remove anything that might make you trip as you walk through a door, such as a raised step or threshold. Trim any bushes or trees on the path to your home. Use bright outdoor lighting. Clear any walking paths of anything that might make someone trip, such as rocks or tools. Regularly check to see if handrails are loose or broken. Make sure that both sides of any steps have handrails. Any raised decks and porches should have guardrails on the edges. Have any leaves, snow, or ice cleared regularly. Use sand or salt on walking paths during winter. Clean up any spills in your garage right away. This includes oil or grease spills. What can I do in the bathroom? Use night lights. Install grab bars by the toilet and in the tub and shower. Do not use towel bars as grab bars. Use non-skid mats or decals in the tub or shower. If you need to sit down in the shower, use a plastic, non-slip stool. Keep the floor dry. Clean up any water that spills on the floor  as soon as it happens. Remove soap buildup in the tub or shower regularly. Attach bath mats securely with double-sided non-slip rug tape. Do not have throw rugs and other things on the floor that can make you trip. What can I do in the bedroom? Use night lights. Make sure that you have a light by your bed that is easy to reach. Do not use any sheets or blankets that are too big for your bed. They should not hang down onto the floor. Have a firm chair that has side arms. You can use this for support while you get dressed. Do not have throw rugs and other things on the floor that can make you trip. What can I do in the kitchen? Clean up any spills right away. Avoid walking on wet floors. Keep items that you use a lot in easy-to-reach places. If you need to reach something above you, use a strong step stool that has a grab bar. Keep electrical cords out of the way. Do not use floor polish or wax that makes floors slippery. If you must use wax, use non-skid floor wax. Do not have throw rugs and other things on the floor that can make you trip. What can I do with my stairs? Do not leave any items on the stairs. Make sure that there are handrails on both sides of the stairs and use them. Fix handrails that are broken or loose. Make  sure that handrails are as long as the stairways. Check any carpeting to make sure that it is firmly attached to the stairs. Fix any carpet that is loose or worn. Avoid having throw rugs at the top or bottom of the stairs. If you do have throw rugs, attach them to the floor with carpet tape. Make sure that you have a light switch at the top of the stairs and the bottom of the stairs. If you do not have them, ask someone to add them for you. What else can I do to help prevent falls? Wear shoes that: Do not have high heels. Have rubber bottoms. Are comfortable and fit you well. Are closed at the toe. Do not wear sandals. If you use a stepladder: Make sure that it is  fully opened. Do not climb a closed stepladder. Make sure that both sides of the stepladder are locked into place. Ask someone to hold it for you, if possible. Clearly mark and make sure that you can see: Any grab bars or handrails. First and last steps. Where the edge of each step is. Use tools that help you move around (mobility aids) if they are needed. These include: Canes. Walkers. Scooters. Crutches. Turn on the lights when you go into a dark area. Replace any light bulbs as soon as they burn out. Set up your furniture so you have a clear path. Avoid moving your furniture around. If any of your floors are uneven, fix them. If there are any pets around you, be aware of where they are. Review your medicines with your doctor. Some medicines can make you feel dizzy. This can increase your chance of falling. Ask your doctor what other things that you can do to help prevent falls. This information is not intended to replace advice given to you by your health care provider. Make sure you discuss any questions you have with your health care provider. Document Released: 08/13/2009 Document Revised: 03/24/2016 Document Reviewed: 11/21/2014 Elsevier Interactive Patient Education  2017 [employer].

## 2021-11-02 ENCOUNTER — Other Ambulatory Visit: Payer: Self-pay | Admitting: Family Medicine

## 2021-11-02 NOTE — Telephone Encounter (Signed)
Pt hasn't been seen in over a year. Please schedule pt's PART 2 CPE with PCP and then route back to me to get med refilled

## 2021-11-03 NOTE — Telephone Encounter (Signed)
CPE scheduled 01/03/22, last filled on 09/23/20 #90 tabs with 3 refills

## 2021-11-03 NOTE — Telephone Encounter (Signed)
Pt scheduled CPE/LAB in march 2023

## 2021-12-26 ENCOUNTER — Telehealth: Payer: Self-pay | Admitting: Family Medicine

## 2021-12-26 DIAGNOSIS — I10 Essential (primary) hypertension: Secondary | ICD-10-CM

## 2021-12-26 DIAGNOSIS — M810 Age-related osteoporosis without current pathological fracture: Secondary | ICD-10-CM

## 2021-12-26 DIAGNOSIS — R7303 Prediabetes: Secondary | ICD-10-CM

## 2021-12-26 DIAGNOSIS — E7800 Pure hypercholesterolemia, unspecified: Secondary | ICD-10-CM

## 2021-12-26 NOTE — Telephone Encounter (Signed)
-----   Message from Ellamae Sia sent at 12/17/2021  7:20 AM EST ----- Regarding: Lab oreders for Monday, 2.27.23 Patient is scheduled for CPX labs, please order future labs, Thanks , Karna Christmas

## 2021-12-27 ENCOUNTER — Other Ambulatory Visit: Payer: Medicare Other

## 2021-12-30 ENCOUNTER — Other Ambulatory Visit: Payer: Medicare Other

## 2022-01-03 ENCOUNTER — Encounter: Payer: Medicare Other | Admitting: Family Medicine

## 2022-01-03 IMAGING — MG DIGITAL SCREENING UNILAT LEFT W/ TOMO W/ CAD
4 series · 4 of 12 positions shown · non-contrast
Comparison: Previous exam(s).

CLINICAL DATA: Screening.

EXAM:
DIGITAL SCREENING UNILATERAL LEFT MAMMOGRAM WITH CAD AND
TOMOSYNTHESIS
TECHNIQUE: Left screening digital craniocaudal and mediolateral oblique
mammograms were obtained. Left screening digital breast
tomosynthesis was performed. The images were evaluated with
computer-aided detection.

[L MLO synth-2D]
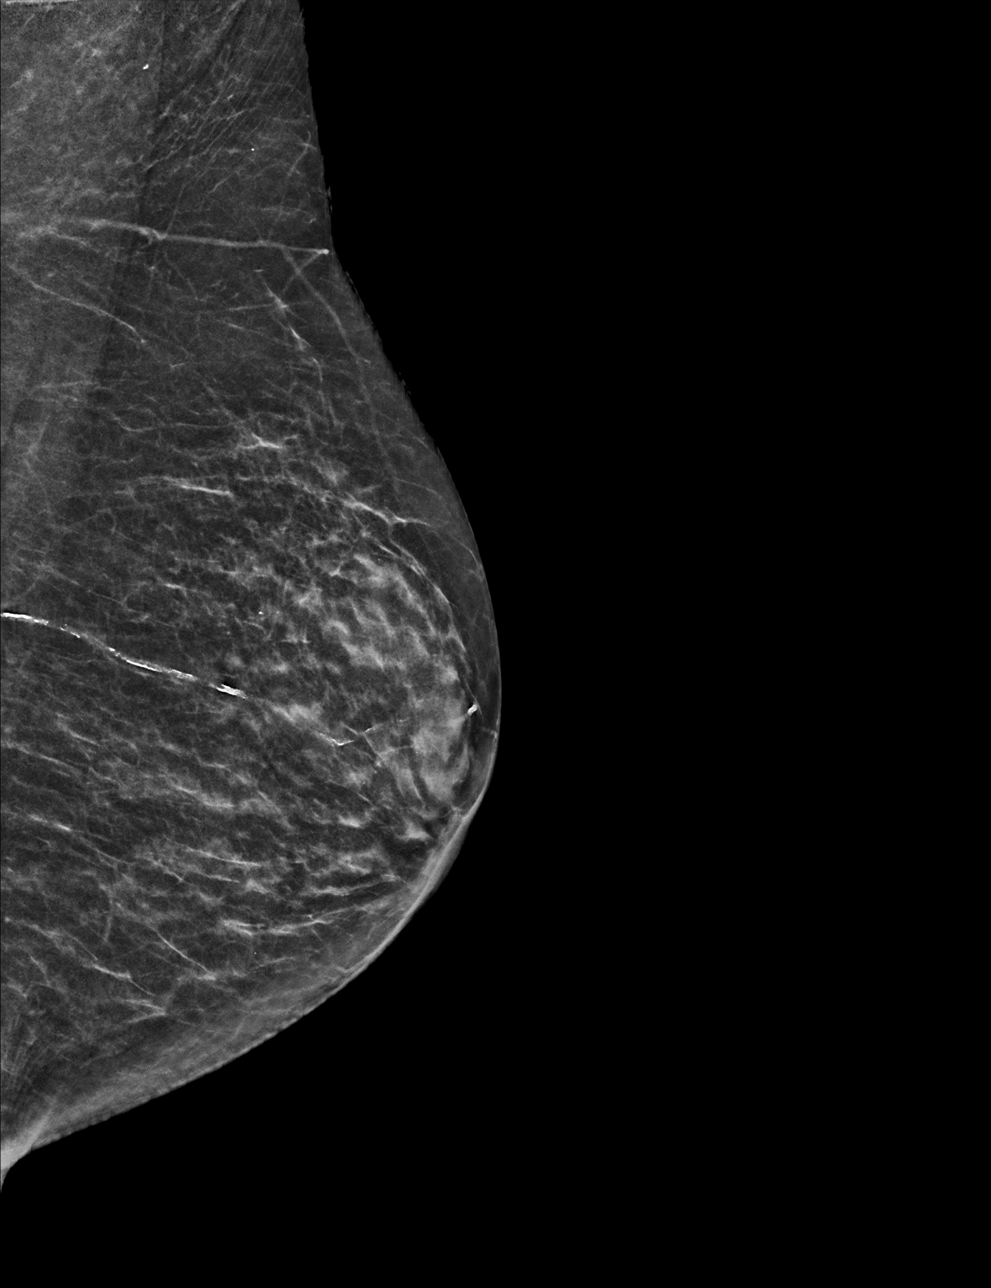

[L CC synth-2D]
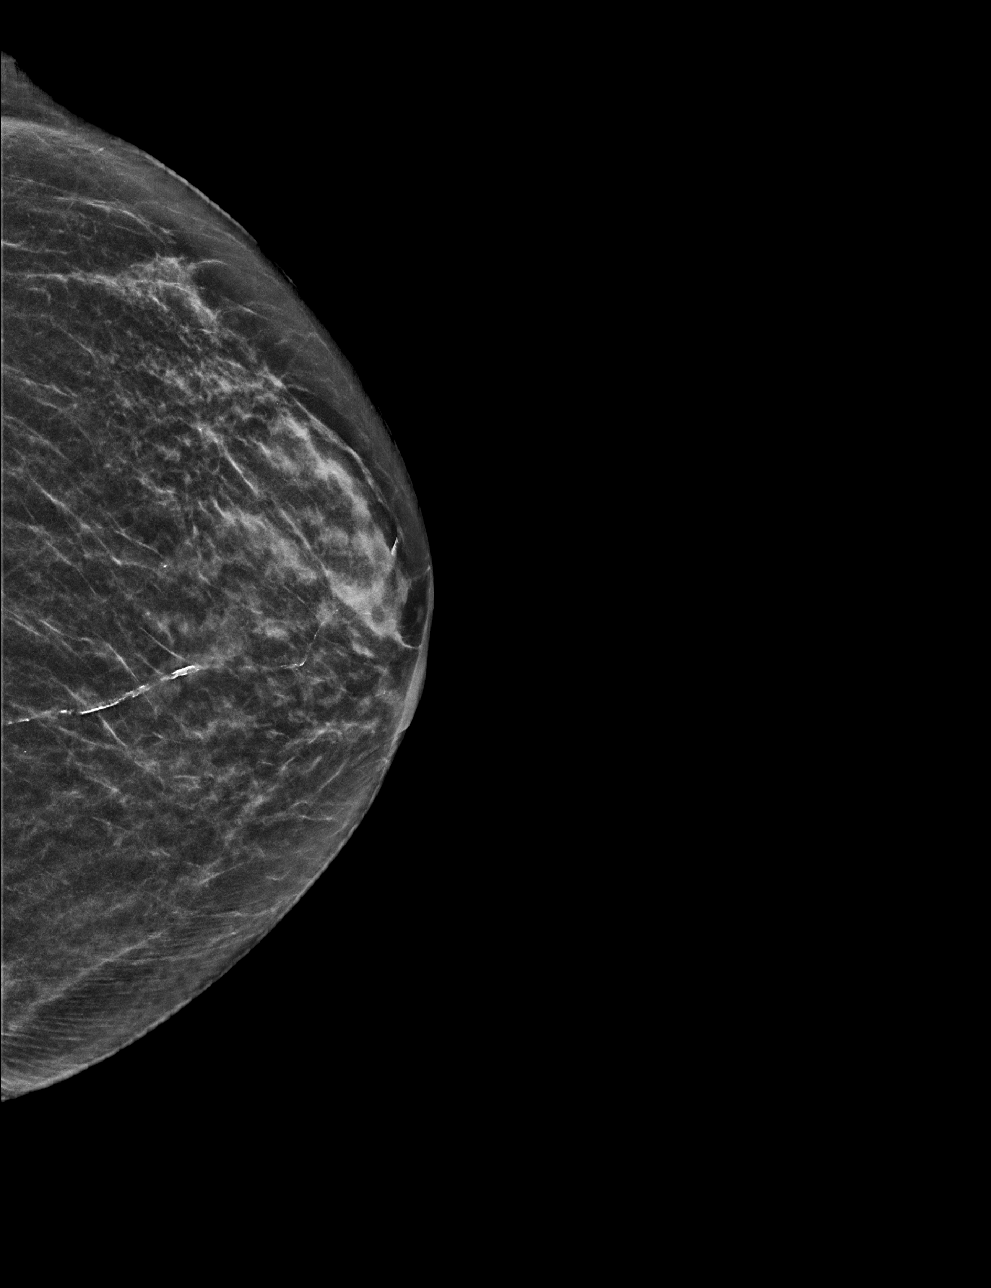

[L CC tomo · tomo slice 23/46.0]
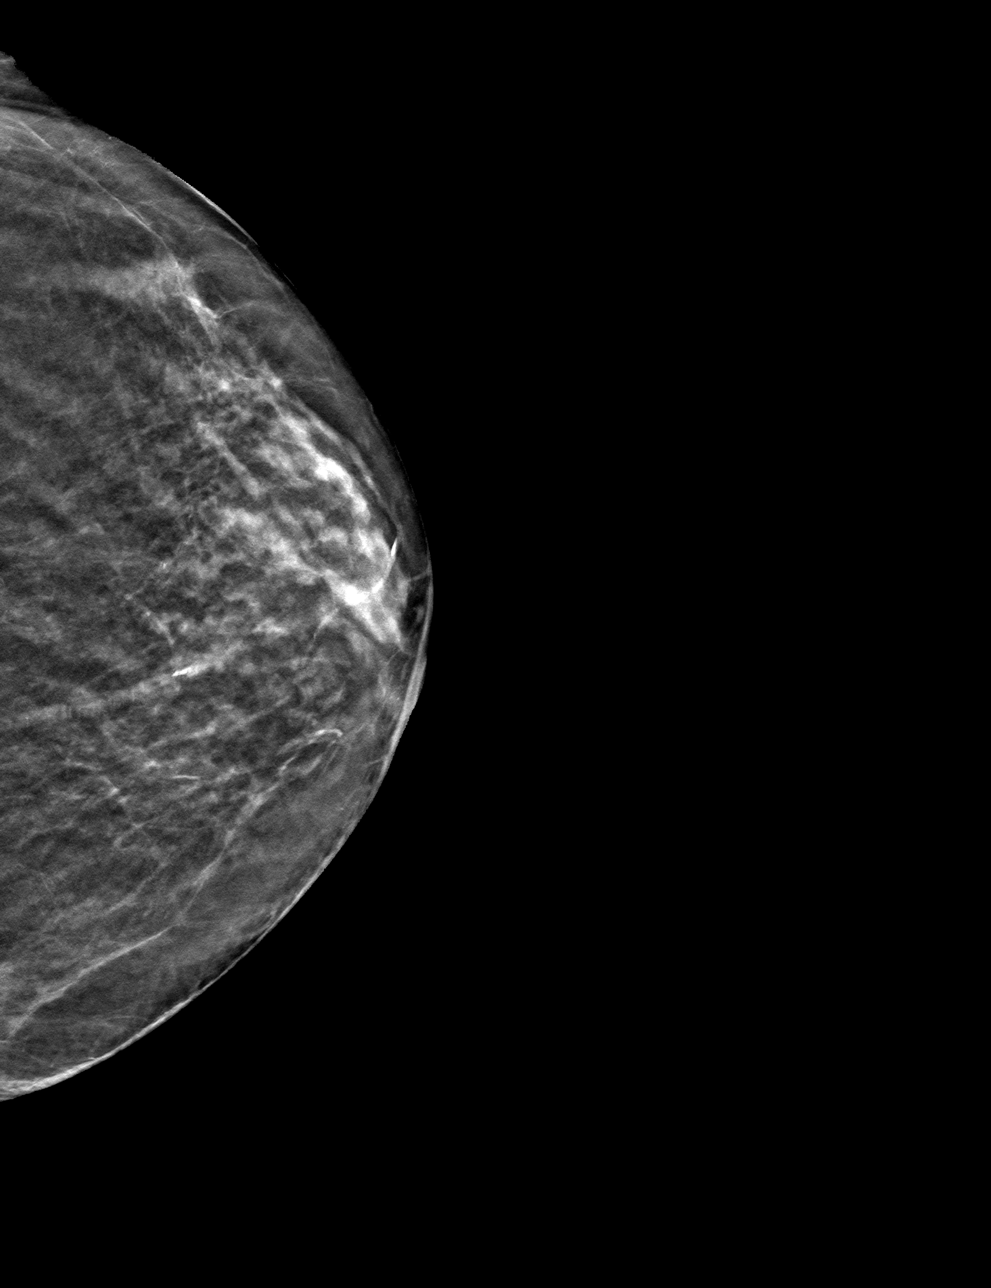

[L MLO tomo · tomo slice 25/48.0]
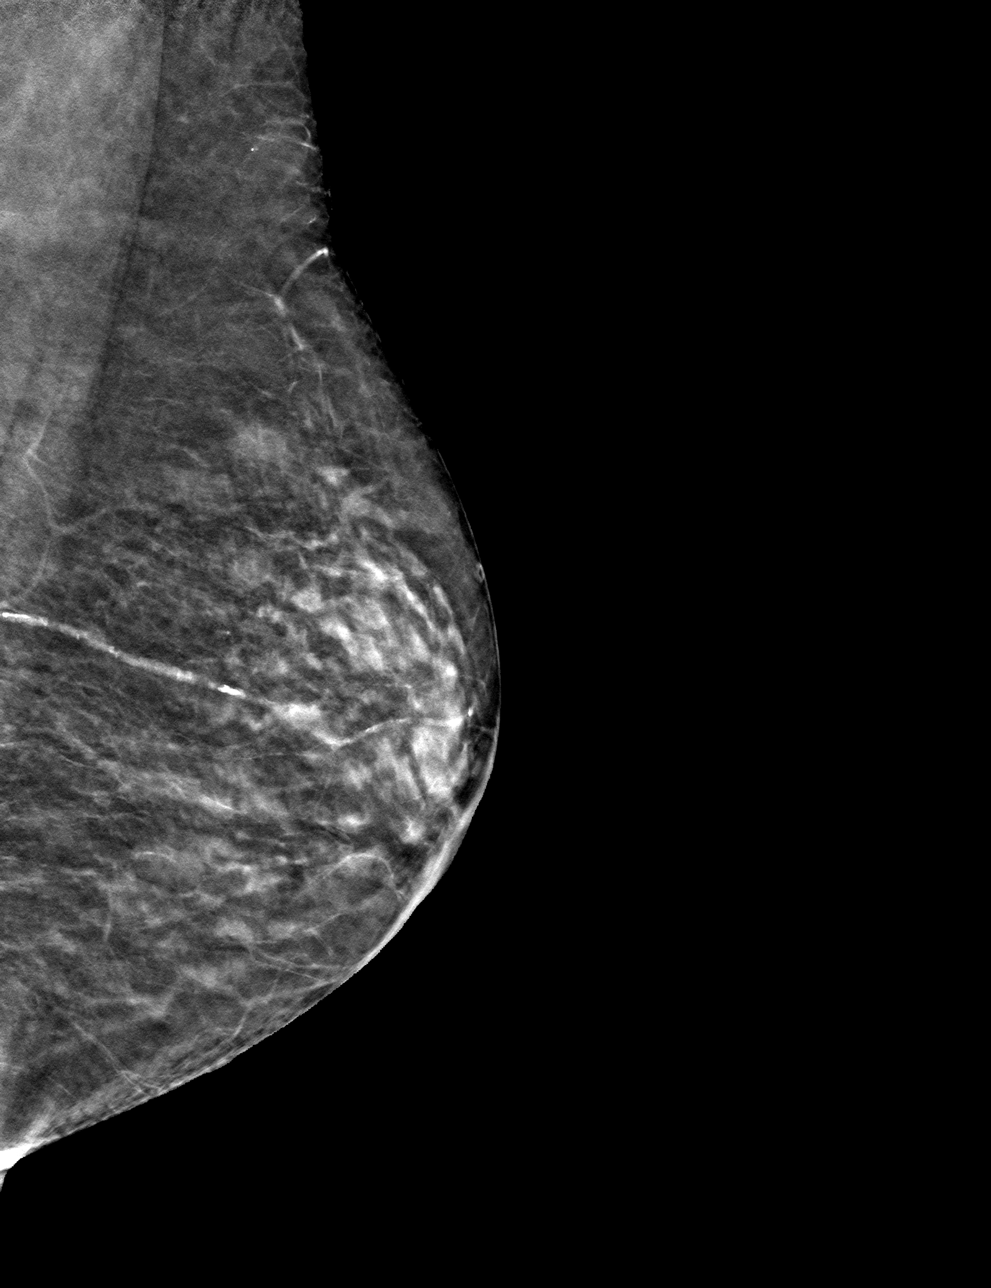

[4 of 12 positions shown; findings below may reference images not displayed]

ACR Breast Density Category b: There are scattered areas of
fibroglandular density.
FINDINGS: The patient has had a right mastectomy. There are no findings
suspicious for malignancy.
IMPRESSION: No mammographic evidence of malignancy. A result letter of this
screening mammogram will be mailed directly to the patient.

RECOMMENDATION:
Screening mammogram in one year.  (Code:[id number])

BI-RADS CATEGORY  1: Negative.

## 2022-01-20 ENCOUNTER — Other Ambulatory Visit: Payer: Self-pay | Admitting: Emergency Medicine

## 2022-01-20 DIAGNOSIS — M810 Age-related osteoporosis without current pathological fracture: Secondary | ICD-10-CM

## 2022-01-21 ENCOUNTER — Inpatient Hospital Stay: Payer: Medicare Other | Attending: Nurse Practitioner | Admitting: Oncology

## 2022-01-21 ENCOUNTER — Inpatient Hospital Stay: Payer: Medicare Other

## 2022-01-21 ENCOUNTER — Encounter: Payer: Self-pay | Admitting: Nurse Practitioner

## 2022-01-21 ENCOUNTER — Inpatient Hospital Stay (HOSPITAL_BASED_OUTPATIENT_CLINIC_OR_DEPARTMENT_OTHER): Payer: Medicare Other | Admitting: Nurse Practitioner

## 2022-01-21 ENCOUNTER — Other Ambulatory Visit: Payer: Self-pay

## 2022-01-21 VITALS — BP 172/88 | HR 87 | Temp 97.6°F | Ht 62.0 in | Wt 107.8 lb

## 2022-01-21 DIAGNOSIS — Z08 Encounter for follow-up examination after completed treatment for malignant neoplasm: Secondary | ICD-10-CM

## 2022-01-21 DIAGNOSIS — M810 Age-related osteoporosis without current pathological fracture: Secondary | ICD-10-CM | POA: Diagnosis not present

## 2022-01-21 DIAGNOSIS — M25551 Pain in right hip: Secondary | ICD-10-CM

## 2022-01-21 DIAGNOSIS — M25552 Pain in left hip: Secondary | ICD-10-CM

## 2022-01-21 DIAGNOSIS — M5416 Radiculopathy, lumbar region: Secondary | ICD-10-CM | POA: Diagnosis not present

## 2022-01-21 DIAGNOSIS — C50311 Malignant neoplasm of lower-inner quadrant of right female breast: Secondary | ICD-10-CM | POA: Diagnosis not present

## 2022-01-21 DIAGNOSIS — Z853 Personal history of malignant neoplasm of breast: Secondary | ICD-10-CM

## 2022-01-21 DIAGNOSIS — Z170 Estrogen receptor positive status [ER+]: Secondary | ICD-10-CM | POA: Insufficient documentation

## 2022-01-21 DIAGNOSIS — R634 Abnormal weight loss: Secondary | ICD-10-CM

## 2022-01-21 LAB — BASIC METABOLIC PANEL
Anion gap: 9 (ref 5–15)
BUN: 14 mg/dL (ref 8–23)
CO2: 29 mmol/L (ref 22–32)
Calcium: 10 mg/dL (ref 8.9–10.3)
Chloride: 102 mmol/L (ref 98–111)
Creatinine, Ser: 0.67 mg/dL (ref 0.44–1.00)
GFR, Estimated: >60 mL/min (ref >60)
Glucose, Bld: 136 mg/dL — ABNORMAL HIGH (ref 70–99)
Potassium: 3.4 mmol/L — ABNORMAL LOW (ref 3.5–5.1)
Sodium: 140 mmol/L (ref 135–145)

## 2022-01-21 MED ORDER — DENOSUMAB 60 MG/ML ~~LOC~~ SOSY
60.0000 mg | PREFILLED_SYRINGE | Freq: Once | SUBCUTANEOUS | Status: AC
  Administered 2022-01-21: 60 mg via SUBCUTANEOUS
  Filled 2022-01-21: qty 1

## 2022-01-21 NOTE — Progress Notes (Signed)
Pt states she feel back before Christmas, having b/l leg, groin and knee pain and weakness. Gets redness in legs at times. ? ?Losing weight. ? ?BMD sch'd for April. ?

## 2022-01-21 NOTE — Progress Notes (Signed)
?[place]  ?[phone]  [phone]  ? ?Shannon Stephens DOB: June 21, 1946  MR#: [id number]  RDE#:[id number] ? ?Patient Care Team: ?Tower, Wynelle Fanny, MD as PCP - General ?Bary Castilla Forest Gleason, MD (General Surgery) ?Tower, Wynelle Fanny, MD as Consulting Physician (Family Medicine) ?Forest Gleason, MD as Consulting Physician (Oncology) ? ?CHIEF COMPLAINT: Pathologic stage Ia adenocarcinoma of the lower inner quadrant of the right breast. ? ?INTERVAL HISTORY: Patient returns to clinic today for routine 49-month evaluation and continuation of Prolia. She completed tamoxifen in the fall. Had a fall in December and was seen at [facility]. Was given steroid taper but continues to have bilateral hip pain that radiates to her knees. Makes it difficult to stay mobile or active. Complains of unintentional weight loss and loss of appetite. Has been trying to drink Boost but doesn't feel like eating. No interval fractures. No neurologic complaints. No fevers or illness. No chest pain, shortness of breath, cough, or hemoptysis. No nausea, vomiting, constipation, or diarrhea. No urinary complaints.  ? ?REVIEW OF SYSTEMS:   ?Review of Systems  ?Constitutional: Negative.  Negative for fever, malaise/fatigue and weight loss.  ?Respiratory: Negative.  Negative for cough and shortness of breath.   ?Cardiovascular: Negative.  Negative for chest pain and leg swelling.  ?Gastrointestinal: Negative.  Negative for abdominal pain.  ?Genitourinary: Negative.  Negative for dysuria.  ?Musculoskeletal: Negative.  Negative for back pain.  ?Skin: Negative.  Negative for rash.  ?Neurological: Negative.  Negative for dizziness, sensory change, focal weakness, weakness and headaches.  ?Psychiatric/Behavioral: Negative.  The patient is not nervous/anxious.   ?Breast: No pain, swelling, tissue changes, lumps or discharge.  ?As per HPI. Otherwise, a complete review of systems is negative. ? ?ONCOLOGY HISTORY: ?Oncology History  Overview Note  ?Carcinoma of breast at 4:00 position at the right breast status post simple mastectomy and anterior lymph node evaluation ? ?Right simple mastectomy done on September 04, 2014 ?T1c N0 M0 tumor. ?estrogen receptor positive progesterone receptor positive HER-2 receptor not overexpressed. ? ?Oncotype DX score 22.  10 year distant recurrence rate 14% with tamoxifen alone. ? ?Patient started on letrozole, 2015. ?  ?Malignant neoplasm of lower-inner quadrant of right breast of female, estrogen receptor positive ([place])  ?08/25/2014 Initial Diagnosis  ? Malignant neoplasm of right breast ([place]) ?  ? ? ?PAST MEDICAL HISTORY: ?Past Medical History:  ?Diagnosis Date  ? Allergic rhinitis   ? Anxiety   ? Breast cancer ([place]) 2015  ? RT MASTECTOMY  ? Breast cancer of lower-inner quadrant of right female breast [facility]) November 2015  ? T1c, N0/ ER + ; PR+; Her 2 neu not overexpressed. Oncotype DX: 14 % recurrence risk w/ antiestrogen alone (low intermediate group).  ? Burning sensation   ? chronic,  skin  ? Chronic cervicitis   ? with squamous metaplasia  ? History of ultrasound, pelvic 03/29/06  ? WNL  ? Hyperlipidemia 2003  ? Hypertension   ? Menopause   ? ? ?PAST SURGICAL HISTORY: ?Past Surgical History:  ?Procedure Laterality Date  ? BREAST BIOPSY Right 2015  ? POS  ? BREAST SURGERY Right 09/04/14  ? Mastectomy   ? COLONOSCOPY  3/02  ? polyps  ? COLONOSCOPY  05-26-15  ? Dr Deatra Ina, 3 mm tubular adenoma in the sigmoid. Repeat exam 2021.  ? [place]  ? for DQB  ? MASTECTOMY Right 08/2014  ? BREAST CA  ? OTHER SURGICAL HISTORY    ?  Neg renal ultrasound  ? ? ?FAMILY HISTORY ?Family History  ?Problem Relation Age of Onset  ? Diabetes Father   ? Hypertension Father   ? Heart failure Father   ? Coronary artery disease Father   ? Hypertension Mother   ? Colon cancer Neg Hx   ? Colon polyps Neg Hx   ? Pancreatic cancer Neg Hx   ? Rectal cancer Neg Hx   ? Stomach cancer Neg Hx   ? Ulcerative colitis  Neg Hx   ? Esophageal cancer Neg Hx   ? Crohn's disease Neg Hx   ? Breast cancer Neg Hx   ? ?GYNECOLOGIC HISTORY:  No LMP recorded. Patient is postmenopausal.  ?  ?ADVANCED DIRECTIVES:  ? ?HEALTH MAINTENANCE: ?Social History  ? ?Tobacco Use  ? Smoking status: Never  ? Smokeless tobacco: Never  ?Vaping Use  ? Vaping Use: Never used  ?Substance Use Topics  ? Alcohol use: Yes  ?  Alcohol/week: 1.0 standard drink  ?  Types: 1 Glasses of wine per week  ?  Comment: at night  ? Drug use: No  ? ? Colonoscopy: ? PAP: ? Bone density: ? Mammogram: ? ?Allergies  ?Allergen Reactions  ? Alendronate Sodium   ?  FOSAMAX REACTION: body pain- severe all over  ? Simvastatin   ?  Muscle pain   ? ?Current Outpatient Medications  ?Medication Sig Dispense Refill  ? B Complex Vitamins (VITAMIN B COMPLEX PO) Take by mouth daily.    ? Calcium Carbonate-Vitamin D 600-400 MG-UNIT per tablet Take 2 tablets by mouth daily.    ? carvedilol (COREG) 25 MG tablet Take 1 tablet (25 mg total) by mouth 2 (two) times daily. 180 tablet 3  ? clonazePAM (KLONOPIN) 1 MG tablet TAKE 1 TABLET BY MOUTH THREE TIMES A DAY AS NEEDED FOR ANXIETY 90 tablet 3  ? mirtazapine (REMERON) 15 MG tablet TAKE 1 TABLET BY MOUTH EVERYDAY AT BEDTIME 90 tablet 1  ? Multiple Vitamin (MULTIVITAMIN) tablet Take 1 tablet by mouth daily.    ? Potassium 95 MG TABS Take by mouth daily.    ? ?No current facility-administered medications for this visit.  ? ?Facility-Administered Medications Ordered in Other Visits  ?Medication Dose Route Frequency Provider Last Rate Last Admin  ? denosumab (PROLIA) injection 60 mg  60 mg Subcutaneous Once Lloyd Huger, MD      ? ?OBJECTIVE: ?BP (!) 172/88 (BP Location: Left Arm, Patient Position: Sitting, Cuff Size: Normal) Comment: Hasn't taken bp rx today  Pulse 87   Temp 97.6 ?F (36.4 ?C) (Tympanic)   Ht $R'5\' 2"'hk$  (1.575 m)   Wt 107 lb 12.8 oz (48.9 kg)   SpO2 99%   BMI 19.72 kg/m?    Body mass index is 19.72 kg/m?Marland Kitchen     ?ECOG FS:0 -  Asymptomatic ? ?General: Well-developed, well-nourished, no acute distress. ?Eyes: Pink conjunctiva, anicteric sclera. ?Lungs: Clear to auscultation bilaterally.  No audible wheezing or coughing ?Heart: Regular rate and rhythm.  ?Abdomen: Soft, nontender, nondistended.  ?Musculoskeletal: No edema, cyanosis, or clubbing. ?Neuro: Alert, answering all questions appropriately. Cranial nerves grossly intact. ?Skin: No rashes or petechiae noted. ?Psych: Normal affect. ?Breast: declined d/t mobility ? ? ?LAB RESULTS: ? ?Appointment on 01/21/2022  ?Component Date Value Ref Range Status  ? Sodium 01/21/2022 140  135 - 145 mmol/L Final  ? Potassium 01/21/2022 3.4 (L)  3.5 - 5.1 mmol/L Final  ? Chloride 01/21/2022 102  98 - 111 mmol/L Final  ? CO2  01/21/2022 29  22 - 32 mmol/L Final  ? Glucose, Bld 01/21/2022 136 (H)  70 - 99 mg/dL Final  ? Glucose reference range applies only to samples taken after fasting for at least 8 hours.  ? BUN 01/21/2022 14  8 - 23 mg/dL Final  ? Creatinine, Ser 01/21/2022 0.67  0.44 - 1.00 mg/dL Final  ? Calcium 01/21/2022 10.0  8.9 - 10.3 mg/dL Final  ? GFR, Estimated 01/21/2022 >60  >60 mL/min Final  ? Comment: (NOTE) ?Calculated using the CKD-EPI Creatinine Equation (2021) ?  ? Anion gap 01/21/2022 9  5 - 15 Final  ? Performed at [facility], [address], [place], [place] [address]  ? ? ?STUDIES: ?No results found. ? ?ASSESSMENT: Pathologic stage Ia adenocarcinoma of the lower inner quadrant of the right breast. ? ?PLAN:   ? ?1. Pathologic stage Ia adenocarcinoma of the lower inner quadrant of the right breast: s/p right mastectomy November 2015. Intermediate risk oncotype score of 22. Did not receive chemotherapy.  Letrozole was discontinued secondary to osteoporosis and she was initiated on tamoxifen.  She has now completed an additional 2 years of endocrine therapy and completed treatment at the end of 2022.  Her most recent mammogram was reported as BI-RADS Category 1 negative, in  November 2022.  Plan to repeat in November 2022. ? ?2.  Osteoporosis-patient's bone mineral density on 2/27/202020 reported T score of -2.9.  2 years prior, her T score was -1.2.  She was unable to tolerate Fosamax and wa

## 2022-01-24 ENCOUNTER — Encounter: Payer: Self-pay | Admitting: Oncology

## 2022-01-24 NOTE — Progress Notes (Signed)
error 

## 2022-01-31 ENCOUNTER — Telehealth: Payer: Self-pay

## 2022-01-31 NOTE — Telephone Encounter (Signed)
I called and spoke with [facility], checking status of referral. They have tried to contact the pt twice to get her sch'd, left 2 voice mails, no return call  back for appt. ?

## 2022-02-02 ENCOUNTER — Ambulatory Visit: Admission: RE | Admit: 2022-02-02 | Payer: Medicare Other | Source: Ambulatory Visit

## 2022-02-17 ENCOUNTER — Ambulatory Visit
Admission: RE | Admit: 2022-02-17 | Discharge: 2022-02-17 | Disposition: A | Discharge status: Home / Self Care | Payer: Medicare Other | Source: Ambulatory Visit | Attending: Oncology | Admitting: Oncology

## 2022-02-17 DIAGNOSIS — Z170 Estrogen receptor positive status [ER+]: Secondary | ICD-10-CM | POA: Insufficient documentation

## 2022-02-17 DIAGNOSIS — C50311 Malignant neoplasm of lower-inner quadrant of right female breast: Secondary | ICD-10-CM | POA: Diagnosis not present

## 2022-02-17 DIAGNOSIS — M810 Age-related osteoporosis without current pathological fracture: Secondary | ICD-10-CM

## 2022-02-17 DIAGNOSIS — M85851 Other specified disorders of bone density and structure, right thigh: Secondary | ICD-10-CM | POA: Diagnosis not present

## 2022-02-17 DIAGNOSIS — Z780 Asymptomatic menopausal state: Secondary | ICD-10-CM | POA: Diagnosis not present

## 2022-02-22 ENCOUNTER — Other Ambulatory Visit: Payer: Self-pay | Admitting: Family Medicine

## 2022-02-22 NOTE — Telephone Encounter (Signed)
Refilled times ne  ?Please schedule f/u appt  ?

## 2022-02-22 NOTE — Telephone Encounter (Signed)
Name of Medication: Klonopin ?Name of Pharmacy: [facility] [address] ?Last Fill or Written Date and Quantity:06/09/21 #90 tabs with 3 refills ?Last Office Visit and Type: 09/23/20 CPE ?Next Office Visit and Type: none scheduled ?

## 2022-02-22 NOTE — Telephone Encounter (Signed)
Letter mailed letting pt know appt is due ?

## 2022-07-18 ENCOUNTER — Telehealth: Payer: Self-pay

## 2022-07-18 NOTE — Telephone Encounter (Signed)
Patient called stating she will be out of town and needs to reschedule 9/21 apt(lab, MD, inj). She says she got a message and was unaware because no one mailed the apts. She is requesting Wed 10/4 or 10/11 same time. She asks to please mail appointments once scheduled.

## 2022-07-18 NOTE — Telephone Encounter (Signed)
Appts have been scheduled and mailed.

## 2022-07-21 ENCOUNTER — Inpatient Hospital Stay: Payer: Medicare Other

## 2022-07-21 ENCOUNTER — Inpatient Hospital Stay: Payer: Medicare Other | Admitting: Oncology

## 2022-08-07 NOTE — Progress Notes (Addendum)
Shannon Stephens DOB: May 01, 1946  MR#: [id number]  HBZ#:[id number]  Patient Care Team: Abner Greenspan, MD as PCP - General Byrnett, Forest Gleason, MD (General Surgery) Tower, Wynelle Fanny, MD as Consulting Physician (Family Medicine) Forest Gleason, MD as Consulting Physician (Oncology) Lloyd Huger, MD as Consulting Physician (Oncology)  CHIEF COMPLAINT: Pathologic stage Ia adenocarcinoma of the lower inner quadrant of the right breast.  INTERVAL HISTORY: Patient returns to clinic today for routine 47-monthevaluation and continuation of Prolia.  She currently feels well and is asymptomatic.  She does not complain of any weakness or fatigue today.  She has no neurologic complaints. She denies any recent fevers or illnesses.  She denies any chest pain, shortness of breath, cough, or hemoptysis.  She denies any nausea, vomiting, constipation, or diarrhea. She has no urinary complaints.  Patient offers no further specific complaints today.  REVIEW OF SYSTEMS:   Review of Systems  Constitutional: Negative.  Negative for fever, malaise/fatigue and weight loss.  Respiratory: Negative.  Negative for cough and shortness of breath.   Cardiovascular: Negative.  Negative for chest pain and leg swelling.  Gastrointestinal: Negative.  Negative for abdominal pain.  Genitourinary: Negative.  Negative for dysuria.  Musculoskeletal: Negative.  Negative for back pain.  Skin: Negative.  Negative for rash.  Neurological: Negative.  Negative for dizziness, sensory change, focal weakness, weakness and headaches.  Psychiatric/Behavioral: Negative.  The patient is not nervous/anxious.     As per HPI. Otherwise, a complete review of systems is negative.  ONCOLOGY HISTORY: Oncology History Overview Note  Carcinoma of breast at 4:00 position at the right breast status post simple mastectomy and anterior lymph node evaluation  Right simple  mastectomy done on September 04, 2014 T1c N0 M0 tumor. estrogen receptor positive progesterone receptor positive HER-2 receptor not overexpressed.  Oncotype DX score 22.  10 year distant recurrence rate 14% with tamoxifen alone.  Patient started on letrozole, 2015.   Malignant ne[place] of lower-inner quadrant of right breast of [place], [place]  [place] Initial Diagnosis   Malignant neoplasm of right breast ([facility]     PAST MEDICAL HISTORY: Past Medical History:  Diagnosis Date   Allergic rhinitis    Anxiety    [place] [place]   RT MASTECTOMY   Breast cancer of lower-inner quadrant of right female breast ([facility] November 2015   T1c, N0/ ER + ; PR+; Her 2 neu not overexpressed. Oncotype DX: 14 % recurrence risk w/ antiestrogen alone (low intermediate group).   Burning sensation    chronic,  skin   Chronic cervicitis    with squamous metaplasia   History of ultrasound, pelvic 03/29/06   WNL   Hyperlipidemia 2003   Hypertension    Menopause     PAST SURGICAL HISTORY: Past Surgical History:  Procedure Laterality Date   BREAST BIOPSY Right 2015   POS   BREAST SURGERY Right 09/04/14   Mastectomy    COLONOSCOPY  3/02   polyps   COLONOSCOPY  05-26-15   Dr KDeatra Ina 3 mm tubular adenoma in the sigmoid. Repeat exam 2021.   DILATION AND CURETTAGE OF UTERUS  1987   for DQB   MASTECTOMY Right 08/2014   BREAST CA   OTHER SURGICAL HISTORY     Neg renal ultrasound    FAMILY HISTORY Family History  Problem Relation Age of Onset   Diabetes Father  Hypertension Father    Heart failure Father    Coronary artery disease Father    Hypertension Mother    Colon cancer Neg Hx    Colon polyps Neg Hx    Pancreatic cancer Neg Hx    Rectal cancer Neg Hx    Stomach cancer Neg Hx    Ulcerative colitis Neg Hx    Esophageal cancer Neg Hx    Crohn's disease Neg Hx    Breast cancer Neg Hx     GYNECOLOGIC HISTORY:  No LMP recorded. Patient is  postmenopausal.     ADVANCED DIRECTIVES:    HEALTH MAINTENANCE: Social History   Tobacco Use   Smoking status: Never   Smokeless tobacco: Never  Vaping Use   Vaping Use: Never used  Substance Use Topics   Alcohol use: Yes    Alcohol/week: 1.0 standard drink of alcohol    Types: 1 Glasses of wine per week    Comment: at night   Drug use: No     Colonoscopy:  PAP:  Bone density:  Mammogram:  Allergies  Allergen Reactions   Alendronate Sodium     FOSAMAX REACTION: body pain- severe all over   Simvastatin     Muscle pain     Current Outpatient Medications  Medication Sig Dispense Refill   B Complex Vitamins (VITAMIN B COMPLEX PO) Take by mouth daily.     Calcium Carbonate-Vitamin D 600-400 MG-UNIT per tablet Take 2 tablets by mouth daily.     Multiple Vitamin (MULTIVITAMIN) tablet Take 1 tablet by mouth daily.     Potassium 95 MG TABS Take by mouth daily.     carvedilol (COREG) 25 MG tablet Take 1 tablet (25 mg total) by mouth 2 (two) times daily. 60 tablet 0   clonazePAM (KLONOPIN) 1 MG tablet TAKE 1 TABLET BY MOUTH THREE TIMES A DAY AS NEEDED FOR ANXIETY 90 tablet 0   mirtazapine (REMERON) 15 MG tablet Take 1 tablet (15 mg total) by mouth at bedtime. 30 tablet 0   No current facility-administered medications for this visit.    OBJECTIVE: BP (!) 144/74 (BP Location: Left Arm, Patient Position: Sitting)   Pulse 79   Temp 97.6 F (36.4 C) (Tympanic)   Ht 5' 2" (1.575 m)   Wt 101 lb 6.4 oz (46 kg)   BMI 18.55 kg/m    Body mass index is 18.55 kg/m.    ECOG FS:0 - Asymptomatic  General: Well-developed, well-nourished, no acute distress.  Eyes: Pink conjunctiva, anicteric sclera. HEENT: Normocephalic, moist mucous membranes. Lungs: No audible wheezing or coughing. Heart: Regular rate and rhythm. Abdomen: Soft, nontender, no obvious distention. Musculoskeletal: No edema, cyanosis, or clubbing. Neuro: Alert, answering all questions appropriately. Cranial nerves  grossly intact. Skin: No rashes or petechiae noted. Psych: Normal affect.    LAB RESULTS:  Appointment on 08/10/2022  Component Date Value Ref Range Status   Sodium 08/10/2022 136  135 - 145 mmol/L Final   Potassium 08/10/2022 4.0  3.5 - 5.1 mmol/L Final   Chloride 08/10/2022 100  98 - 111 mmol/L Final   CO2 08/10/2022 30  22 - 32 mmol/L Final   Glucose, Bld 08/10/2022 102 (H)  70 - 99 mg/dL Final   Glucose reference range applies only to samples taken after fasting for at least 8 hours.   BUN 08/10/2022 25 (H)  8 - 23 mg/dL Final   Creatinine, Ser 08/10/2022 0.62  0.44 - 1.00 mg/dL Final   Calcium 08/10/2022 9.9  8.9 - 10.3 mg/dL Final   GFR, Estimated 08/10/2022 >60  >60 mL/min Final   Comment: (NOTE) Calculated using the CKD-EPI Creatinine Equation (2021)    Anion gap 08/10/2022 6  5 - 15 Final   Performed at [facility], [address] [place], [place] [address]    STUDIES: No results found.  ASSESSMENT: Pathologic stage Ia adenocarcinoma of the lower inner quadrant of the right breast.  PLAN:    1. Pathologic stage Ia adenocarcinoma of the lower inner quadrant of the right breast: Patient is status post right mastectomy in November 2015. Her Oncotype score of 22 was intermediate risk, but patient did not receive chemotherapy.  Letrozole was discontinued secondary to osteoporosis and patient was initiated on tamoxifen.  Patient completed 7 years of treatment in approximately November 2022.  She had a left breast screening mammogram on September 13, 2021 that was reported as BI-RADS 1.  Repeat left screening mammogram in November 2023.  Return to clinic in 6 months for routine evaluation.   2.  Osteoporosis: Patient's most recent bone mineral density on February 17, 2022 reported T score of -2.6.  Proceed with Prolia today.  Continue calcium and vitamin D supplementation.  Repeat bone mineral density in April 2024.  Follow-up in 6 months for routine evaluation and  continuation of treatment.  I spent a total of 30 minutes reviewing chart data, face-to-face evaluation with the patient, counseling and coordination of care as detailed above.   Patient expressed understanding and was in agreement with this plan. She also understands that She can call clinic at any time with any questions, concerns, or complaints.     Lloyd Huger, MD   08/11/2022 6:20 AM

## 2022-08-10 ENCOUNTER — Other Ambulatory Visit: Payer: Self-pay

## 2022-08-10 ENCOUNTER — Inpatient Hospital Stay: Payer: Medicare Other

## 2022-08-10 ENCOUNTER — Inpatient Hospital Stay: Payer: Medicare Other | Attending: Oncology | Admitting: Oncology

## 2022-08-10 ENCOUNTER — Encounter: Payer: Self-pay | Admitting: Oncology

## 2022-08-10 VITALS — BP 144/74 | HR 79 | Temp 97.6°F | Ht 62.0 in | Wt 101.4 lb

## 2022-08-10 DIAGNOSIS — C50311 Malignant neoplasm of lower-inner quadrant of right female breast: Secondary | ICD-10-CM | POA: Diagnosis not present

## 2022-08-10 DIAGNOSIS — M810 Age-related osteoporosis without current pathological fracture: Secondary | ICD-10-CM | POA: Insufficient documentation

## 2022-08-10 DIAGNOSIS — Z170 Estrogen receptor positive status [ER+]: Secondary | ICD-10-CM

## 2022-08-10 DIAGNOSIS — Z853 Personal history of malignant neoplasm of breast: Secondary | ICD-10-CM | POA: Insufficient documentation

## 2022-08-10 DIAGNOSIS — Z08 Encounter for follow-up examination after completed treatment for malignant neoplasm: Secondary | ICD-10-CM | POA: Diagnosis not present

## 2022-08-10 LAB — BASIC METABOLIC PANEL
Anion gap: 6 (ref 5–15)
BUN: 25 mg/dL — ABNORMAL HIGH (ref 8–23)
CO2: 30 mmol/L (ref 22–32)
Calcium: 9.9 mg/dL (ref 8.9–10.3)
Chloride: 100 mmol/L (ref 98–111)
Creatinine, Ser: 0.62 mg/dL (ref 0.44–1.00)
GFR, Estimated: >60 mL/min (ref >60)
Glucose, Bld: 102 mg/dL — ABNORMAL HIGH (ref 70–99)
Potassium: 4 mmol/L (ref 3.5–5.1)
Sodium: 136 mmol/L (ref 135–145)

## 2022-08-10 MED ORDER — MIRTAZAPINE 15 MG PO TABS: 15.0000 mg | ORAL_TABLET | Freq: Every day | ORAL | 0 refills | Status: AC

## 2022-08-10 MED ORDER — CARVEDILOL 25 MG PO TABS: 25.0000 mg | ORAL_TABLET | Freq: Two times a day (BID) | ORAL | 0 refills | Status: DC

## 2022-08-10 MED ORDER — CLONAZEPAM 1 MG PO TABS: ORAL_TABLET | ORAL | 0 refills | Status: DC

## 2022-08-10 MED ORDER — DENOSUMAB 60 MG/ML ~~LOC~~ SOSY
60.0000 mg | PREFILLED_SYRINGE | Freq: Once | SUBCUTANEOUS | Status: AC
  Administered 2022-08-10: 60 mg via SUBCUTANEOUS
  Filled 2022-08-10: qty 1

## 2022-08-11 ENCOUNTER — Encounter: Payer: Self-pay | Admitting: Oncology

## 2022-08-17 ENCOUNTER — Encounter: Payer: Self-pay | Admitting: Oncology

## 2022-09-02 ENCOUNTER — Other Ambulatory Visit: Payer: Self-pay | Admitting: Oncology

## 2022-09-02 ENCOUNTER — Other Ambulatory Visit: Payer: Self-pay | Admitting: Family Medicine

## 2022-09-02 NOTE — Telephone Encounter (Signed)
Called placed to patient and pharmacy that Dr Grayland Ormond refilled carvedilol  x 1 as favor to the patient. All future refills need to go through her PCP.

## 2022-09-06 ENCOUNTER — Telehealth: Payer: Self-pay | Admitting: Family Medicine

## 2022-09-06 NOTE — Telephone Encounter (Signed)
LVM for pt to rtn my call to schedule AWV with [facility] call back # [phone] 

## 2022-09-13 ENCOUNTER — Telehealth: Payer: Self-pay | Admitting: Family Medicine

## 2022-09-13 NOTE — Telephone Encounter (Signed)
LVM for pt to rtn my call to schedule AWV with [facility] call back # [phone] 

## 2022-10-13 ENCOUNTER — Telehealth: Payer: Self-pay | Admitting: Family Medicine

## 2022-10-13 NOTE — Telephone Encounter (Signed)
LVM for pt to rtn my call to schedule AWV with [facility] call back # [phone] 

## 2023-02-09 ENCOUNTER — Inpatient Hospital Stay: Payer: Medicare Other

## 2023-02-09 ENCOUNTER — Inpatient Hospital Stay: Payer: Medicare Other | Admitting: Oncology

## 2023-02-22 ENCOUNTER — Other Ambulatory Visit: Payer: Self-pay | Admitting: *Deleted

## 2023-02-22 DIAGNOSIS — M810 Age-related osteoporosis without current pathological fracture: Secondary | ICD-10-CM

## 2023-02-22 DIAGNOSIS — Z170 Estrogen receptor positive status [ER+]: Secondary | ICD-10-CM

## 2023-02-23 ENCOUNTER — Other Ambulatory Visit: Payer: Self-pay

## 2023-02-23 DIAGNOSIS — Z170 Estrogen receptor positive status [ER+]: Secondary | ICD-10-CM

## 2023-02-24 ENCOUNTER — Inpatient Hospital Stay (HOSPITAL_BASED_OUTPATIENT_CLINIC_OR_DEPARTMENT_OTHER): Payer: Medicare Other | Admitting: Oncology

## 2023-02-24 ENCOUNTER — Inpatient Hospital Stay: Payer: Medicare Other

## 2023-02-24 ENCOUNTER — Inpatient Hospital Stay: Payer: Medicare Other | Attending: Oncology

## 2023-02-24 ENCOUNTER — Encounter: Payer: Self-pay | Admitting: Oncology

## 2023-02-24 VITALS — BP 171/101 | HR 91 | Temp 96.4°F | Resp 16 | Ht 62.0 in | Wt 94.0 lb

## 2023-02-24 DIAGNOSIS — M810 Age-related osteoporosis without current pathological fracture: Secondary | ICD-10-CM | POA: Diagnosis not present

## 2023-02-24 DIAGNOSIS — Z853 Personal history of malignant neoplasm of breast: Secondary | ICD-10-CM | POA: Insufficient documentation

## 2023-02-24 DIAGNOSIS — F419 Anxiety disorder, unspecified: Secondary | ICD-10-CM | POA: Insufficient documentation

## 2023-02-24 DIAGNOSIS — Z08 Encounter for follow-up examination after completed treatment for malignant neoplasm: Secondary | ICD-10-CM | POA: Insufficient documentation

## 2023-02-24 DIAGNOSIS — I10 Essential (primary) hypertension: Secondary | ICD-10-CM | POA: Insufficient documentation

## 2023-02-24 DIAGNOSIS — Z170 Estrogen receptor positive status [ER+]: Secondary | ICD-10-CM

## 2023-02-24 LAB — CBC WITH DIFFERENTIAL (CANCER CENTER ONLY)
Abs Immature Granulocytes: 0.02 10*3/uL (ref 0.00–0.07)
Basophils Absolute: 0 10*3/uL (ref 0.0–0.1)
Basophils Relative: 1 %
Eosinophils Absolute: 0.1 10*3/uL (ref 0.0–0.5)
Eosinophils Relative: 2 %
HCT: 40.6 % (ref 36.0–46.0)
Hemoglobin: 13.5 g/dL (ref 12.0–15.0)
Immature Granulocytes: 0 %
Lymphocytes Relative: 30 %
Lymphs Abs: 1.6 10*3/uL (ref 0.7–4.0)
MCH: 34.2 pg — ABNORMAL HIGH (ref 26.0–34.0)
MCHC: 33.3 g/dL (ref 30.0–36.0)
MCV: 102.8 fL — ABNORMAL HIGH (ref 80.0–100.0)
Monocytes Absolute: 0.6 10*3/uL (ref 0.1–1.0)
Monocytes Relative: 11 %
Neutro Abs: 3.1 10*3/uL (ref 1.7–7.7)
Neutrophils Relative %: 56 %
Platelet Count: 280 10*3/uL (ref 150–400)
RBC: 3.95 MIL/uL (ref 3.87–5.11)
RDW: 13.2 % (ref 11.5–15.5)
WBC Count: 5.5 10*3/uL (ref 4.0–10.5)
nRBC: 0 % (ref 0.0–0.2)

## 2023-02-24 LAB — BASIC METABOLIC PANEL - CANCER CENTER ONLY
Anion gap: 10 (ref 5–15)
BUN: 14 mg/dL (ref 8–23)
CO2: 28 mmol/L (ref 22–32)
Calcium: 9.6 mg/dL (ref 8.9–10.3)
Chloride: 94 mmol/L — ABNORMAL LOW (ref 98–111)
Creatinine: 0.58 mg/dL (ref 0.44–1.00)
GFR, Estimated: >60 mL/min (ref >60)
Glucose, Bld: 116 mg/dL — ABNORMAL HIGH (ref 70–99)
Potassium: 3.8 mmol/L (ref 3.5–5.1)
Sodium: 132 mmol/L — ABNORMAL LOW (ref 135–145)

## 2023-02-24 MED ORDER — CLONAZEPAM 1 MG PO TABS: ORAL_TABLET | ORAL | 0 refills | Status: AC

## 2023-02-24 MED ORDER — CARVEDILOL 25 MG PO TABS: 25.0000 mg | ORAL_TABLET | Freq: Two times a day (BID) | ORAL | 0 refills | Status: DC

## 2023-02-24 MED ORDER — DENOSUMAB 60 MG/ML ~~LOC~~ SOSY
60.0000 mg | PREFILLED_SYRINGE | Freq: Once | SUBCUTANEOUS | Status: AC
  Administered 2023-02-24: 60 mg via SUBCUTANEOUS
  Filled 2023-02-24: qty 1

## 2023-02-24 NOTE — Progress Notes (Signed)
[facility]  [phone]  [phone]     Shannon Stephens DOB: January 19, 1946  MR#: [id number]  AOZ#:[id number]  Patient Care Team: Judy Pimple, MD as PCP - General Byrnett, Merrily Pew, MD (General Surgery) Tower, Audrie Gallus, MD as Consulting Physician (Family Medicine) Johney Maine, MD as Consulting Physician (Oncology) Jeralyn Ruths, MD as Consulting Physician (Oncology)  CHIEF COMPLAINT: Pathologic stage Ia adenocarcinoma of the lower inner quadrant of the right breast.  INTERVAL HISTORY: Patient returns to clinic today for routine 83-month evaluation and continuation of Prolia.  She currently feels well and is asymptomatic.  She has chronic weakness and fatigue.  She complains of poor taste, decreased appetite, and weight loss.  She has no neurologic complaints. She denies any recent fevers or illnesses.  She denies any chest pain, shortness of breath, cough, or hemoptysis.  She denies any nausea, vomiting, constipation, or diarrhea. She has no urinary complaints.  Patient offers no further specific complaints today.  REVIEW OF SYSTEMS:   Review of Systems  Constitutional:  Positive for malaise/fatigue and weight loss. Negative for fever.  Respiratory: Negative.  Negative for cough and shortness of breath.   Cardiovascular: Negative.  Negative for chest pain and leg swelling.  Gastrointestinal: Negative.  Negative for abdominal pain.  Genitourinary: Negative.  Negative for dysuria.  Musculoskeletal: Negative.  Negative for back pain.  Skin: Negative.  Negative for rash.  Neurological:  Positive for weakness. Negative for dizziness, sensory change, focal weakness and headaches.  Psychiatric/Behavioral: Negative.  The patient is not nervous/anxious.     As per HPI. Otherwise, a complete review of systems is negative.  ONCOLOGY HISTORY: Oncology History Overview Note  Carcinoma of breast at 4:00 position at the right breast status post simple  mastectomy and anterior lymph node evaluation  Right simple mastectomy done on September 04, 2014 T1c N0 M0 tumor. estrogen receptor positive progesterone receptor positive HER-2 receptor not overexpressed.  Oncotype DX score 22.  10 year distant recurrence rate 14% with tamoxifen alone.  Patient started on letrozole, 2015.   Malignant neoplasm of lower-inner quadrant of right breast of female, estrogen receptor positive ([facility])  08/25/2014 Initial Diagnosis   Malignant neoplasm of right breast [facility])     PAST MEDICAL HISTORY: Past Medical History:  Diagnosis Date   Allergic rhinitis    Anxiety    Breast cancer ([facility]) 2015   RT MASTECTOMY   Breast cancer of lower-inner quadrant of right female breast [facility]) November 2015   T1c, N0/ ER + ; PR+; Her 2 neu not overexpressed. Oncotype DX: 14 % recurrence risk w/ antiestrogen alone (low intermediate group).   Burning sensation    chronic,  skin   Chronic cervicitis    with squamous metaplasia   History of ultrasound, pelvic 03/29/06   WNL   Hyperlipidemia 2003   Hypertension    Menopause     PAST SURGICAL HISTORY: Past Surgical History:  Procedure Laterality Date   BREAST BIOPSY Right 2015   POS   BREAST SURGERY Right 09/04/14   Mastectomy    COLONOSCOPY  3/02   polyps   COLONOSCOPY  05-26-15   Dr Arlyce Dice, 3 mm tubular adenoma in the sigmoid. Repeat exam 2021.   DILATION AND CURETTAGE OF UTERUS  1987   for DQB   MASTECTOMY Right 08/2014   BREAST CA   OTHER SURGICAL HISTORY     Neg renal ultrasound    FAMILY HISTORY Family History  Problem Relation  Age of Onset   Diabetes Father    Hypertension Father    Heart failure Father    Coronary artery disease Father    Hypertension Mother    Colon cancer Neg Hx    Colon polyps Neg Hx    Pancreatic cancer Neg Hx    Rectal cancer Neg Hx    Stomach cancer Neg Hx    Ulcerative colitis Neg Hx    Esophageal cancer Neg Hx    Crohn's disease Neg Hx    Breast cancer Neg Hx      GYNECOLOGIC HISTORY:  No LMP recorded. Patient is postmenopausal.     ADVANCED DIRECTIVES:    HEALTH MAINTENANCE: Social History   Tobacco Use   Smoking status: Never   Smokeless tobacco: Never  Vaping Use   Vaping Use: Never used  Substance Use Topics   Alcohol use: Yes    Alcohol/week: 1.0 standard drink of alcohol    Types: 1 Glasses of wine per week    Comment: at night   Drug use: No     Colonoscopy:  PAP:  Bone density:  Mammogram:  Allergies  Allergen Reactions   Alendronate Sodium     FOSAMAX REACTION: body pain- severe all over   Simvastatin     Muscle pain     Current Outpatient Medications  Medication Sig Dispense Refill   B Complex Vitamins (VITAMIN B COMPLEX PO) Take by mouth daily.     Calcium Carbonate-Vitamin D 600-400 MG-UNIT per tablet Take 2 tablets by mouth daily.     mirtazapine (REMERON) 15 MG tablet Take 1 tablet (15 mg total) by mouth at bedtime. 30 tablet 0   Multiple Vitamin (MULTIVITAMIN) tablet Take 1 tablet by mouth daily.     carvedilol (COREG) 25 MG tablet Take 1 tablet (25 mg total) by mouth 2 (two) times daily. 60 tablet 0   clonazePAM (KLONOPIN) 1 MG tablet TAKE 1 TABLET BY MOUTH THREE TIMES A DAY AS NEEDED FOR ANXIETY 60 tablet 0   Potassium 95 MG TABS Take by mouth daily. (Patient not taking: Reported on 02/24/2023)     No current facility-administered medications for this visit.    OBJECTIVE: BP (!) 171/101 (BP Location: Left Arm, Patient Position: Sitting, Cuff Size: Normal)   Pulse 91   Temp (!) 96.4 F (35.8 C) (Tympanic)   Resp 16   Ht 5\' 2"  (1.575 m)   Wt 94 lb (42.6 kg)   SpO2 100%   BMI 17.19 kg/m    Body mass index is 17.19 kg/m.    ECOG FS:0 - Asymptomatic  General: Well-developed, well-nourished, no acute distress.  Sitting in a wheelchair. Eyes: Pink conjunctiva, anicteric sclera. HEENT: Normocephalic, moist mucous membranes. Lungs: No audible wheezing or coughing. Heart: Regular rate and  rhythm. Abdomen: Soft, nontender, no obvious distention. Musculoskeletal: No edema, cyanosis, or clubbing. Neuro: Alert, answering all questions appropriately. Cranial nerves grossly intact. Skin: No rashes or petechiae noted. Psych: Normal affect.   LAB RESULTS:  Appointment on 02/24/2023  Component Date Value Ref Range Status   WBC Count 02/24/2023 5.5  4.0 - 10.5 K/uL Final   RBC 02/24/2023 3.95  3.87 - 5.11 MIL/uL Final   Hemoglobin 02/24/2023 13.5  12.0 - 15.0 g/dL Final   HCT 16/08/9603 40.6  36.0 - 46.0 % Final   MCV 02/24/2023 102.8 (H)  80.0 - 100.0 fL Final   MCH 02/24/2023 34.2 (H)  26.0 - 34.0 pg Final   MCHC 02/24/2023 33.3  30.0 - 36.0 g/dL Final   RDW 16/08/9603 13.2  11.5 - 15.5 % Final   Platelet Count 02/24/2023 280  150 - 400 K/uL Final   nRBC 02/24/2023 0.0  0.0 - 0.2 % Final   Neutrophils Relative % 02/24/2023 56  % Final   Neutro Abs 02/24/2023 3.1  1.7 - 7.7 K/uL Final   Lymphocytes Relative 02/24/2023 30  % Final   Lymphs Abs 02/24/2023 1.6  0.7 - 4.0 K/uL Final   Monocytes Relative 02/24/2023 11  % Final   Monocytes Absolute 02/24/2023 0.6  0.1 - 1.0 K/uL Final   Eosinophils Relative 02/24/2023 2  % Final   Eosinophils Absolute 02/24/2023 0.1  0.0 - 0.5 K/uL Final   Basophils Relative 02/24/2023 1  % Final   Basophils Absolute 02/24/2023 0.0  0.0 - 0.1 K/uL Final   Immature Granulocytes 02/24/2023 0  % Final   Abs Immature Granulocytes 02/24/2023 0.02  0.00 - 0.07 K/uL Final   Performed at [facility], [address] [place], [place] [address]   Sodium 02/24/2023 132 (L)  135 - 145 mmol/L Final   Potassium 02/24/2023 3.8  3.5 - 5.1 mmol/L Final   Chloride 02/24/2023 94 (L)  98 - 111 mmol/L Final   CO2 02/24/2023 28  22 - 32 mmol/L Final   Glucose, Bld 02/24/2023 116 (H)  70 - 99 mg/dL Final   Glucose reference range applies only to samples taken after fasting for at least 8 hours.   BUN 02/24/2023 14  8 - 23 mg/dL Final   Creatinine  11/91/4782 0.58  0.44 - 1.00 mg/dL Final   Calcium 95/62/1308 9.6  8.9 - 10.3 mg/dL Final   GFR, Estimated 02/24/2023 >60  >60 mL/min Final   Comment: (NOTE) Calculated using the CKD-EPI Creatinine Equation (2021)    Anion gap 02/24/2023 10  5 - 15 Final   Performed at [facility], [address] [place], [place] [address]    STUDIES: No results found.  ASSESSMENT: Pathologic stage Ia adenocarcinoma of the lower inner quadrant of the right breast.  PLAN:    Pathologic stage Ia adenocarcinoma of the lower inner quadrant of the right breast: Patient is status post right mastectomy in November 2015. Her Oncotype score of 22 was intermediate risk, but patient did not receive chemotherapy.  Letrozole was discontinued secondary to osteoporosis and patient was initiated on tamoxifen.  Patient completed 7 years of treatment in approximately November 2022.  She had a left breast screening mammogram on September 13, 2021 that was reported as BI-RADS 1.  Patient will require repeat screening mammogram in the near future.   Osteoporosis: Patient's most recent bone mineral density on February 17, 2022 reported T score of -2.6.  Proceed with Prolia today.  Continue calcium and vitamin D supplementation.  Repeat bone mineral density in the next 1 to 2 weeks.  Return to clinic in 6 months for routine evaluation and continuation of treatment. Hypertension: Patient was given a prescription for her carvedilol today.  She has been instructed that she will no longer receive refills from this clinic and that she needs to follow-up with primary care. Anxiety: Patient was also given a prescription for Klonopin today.  No further refills from this clinic.  Follow-up with primary care as above.  Patient expressed understanding and was in agreement with this plan. She also understands that She can call clinic at any time with any questions, concerns, or complaints.  Jeralyn Ruths, MD   02/24/2023 12:23  PM

## 2023-02-24 NOTE — Progress Notes (Signed)
Wants to talk about her altered taste.

## 2023-03-18 ENCOUNTER — Other Ambulatory Visit: Payer: Self-pay | Admitting: Oncology

## 2023-03-20 ENCOUNTER — Encounter: Payer: Self-pay | Admitting: Oncology

## 2023-06-08 ENCOUNTER — Other Ambulatory Visit: Payer: Medicare Other

## 2023-08-28 ENCOUNTER — Ambulatory Visit: Payer: Medicare Other | Admitting: Oncology

## 2023-08-28 ENCOUNTER — Ambulatory Visit: Payer: Medicare Other

## 2023-08-28 ENCOUNTER — Other Ambulatory Visit: Payer: Medicare Other
